# Patient Record
Sex: Male | Born: 1945 | Race: White | Hispanic: No | Smoking: Former smoker
Health system: Southern US, Community
[De-identification: ages and names within clinical notes are randomized; demographics above are authoritative.]

## PROBLEM LIST (undated history)

## (undated) DIAGNOSIS — K219 Gastro-esophageal reflux disease without esophagitis: Secondary | ICD-10-CM

## (undated) DIAGNOSIS — I82409 Acute embolism and thrombosis of unspecified deep veins of unspecified lower extremity: Secondary | ICD-10-CM

## (undated) DIAGNOSIS — K648 Other hemorrhoids: Secondary | ICD-10-CM

## (undated) DIAGNOSIS — S9781XA Crushing injury of right foot, initial encounter: Secondary | ICD-10-CM

## (undated) DIAGNOSIS — I10 Essential (primary) hypertension: Secondary | ICD-10-CM

## (undated) DIAGNOSIS — R42 Dizziness and giddiness: Secondary | ICD-10-CM

## (undated) DIAGNOSIS — Z8616 Personal history of COVID-19: Secondary | ICD-10-CM

## (undated) DIAGNOSIS — G500 Trigeminal neuralgia: Secondary | ICD-10-CM

## (undated) DIAGNOSIS — C4492 Squamous cell carcinoma of skin, unspecified: Secondary | ICD-10-CM

## (undated) DIAGNOSIS — Z973 Presence of spectacles and contact lenses: Secondary | ICD-10-CM

## (undated) DIAGNOSIS — Z9889 Other specified postprocedural states: Secondary | ICD-10-CM

## (undated) DIAGNOSIS — C4491 Basal cell carcinoma of skin, unspecified: Secondary | ICD-10-CM

## (undated) DIAGNOSIS — D649 Anemia, unspecified: Secondary | ICD-10-CM

## (undated) DIAGNOSIS — K5790 Diverticulosis of intestine, part unspecified, without perforation or abscess without bleeding: Secondary | ICD-10-CM

## (undated) DIAGNOSIS — S62101A Fracture of unspecified carpal bone, right wrist, initial encounter for closed fracture: Secondary | ICD-10-CM

## (undated) DIAGNOSIS — R112 Nausea with vomiting, unspecified: Secondary | ICD-10-CM

## (undated) HISTORY — PX: COLONOSCOPY: SHX174

## (undated) HISTORY — DX: Gastro-esophageal reflux disease without esophagitis: K21.9

## (undated) HISTORY — PX: VENOGRAM: SHX6165

## (undated) HISTORY — DX: Squamous cell carcinoma of skin, unspecified: C44.92

## (undated) HISTORY — PX: ESOPHAGOGASTRODUODENOSCOPY: SHX1529

## (undated) HISTORY — DX: Trigeminal neuralgia: G50.0

## (undated) HISTORY — DX: Other hemorrhoids: K64.8

## (undated) HISTORY — DX: Basal cell carcinoma of skin, unspecified: C44.91

## (undated) HISTORY — PX: UPPER GASTROINTESTINAL ENDOSCOPY: SHX188

## (undated) HISTORY — DX: Fracture of unspecified carpal bone, right wrist, initial encounter for closed fracture: S62.101A

## (undated) HISTORY — PX: WRIST SURGERY: SHX841

## (undated) HISTORY — DX: Acute embolism and thrombosis of unspecified deep veins of unspecified lower extremity: I82.409

## (undated) HISTORY — DX: Crushing injury of right foot, initial encounter: S97.81XA

## (undated) HISTORY — DX: Personal history of COVID-19: Z86.16

## (undated) HISTORY — PX: REPAIR ANKLE LIGAMENT: SUR1187

## (undated) HISTORY — DX: Essential (primary) hypertension: I10

## (undated) HISTORY — DX: Diverticulosis of intestine, part unspecified, without perforation or abscess without bleeding: K57.90

## (undated) HISTORY — PX: POLYPECTOMY: SHX149

---
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
## 1992-04-05 DIAGNOSIS — K649 Unspecified hemorrhoids: Secondary | ICD-10-CM | POA: Insufficient documentation

## 1996-04-05 DIAGNOSIS — G43009 Migraine without aura, not intractable, without status migrainosus: Secondary | ICD-10-CM | POA: Insufficient documentation

## 2000-03-25 DIAGNOSIS — S9781XA Crushing injury of right foot, initial encounter: Secondary | ICD-10-CM

## 2000-03-25 HISTORY — DX: Crushing injury of right foot, initial encounter: S97.81XA

## 2000-04-05 DIAGNOSIS — S62101A Fracture of unspecified carpal bone, right wrist, initial encounter for closed fracture: Secondary | ICD-10-CM

## 2000-04-05 HISTORY — DX: Fracture of unspecified carpal bone, right wrist, initial encounter for closed fracture: S62.101A

## 2000-04-21 ENCOUNTER — Encounter: Admission: RE | Admit: 2000-04-21 | Discharge: 2000-04-21 | Payer: Self-pay | Admitting: Occupational Medicine

## 2000-04-21 ENCOUNTER — Encounter: Payer: Self-pay | Admitting: Occupational Medicine

## 2000-09-25 ENCOUNTER — Observation Stay (HOSPITAL_COMMUNITY): Admission: AC | Admit: 2000-09-25 | Discharge: 2000-09-26 | Payer: Self-pay

## 2000-09-25 ENCOUNTER — Encounter: Payer: Self-pay | Admitting: Orthopedic Surgery

## 2000-09-29 ENCOUNTER — Observation Stay (HOSPITAL_COMMUNITY): Admission: RE | Admit: 2000-09-29 | Discharge: 2000-09-30 | Payer: Self-pay | Admitting: Orthopedic Surgery

## 2000-09-29 ENCOUNTER — Encounter: Payer: Self-pay | Admitting: Orthopedic Surgery

## 2002-08-27 LAB — HM COLONOSCOPY

## 2003-12-05 ENCOUNTER — Encounter: Payer: Self-pay | Admitting: Family Medicine

## 2003-12-05 LAB — CONVERTED CEMR LAB: PSA: 1.9 ng/mL

## 2004-02-24 ENCOUNTER — Ambulatory Visit: Payer: Self-pay | Admitting: Family Medicine

## 2004-05-26 ENCOUNTER — Ambulatory Visit: Payer: Self-pay | Admitting: Family Medicine

## 2004-07-23 ENCOUNTER — Ambulatory Visit: Payer: Self-pay | Admitting: Family Medicine

## 2004-07-29 ENCOUNTER — Ambulatory Visit: Payer: Self-pay | Admitting: Family Medicine

## 2004-12-04 ENCOUNTER — Encounter: Payer: Self-pay | Admitting: Family Medicine

## 2004-12-04 LAB — CONVERTED CEMR LAB: PSA: 1.91 ng/mL

## 2004-12-21 ENCOUNTER — Ambulatory Visit: Payer: Self-pay | Admitting: Family Medicine

## 2004-12-21 LAB — CONVERTED CEMR LAB
RBC count: 4.9 10*6/mm3
WBC, blood: 4.2 10*3/mm3

## 2004-12-22 ENCOUNTER — Ambulatory Visit: Payer: Self-pay | Admitting: Family Medicine

## 2005-01-01 ENCOUNTER — Ambulatory Visit: Payer: Self-pay | Admitting: Family Medicine

## 2005-01-21 ENCOUNTER — Inpatient Hospital Stay (HOSPITAL_COMMUNITY): Admission: EM | Admit: 2005-01-21 | Discharge: 2005-01-23 | Payer: Self-pay | Admitting: Emergency Medicine

## 2005-02-04 ENCOUNTER — Inpatient Hospital Stay (HOSPITAL_COMMUNITY): Admission: RE | Admit: 2005-02-04 | Discharge: 2005-02-06 | Payer: Self-pay | Admitting: Orthopedic Surgery

## 2005-06-09 ENCOUNTER — Ambulatory Visit: Payer: Self-pay | Admitting: Family Medicine

## 2005-07-01 ENCOUNTER — Ambulatory Visit: Payer: Self-pay | Admitting: Family Medicine

## 2005-07-03 ENCOUNTER — Emergency Department (HOSPITAL_COMMUNITY): Admission: EM | Admit: 2005-07-03 | Discharge: 2005-07-03 | Payer: Self-pay | Admitting: Emergency Medicine

## 2006-01-17 ENCOUNTER — Ambulatory Visit: Payer: Self-pay | Admitting: Family Medicine

## 2006-01-17 LAB — CONVERTED CEMR LAB
PSA: 2.03 ng/mL
TSH: 1.9 microintl units/mL

## 2006-01-19 ENCOUNTER — Ambulatory Visit: Payer: Self-pay | Admitting: Family Medicine

## 2006-01-24 ENCOUNTER — Ambulatory Visit: Payer: Self-pay | Admitting: Family Medicine

## 2006-01-27 ENCOUNTER — Ambulatory Visit: Payer: Self-pay | Admitting: Family Medicine

## 2006-01-31 ENCOUNTER — Ambulatory Visit: Payer: Self-pay | Admitting: Family Medicine

## 2006-02-03 ENCOUNTER — Ambulatory Visit: Payer: Self-pay | Admitting: Family Medicine

## 2006-02-04 ENCOUNTER — Ambulatory Visit: Payer: Self-pay | Admitting: Family Medicine

## 2006-02-16 ENCOUNTER — Ambulatory Visit: Payer: Self-pay | Admitting: Family Medicine

## 2006-03-02 ENCOUNTER — Ambulatory Visit: Payer: Self-pay | Admitting: Family Medicine

## 2006-03-25 ENCOUNTER — Ambulatory Visit: Payer: Self-pay | Admitting: Family Medicine

## 2006-06-24 ENCOUNTER — Encounter: Payer: Self-pay | Admitting: Family Medicine

## 2006-06-24 DIAGNOSIS — H9190 Unspecified hearing loss, unspecified ear: Secondary | ICD-10-CM | POA: Insufficient documentation

## 2006-06-24 DIAGNOSIS — G250 Essential tremor: Secondary | ICD-10-CM | POA: Insufficient documentation

## 2006-06-24 DIAGNOSIS — B009 Herpesviral infection, unspecified: Secondary | ICD-10-CM | POA: Insufficient documentation

## 2006-06-24 DIAGNOSIS — G252 Other specified forms of tremor: Secondary | ICD-10-CM | POA: Insufficient documentation

## 2006-06-24 DIAGNOSIS — K219 Gastro-esophageal reflux disease without esophagitis: Secondary | ICD-10-CM | POA: Insufficient documentation

## 2006-06-24 DIAGNOSIS — I10 Essential (primary) hypertension: Secondary | ICD-10-CM | POA: Insufficient documentation

## 2006-06-24 DIAGNOSIS — H81399 Other peripheral vertigo, unspecified ear: Secondary | ICD-10-CM | POA: Insufficient documentation

## 2006-12-12 ENCOUNTER — Ambulatory Visit: Payer: Self-pay | Admitting: Gastroenterology

## 2006-12-26 ENCOUNTER — Encounter: Payer: Self-pay | Admitting: Family Medicine

## 2006-12-26 ENCOUNTER — Encounter (INDEPENDENT_AMBULATORY_CARE_PROVIDER_SITE_OTHER): Payer: Self-pay | Admitting: *Deleted

## 2006-12-26 ENCOUNTER — Encounter: Payer: Self-pay | Admitting: Gastroenterology

## 2006-12-26 ENCOUNTER — Ambulatory Visit: Payer: Self-pay | Admitting: Gastroenterology

## 2006-12-26 DIAGNOSIS — K5730 Diverticulosis of large intestine without perforation or abscess without bleeding: Secondary | ICD-10-CM | POA: Insufficient documentation

## 2007-01-10 ENCOUNTER — Encounter: Payer: Self-pay | Admitting: Family Medicine

## 2007-01-10 DIAGNOSIS — K620 Anal polyp: Secondary | ICD-10-CM | POA: Insufficient documentation

## 2007-01-10 DIAGNOSIS — K621 Rectal polyp: Secondary | ICD-10-CM

## 2007-01-27 ENCOUNTER — Ambulatory Visit: Payer: Self-pay | Admitting: Family Medicine

## 2007-01-30 LAB — CONVERTED CEMR LAB
ALT: 25 units/L (ref 0–53)
AST: 20 units/L (ref 0–37)
Albumin: 4.3 g/dL (ref 3.5–5.2)
Alkaline Phosphatase: 60 units/L (ref 39–117)
BUN: 16 mg/dL (ref 6–23)
Bilirubin, Direct: 0.2 mg/dL (ref 0.0–0.3)
CO2: 29 meq/L (ref 19–32)
Calcium: 9.6 mg/dL (ref 8.4–10.5)
Chloride: 107 meq/L (ref 96–112)
Cholesterol: 212 mg/dL (ref 0–200)
Creatinine, Ser: 1 mg/dL (ref 0.4–1.5)
Direct LDL: 140.1 mg/dL
GFR calc Af Amer: 98 mL/min
GFR calc non Af Amer: 81 mL/min
Glucose, Bld: 103 mg/dL — ABNORMAL HIGH (ref 70–99)
HDL: 59.3 mg/dL (ref >39.0)
PSA: 2.45 ng/mL (ref 0.10–4.00)
Potassium: 4.7 meq/L (ref 3.5–5.1)
Sodium: 141 meq/L (ref 135–145)
TSH: 1.62 microintl units/mL (ref 0.35–5.50)
Total Bilirubin: 1.1 mg/dL (ref 0.3–1.2)
Total CHOL/HDL Ratio: 3.6
Total Protein: 6.9 g/dL (ref 6.0–8.3)
Triglycerides: 46 mg/dL (ref 0–149)
VLDL: 9 mg/dL (ref 0–40)

## 2007-01-31 ENCOUNTER — Ambulatory Visit: Payer: Self-pay | Admitting: Family Medicine

## 2007-01-31 DIAGNOSIS — E785 Hyperlipidemia, unspecified: Secondary | ICD-10-CM | POA: Insufficient documentation

## 2007-02-21 ENCOUNTER — Ambulatory Visit: Payer: Self-pay | Admitting: Family Medicine

## 2007-02-22 ENCOUNTER — Encounter (INDEPENDENT_AMBULATORY_CARE_PROVIDER_SITE_OTHER): Payer: Self-pay | Admitting: *Deleted

## 2007-05-02 ENCOUNTER — Ambulatory Visit: Payer: Self-pay | Admitting: Family Medicine

## 2007-05-02 LAB — CONVERTED CEMR LAB
BUN: 14 mg/dL (ref 6–23)
CO2: 32 meq/L (ref 19–32)
Calcium: 9.4 mg/dL (ref 8.4–10.5)
Chloride: 106 meq/L (ref 96–112)
Creatinine, Ser: 1.1 mg/dL (ref 0.4–1.5)
GFR calc Af Amer: 88 mL/min
GFR calc non Af Amer: 73 mL/min
Glucose, Bld: 98 mg/dL (ref 70–99)
Potassium: 4.3 meq/L (ref 3.5–5.1)
Sodium: 141 meq/L (ref 135–145)

## 2007-05-04 ENCOUNTER — Ambulatory Visit: Payer: Self-pay | Admitting: Family Medicine

## 2007-11-15 ENCOUNTER — Ambulatory Visit: Payer: Self-pay | Admitting: Internal Medicine

## 2007-11-23 ENCOUNTER — Ambulatory Visit: Payer: Self-pay | Admitting: Family Medicine

## 2008-02-05 ENCOUNTER — Ambulatory Visit: Payer: Self-pay | Admitting: Family Medicine

## 2008-02-05 LAB — CONVERTED CEMR LAB
ALT: 25 units/L (ref 0–53)
AST: 24 units/L (ref 0–37)
Albumin: 4.1 g/dL (ref 3.5–5.2)
Alkaline Phosphatase: 48 units/L (ref 39–117)
BUN: 18 mg/dL (ref 6–23)
Basophils Absolute: 0 10*3/uL (ref 0.0–0.1)
Basophils Relative: 1.1 % (ref 0.0–3.0)
Bilirubin, Direct: 0.2 mg/dL (ref 0.0–0.3)
CO2: 30 meq/L (ref 19–32)
Calcium: 9.1 mg/dL (ref 8.4–10.5)
Chloride: 108 meq/L (ref 96–112)
Cholesterol: 174 mg/dL (ref 0–200)
Creatinine, Ser: 1.1 mg/dL (ref 0.4–1.5)
Eosinophils Absolute: 0.2 10*3/uL (ref 0.0–0.7)
Eosinophils Relative: 5.9 % — ABNORMAL HIGH (ref 0.0–5.0)
GFR calc Af Amer: 88 mL/min
GFR calc non Af Amer: 73 mL/min
Glucose, Bld: 106 mg/dL — ABNORMAL HIGH (ref 70–99)
HCT: 43.4 % (ref 39.0–52.0)
HDL: 55.1 mg/dL (ref >39.0)
Hemoglobin: 15.1 g/dL (ref 13.0–17.0)
Iron: 85 ug/dL (ref 42–165)
LDL Cholesterol: 111 mg/dL — ABNORMAL HIGH (ref 0–99)
Lymphocytes Relative: 31.9 % (ref 12.0–46.0)
MCHC: 34.9 g/dL (ref 30.0–36.0)
MCV: 85.3 fL (ref 78.0–100.0)
Monocytes Absolute: 0.4 10*3/uL (ref 0.1–1.0)
Monocytes Relative: 10 % (ref 3.0–12.0)
Neutro Abs: 2.3 10*3/uL (ref 1.4–7.7)
Neutrophils Relative %: 51.1 % (ref 43.0–77.0)
PSA: 2.43 ng/mL (ref 0.10–4.00)
Platelets: 164 10*3/uL (ref 150–400)
Potassium: 4.4 meq/L (ref 3.5–5.1)
RBC: 5.09 M/uL (ref 4.22–5.81)
RDW: 12.4 % (ref 11.5–14.6)
Sodium: 144 meq/L (ref 135–145)
TSH: 1.21 microintl units/mL (ref 0.35–5.50)
Total Bilirubin: 1.1 mg/dL (ref 0.3–1.2)
Total CHOL/HDL Ratio: 3.2
Total Protein: 6.3 g/dL (ref 6.0–8.3)
Triglycerides: 41 mg/dL (ref 0–149)
VLDL: 8 mg/dL (ref 0–40)
WBC: 4.2 10*3/uL — ABNORMAL LOW (ref 4.5–10.5)

## 2008-02-07 ENCOUNTER — Ambulatory Visit: Payer: Self-pay | Admitting: Family Medicine

## 2008-02-13 ENCOUNTER — Ambulatory Visit: Payer: Self-pay | Admitting: Family Medicine

## 2008-04-07 ENCOUNTER — Emergency Department (HOSPITAL_COMMUNITY): Admission: EM | Admit: 2008-04-07 | Discharge: 2008-04-07 | Payer: Self-pay | Admitting: Emergency Medicine

## 2008-05-07 ENCOUNTER — Ambulatory Visit: Payer: Self-pay | Admitting: Family Medicine

## 2008-12-10 ENCOUNTER — Ambulatory Visit: Payer: Self-pay | Admitting: Family Medicine

## 2008-12-10 DIAGNOSIS — K921 Melena: Secondary | ICD-10-CM | POA: Insufficient documentation

## 2009-02-07 ENCOUNTER — Ambulatory Visit: Payer: Self-pay | Admitting: Family Medicine

## 2009-02-07 LAB — CONVERTED CEMR LAB
ALT: 26 units/L (ref 0–53)
AST: 23 units/L (ref 0–37)
Albumin: 4.3 g/dL (ref 3.5–5.2)
Alkaline Phosphatase: 54 units/L (ref 39–117)
BUN: 12 mg/dL (ref 6–23)
Basophils Absolute: 0 10*3/uL (ref 0.0–0.1)
Basophils Relative: 0.8 % (ref 0.0–3.0)
Bilirubin, Direct: 0.1 mg/dL (ref 0.0–0.3)
CO2: 27 meq/L (ref 19–32)
Calcium: 9.1 mg/dL (ref 8.4–10.5)
Chloride: 111 meq/L (ref 96–112)
Cholesterol: 191 mg/dL (ref 0–200)
Creatinine, Ser: 1 mg/dL (ref 0.4–1.5)
Eosinophils Absolute: 0.2 10*3/uL (ref 0.0–0.7)
Eosinophils Relative: 6.6 % — ABNORMAL HIGH (ref 0.0–5.0)
GFR calc non Af Amer: 80.54 mL/min (ref >60)
Glucose, Bld: 97 mg/dL (ref 70–99)
HCT: 43.6 % (ref 39.0–52.0)
HDL: 64.6 mg/dL (ref >39.00)
Hemoglobin: 15.1 g/dL (ref 13.0–17.0)
LDL Cholesterol: 117 mg/dL — ABNORMAL HIGH (ref 0–99)
Lymphocytes Relative: 29.7 % (ref 12.0–46.0)
Lymphs Abs: 1.1 10*3/uL (ref 0.7–4.0)
MCHC: 34.6 g/dL (ref 30.0–36.0)
MCV: 90.3 fL (ref 78.0–100.0)
Monocytes Absolute: 0.3 10*3/uL (ref 0.1–1.0)
Monocytes Relative: 9.7 % (ref 3.0–12.0)
Neutro Abs: 2 10*3/uL (ref 1.4–7.7)
Neutrophils Relative %: 53.2 % (ref 43.0–77.0)
PSA: 2.46 ng/mL (ref 0.10–4.00)
Platelets: 159 10*3/uL (ref 150.0–400.0)
Potassium: 4.5 meq/L (ref 3.5–5.1)
RBC: 4.82 M/uL (ref 4.22–5.81)
RDW: 12.2 % (ref 11.5–14.6)
Sodium: 145 meq/L (ref 135–145)
TSH: 1.47 microintl units/mL (ref 0.35–5.50)
Total Bilirubin: 1.2 mg/dL (ref 0.3–1.2)
Total CHOL/HDL Ratio: 3
Total Protein: 6.8 g/dL (ref 6.0–8.3)
Triglycerides: 46 mg/dL (ref 0.0–149.0)
VLDL: 9.2 mg/dL (ref 0.0–40.0)
WBC: 3.6 10*3/uL — ABNORMAL LOW (ref 4.5–10.5)

## 2009-02-09 LAB — CONVERTED CEMR LAB: Vit D, 25-Hydroxy: 39 ng/mL (ref 30–89)

## 2009-02-10 ENCOUNTER — Ambulatory Visit: Payer: Self-pay | Admitting: Family Medicine

## 2009-02-26 ENCOUNTER — Ambulatory Visit: Payer: Self-pay | Admitting: Family Medicine

## 2009-02-26 LAB — CONVERTED CEMR LAB
OCCULT 1: NEGATIVE
OCCULT 2: NEGATIVE
OCCULT 3: NEGATIVE

## 2009-11-10 ENCOUNTER — Encounter (INDEPENDENT_AMBULATORY_CARE_PROVIDER_SITE_OTHER): Payer: Self-pay | Admitting: *Deleted

## 2010-02-13 ENCOUNTER — Telehealth (INDEPENDENT_AMBULATORY_CARE_PROVIDER_SITE_OTHER): Payer: Self-pay | Admitting: *Deleted

## 2010-02-17 ENCOUNTER — Ambulatory Visit: Payer: Self-pay | Admitting: Family Medicine

## 2010-02-18 LAB — CONVERTED CEMR LAB
ALT: 20 units/L (ref 0–53)
AST: 20 units/L (ref 0–37)
Albumin: 4.4 g/dL (ref 3.5–5.2)
Alkaline Phosphatase: 61 units/L (ref 39–117)
BUN: 19 mg/dL (ref 6–23)
Bilirubin, Direct: 0.1 mg/dL (ref 0.0–0.3)
CO2: 30 meq/L (ref 19–32)
Calcium: 9.3 mg/dL (ref 8.4–10.5)
Chloride: 106 meq/L (ref 96–112)
Cholesterol: 200 mg/dL (ref 0–200)
Creatinine, Ser: 1 mg/dL (ref 0.4–1.5)
GFR calc non Af Amer: 78.46 mL/min (ref >60)
Glucose, Bld: 99 mg/dL (ref 70–99)
HDL: 61 mg/dL (ref >39.00)
LDL Cholesterol: 127 mg/dL — ABNORMAL HIGH (ref 0–99)
PSA: 3.19 ng/mL (ref 0.10–4.00)
Potassium: 4.6 meq/L (ref 3.5–5.1)
Sodium: 140 meq/L (ref 135–145)
Total Bilirubin: 1 mg/dL (ref 0.3–1.2)
Total CHOL/HDL Ratio: 3
Total Protein: 6.7 g/dL (ref 6.0–8.3)
Triglycerides: 61 mg/dL (ref 0.0–149.0)
VLDL: 12.2 mg/dL (ref 0.0–40.0)

## 2010-02-24 ENCOUNTER — Ambulatory Visit: Payer: Self-pay | Admitting: Family Medicine

## 2010-02-24 DIAGNOSIS — R6884 Jaw pain: Secondary | ICD-10-CM | POA: Insufficient documentation

## 2010-02-24 LAB — CONVERTED CEMR LAB
Cholesterol, target level: 200 mg/dL
HDL goal, serum: 40 mg/dL
LDL Goal: 160 mg/dL
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
## 2010-05-05 NOTE — Letter (Signed)
Summary: Nadara Eaton letter  [place] at [facility]  [address] [place], [place] [address]   Phone: [phone]  Fax: [phone]       11/10/2009 MRN: [id number]  LOEL BETANCUR [address] [place], [place]  [address]  Dear Mr. Judithann Sheen [facility] [place], and [facility] announce the retirement of Arta Silence, M.D., from full-time practice at the [facility] office effective October 02, 2009 and his plans of returning part-time.  It is important to Dr. Hetty Ely and to our practice that you understand that [facility] has seven physicians in our office for your health care needs.  We will continue to offer the same exceptional care that you have today.    Dr. Hetty Ely has spoken to many of you about his plans for retirement and returning part-time in the fall.   We will continue to work with you through the transition to schedule appointments for you in the office and meet the high standards that [facility] is committed to.   Again, it is with great pleasure that we share the news that Dr. Hetty Ely will return to [facility] at [facility] in October of 2011 with a reduced schedule.    If you have any questions, or would like to request an appointment with one of our physicians, please call us at [phone] and press the option for Scheduling an appointment.  We take pleasure in providing you with excellent patient care and look forward to seeing you at your next office visit.  Our [facility] Physicians are:  Tillman Abide, M.D. Laurita Quint, M.D. Roxy Manns, M.D. Kerby Nora, M.D. Hannah Beat, M.D. Ruthe Mannan, M.D. We proudly welcomed Raechel Ache, M.D. and Eustaquio Boyden, M.D. to the practice in July/August 2011.  Sincerely,   [facility]

## 2010-05-05 NOTE — Progress Notes (Signed)
----   Converted from flag ---- ---- 02/12/2010 1:51 PM, Crawford Givens MD wrote: psa v76.44 cmet/lipid 401.1  ---- 02/12/2010 1:49 PM, Liane Comber CMA (AAMA) wrote: Lab orders please! Good Morning! This pt is scheduled for cpx labs Tuesday, which labs to draw and dx codes to use? Thanks Tasha ------------------------------

## 2010-08-21 NOTE — Consult Note (Signed)
NAMETHANOS, COUSINEAU NO.:  [id number]   MEDICAL RECORD NO.:  [id number]          PATIENT TYPE:  INP   LOCATION:  6707                         FACILITY:  [facility]   PHYSICIAN:  Doralee Albino. Carola Frost, M.D. DATE OF BIRTH:  1945-12-13   DATE OF CONSULTATION:  01/21/2005  DATE OF DISCHARGE:                                   CONSULTATION   REQUESTING PHYSICIAN:  Patsy Lager, M.D.   REASON FOR CONSULTATION:  Right pilon fracture with associated fibular  fracture.   BRIEF HISTORY OF PRESENTATION:  Mr. Basaldua is a 66 year old white male who  fell about 12 feet off a ladder while working on his home which is under  Holiday representative today. He had immediate onset of pain in the right ankle. He  denied any other injuries or any numbness or tingling associated with this.  There was no break in the skin according to the previous workup by Dr.  Ranell Patrick' physician assistant. He specifically denied back and hip pain. His  pain is moderate at this time. He did not have any particular pain with  passive stretch.   PAST MEDICAL HISTORY:  Hypertension.   PAST SURGICAL HISTORY:  Wrist fracture repair by Dr. Amanda Pea.   MEDICATIONS:  Diovan.   ALLERGIES:  CODEINE.   REVIEW OF SYSTEMS:  Complete review of systems was reviewed. That was  included in his chart.   FAMILY MEDICAL HISTORY:  Noncontributory.   SOCIAL HISTORY:  The patient denies smoking. He is currently retired but  actively engaged in many projects. He presents with his wife.   PHYSICAL EXAMINATION:  Mr. Patty appears comfortable. He is not in any  distress. He is conversant, alert and oriented. Vital signs are stable. He  does not have any tenderness to palpation of his pelvic girdle. No  tenderness about the hips or knees and no associated swelling or blocks to  motion of the knees bilaterally. Similarly, his left heel and ankle without  focal swelling, instability or blocked motion. The right ankle has been  immobilized in a bulky Jones dressing and is elevated above his heart. The  patient is able to demonstrate active flexion and extension of the great and  lesser toes. He did not have any significant pain with passive stretch.  Capillary refill is 2 seconds and brisk.   Three views of the left ankle demonstrate impacted pilon fracture and  associated fibular fracture. The fibular fracture is transverse, shortened 3  mm or so and translated about half the shaft posteriorly. The tibial plafond  has been fractured with anterior displacement of the anterolateral fragment,  disruption of the syndesmosis and displacement of medial malleolus. There  are multiple comminuted interarticular fragments, but on the whole, no other  major fracture lines are identified up. These findings were well visualized  on review of multiple CT scan images.   ASSESSMENT:  1.  Right pilon and fibular fracture.  2.  Ruptured syndesmosis.  3.  No evidence of other injury at this time.   PLAN:  I agree wholeheartedly with Dr. Ranell Patrick' plan to admit Mr.  Stucki  overnight for observation and elevation of his extremity, and pain control.  Should it be sufficiently controlled on all narcotics, discharge is  anticipated tomorrow. I also have counseled him regarding the possibility of  thromboembolic disease and complications and have recommended proceeding  with Lovenox if this is okay with Dr. Ranell Patrick. I have gone ahead and written  him a prescription for 20 days that should facilitate both the time period  presurgery as well as postoperative. I will see him back in clinic next  week. We will discuss surgery further at that time if his soft tissues  allow. He will of course maintain nonweightbearing and elevation of the  extremity pending the opportunity for surgical fixation. At this time, I  would anticipate surgery to entail anterolateral plate and probably a  posterior plating of the fibula with separate internal  fixation of the  medial malleolus. I have discussed this as well as the possible  complications with the patient including skin breakdown, infection and  arthritis. We will discuss these in more detail when he returns to clinic.      Doralee Albino. Carola Frost, M.D.  Electronically Signed     MHH/MEDQ  D:  01/21/2005  T:  01/22/2005  Job:  [id number]

## 2010-08-21 NOTE — Discharge Summary (Signed)
NAMEGERRY, [name] NO.:  [id number]   MEDICAL RECORD NO.:  [id number]          PATIENT TYPE:  INP   LOCATION:  6707                         FACILITY:  [facility]   PHYSICIAN:  Almedia Balls. Ranell Patrick, M.D. DATE OF BIRTH:  06-26-1945   DATE OF ADMISSION:  01/21/2005  DATE OF DISCHARGE:  01/23/2005                                 DISCHARGE SUMMARY   ADMISSION DIAGNOSIS:  Right ankle pilon fracture.   DISCHARGE DIAGNOSIS:  Right ankle pilon fracture.   PROCEDURE PERFORMED:  Closed reduction and application of short-leg splint.   CONSULTING SERVICES:  Physical therapy and discharge planning.   HISTORY OF PRESENT ILLNESS:  The patient is a 65 year old male with a  history of a fall, injuring his ankle. The patient had radiographs  demonstrating a displaced pilon fracture. The patient is admitted for  further evaluation and treatment of that injury. For further details of the  patient's past medical history and physical examination please see the  medical record.   HOSPITAL COURSE:  The patient was admitted for pain control to the  orthopedic surgery service. He had a CT scan documenting displacement of a  right pilon fracture. This fracture will require operative stabilization.  The patient was informed. I discussed this case with Dr. Myrene Galas,  trauma specialist, who had agreed to take the patient on. Mr. Decoste had  an uncomplicated hospital course and he was given crutches and was  instructed to be nonweight bearing, to elevate his foot. He was given pain  medication. He will be discharged to home, to follow up with Dr. Carola Frost on  this coming Wednesday. The patient will begin to keep his ankle elevated and  will remain nonweight bearing.           ______________________________  Almedia Balls. Ranell Patrick, M.D.     SRN/MEDQ  D:  03/15/2005  T:  03/15/2005  Job:  [id number]

## 2010-08-21 NOTE — Discharge Summary (Signed)
NAMEDERRAL, COLUCCI NO.:  [id number]   MEDICAL RECORD NO.:  [id number]          PATIENT TYPE:  INP   LOCATION:  5006                         FACILITY:  [facility]   PHYSICIAN:  Doralee Albino. Carola Frost, M.D. DATE OF BIRTH:  19-Feb-1946   DATE OF ADMISSION:  02/04/2005  DATE OF DISCHARGE:  02/06/2005                                 DISCHARGE SUMMARY   ADMISSION DIAGNOSIS:  Right ankle pilon, fibula, and syndesmotic fracture.   DISCHARGE DIAGNOSIS:  Status post open reduction and internal fixation of  right ankle pilon, fibula, syndesmosis and talus osteochondral fracture.   PROCEDURE:  On February 04, 2005, ORIF of right ankle pilon, fibula, and  syndesmosis fracture and talus osteochondral fracture.   HISTORY OF PRESENT ILLNESS:  The patient was on a ladder and fell  approximately 12 feet, landing on his right lower extremity, sustaining a  severe injury to that extremity.  He underwent initial closed reduction and  splinting, with subsequent need for time for swelling to decrease to do  definitive open reduction and internal fixation on this patient.   PHYSICAL EXAMINATION ON ADMISSION:  GENERAL:  This is a well-appearing, well-  developed 65 year old white male in no acute distress.  HEENT:  Pupils equal, round, reactive to light and accommodation.  Extraocular movements intact.  Atraumatic, normocephalic.  NECK:  Supple.  CHEST:  Lung clear to auscultation bilaterally.  CARDIAC:  Regular rate and rhythm.  Normal S1 and S2.  ABDOMEN:  Positive bowel sounds.  Nontender and soft.  EXTREMITIES:  Distal neurovascular exam is intact.  Splint is in place in  the right lower extremity.  He does have 1+ edema.  SKIN:  Warm and dry.   HOSPITAL COURSE:  The patient underwent open reduction and internal fixation  on February 04, 2005 without complications.  On February 05, 2005, the patient  is seen and evaluated.  The patient did receive a block for his surgery.  This appears to  still be intact.  The patient has motor in his right lower  extremity; however, he has decreased sensory in the right lower extremity.  He is afebrile and vital signs are stable.  Splint is in place on  postoperative day #1.  Ace wrap in place.   Discharge planning is initiated today.  Anticipate discharge in the a.m. of  February 06, 2005.   DISPOSITION:  Discharged to home.   CONDITION ON DISCHARGE:  Stable.   DISCHARGE MEDICATIONS:  1.  Percocet.  2.  Oxycodone.  3.  Phenergan.  4.  Robaxin.  5.  Lovenox.   FOLLOW UP:  The patient is to follow up with Dr. Myrene Galas in  approximately 7-10 days.  Please call [phone] for appointment.  Please call  in the interim for any questions regarding this injury or concerns regarding  his right lower extremity.      Cecil Cranker, PA      Doralee Albino. Carola Frost, M.D.  Electronically Signed    RWC/MEDQ  D:  02/05/2005  T:  02/05/2005  Job:  [id number]

## 2010-08-21 NOTE — Op Note (Signed)
[name], [name]   PHYSICIAN:  Doralee Albino. Carola Frost, M.D. DATE OF BIRTH:  April 09, 1945   DATE OF PROCEDURE:  02/04/2005  DATE OF DISCHARGE:                                 OPERATIVE REPORT   PREOPERATIVE DIAGNOSES:  1.  Right tibial Pilon fracture.  2.  Right fibular fracture.  3.  Right ankle syndesmosis disruption.   POSTOPERATIVE DIAGNOSES:  1.  Right tibial Pilon fracture.  2.  Right fibular fracture.  3.  Right ankle syndesmosis disruption.   PROCEDURES:  1.  Open reduction and internal fixation, tibial plafond and fibula.  2.  Open reduction and internal fixation, syndesmosis.  3.  Open treatment of talus osteochondral fracture using drilling, partial      excision and microfracture.   SURGEON:  Myrene Galas, MD   ASSISTANT:  Peter Garter, PA   ANESTHESIA:  General.   COMPLICATIONS:  None.   TOURNIQUET:  None.   ESTIMATED BLOOD LOSS:  120 mL.   DRAINS:  One medium Hemovac.   DISPOSITION:  To PACU.   CONDITION:  Stable.   BRIEF SUMMARY OF INDICATION FOR PROCEDURE:  [name] is a very pleasant  a 65 year old male who sustained a high-energy right tibial Pilon fracture  and fibular fracture with a disruption of his syndesmosis on October19,  2006.  Since then, he has been temporized in a splint, allowing his soft  tissue swelling to resolve.  We recommended operative treatment of this  injury, given the disruption of the articular surface.  After a full  discussion of the risks and benefits of surgery, the patient wished to  proceed.  He understood those risks to include arthritis, nerve injury,  vessel injury, malunion, nonunion, infection, thromboembolic complications,  as well as the possible need for further surgery in the event of one of  these complications.   BRIEF DESCRIPTION OF  PROCEDURE:  [name] was administered preoperative  antibiotics and taken to the operating room, where general anesthesia was  induced and his right lower extremity prepped and draped in the usual  sterile fashion.  A tourniquet was placed about the thigh, but never  inflated during the case.  A 7-cm anterolateral incision was then made and  dissection carried carefully through the soft tissue superficially, where  the superficial peroneal nerve was identified and retracted medially.  Dissection continued deep, where the anterolateral fragment was palpable.  The periosteum was incised directly over the fracture and the fracture  fragments mobilized.  We could see immediately that we would have difficulty  bringing these fracture fragments down in spite of the partially articular  nature of the fracture.  A 0.62 K-wire was then driven through the calcaneus  from medial to lateral and a tension bow used to apply distraction.  The  fracture site was then opened with a lamina spreader and adherent hematoma  and clot removed.  There was a large osteochondral fracture then visible on  the talus involving the anterolateral dome.  A portion of the cartilage was  debrided sharply with a knife and then the exposed bone drilled several  times with the K-wire using the microfracture-type technique.  After  treatment of this osteochondral fracture, further debridement of the joint  was performed using bulb syringe and a tonsil to remove other fibrous  debris.   We were careful to debride the posterior aspect of the joint, given the CT  scan; this involved plantarflexion and removal with the Kocher knife.  After  removing these fragments, no other loose chondral flaps were visible on the  talus and the lesion appeared well-prepared for future healing.  The  periosteum was left attached distally to the anterolateral fragment.  We  then attempted to reduce this segment, but were unable to do so with  a  persistent distal lateral gap; consequently, the elevator had to be split  between the fibula and this segment, as it had healed in this distracted  position partially to the anterior aspect of the fibula.  Once this was  done, we were able to close this gap down fairly well to the articular  surface, there remained some minimal bone loss there which measured a couple  of millimeters and matched some of the completely unstable and devitalized  chondral segments that fell out at the arthrotomy and irrigation.  This was  keyed in and an attempted to be held with K-wires; we could not achieve  significant compression nor maintain it with the sharp tenaculum, given the  soft condition of the bone at this time.  Consequently, the near cortex was  overdrilled with a 0.35 drill and then a cancellus screw and washer used to  lag it into place; this resulted in a rather secure repair at that place.  The metaphyseal segment was keyed in anatomically using a dental pick and  then the anterolateral Synthes plate lagged down in buttress fashion  directly over this fragment with a standard 3.5 cortical screw through the  oblique long hole.  We confirmed appropriate placement on AP and lateral  images and then placed another series of lag screws through this  anterolateral fragment into the intact posterior cortex of the tibia using  cancellus screws.  These were sequentially tightened to achieve maximal  compression and buttress effect.  As one of these ended up being too long  after full compression, it was exchanged for a locked screw; 1 other locked  screw was placed.  Additional screws were then placed into the shaft segment  of the tibia.   The fibula was also plated through this wound, given that there was some  translation of the distal fragment by nearly 100%.  We were able to key in the reduction, which held rather nicely while we placed a 6-hole plate.  We  were very careful to watch for  the peroneal nerve during placement of this  including the deep branch.  A separate anteromedial incision was then made  using the hockey-stick shape to visualize the anteromedial corner, which was  reduced anatomically under direct visualization and held compressed with a  pointed tenaculum through a drill hole placed into the distal metaphysis.  Two partially threaded 35-mm screws were then inserted, resulting in solid  compression of these fragments.   Similarly from the CT scan, we knew that the syndesmosis was disrupted and a  sharp/sharp tenaculum was placed from the fibula to the tibia to reduce and  provisionally  hold the syndesmosis while 2 tricortical screws were placed  from lateral to medial through percutaneous stab holes.  Final AP and  lateral images were obtained showing appropriate reduction and hardware  placement, as well as mortise view.  The patient's wounds were then  irrigated and closed in standard layered fashion with Vicryl and nylon, as  well as staples for the main portion of the wound.  He was then awakened  from anesthesia and transported to the PACU in stable condition.   PROGNOSIS:  [name] has sustained a very severe injury to the articular  surface of his tibial plafond and also has a matching chondral lesion on his  talus.  We were able to restore appropriate articular width and stability.  Should this fracture go on to heal in the current position, he remains at  very high risk of degenerative arthritis and reduced range of motion.  He  will be on thrombo-prophylaxis with Lovenox and then transitioned to baby  aspirin 10 days after discharge.      Doralee Albino. Carola Frost, M.D.  Electronically Signed     MHH/MEDQ  D:  02/05/2005  T:  02/06/2005  Job:  [id number]

## 2010-08-21 NOTE — Op Note (Signed)
[facility]  Patient:    [name], [name]                       MRN: [id number] Proc. Date: 09/29/00 Adm. Date:  [id number] Attending:  Dominica Severin                           Operative Report  DATE OF BIRTH:  [birth year]  PREOPERATIVE DIAGNOSIS:  Comminuted complex intra-articular distal radial fracture, right upper extremity, with gross displacement.  POSTOPERATIVE DIAGNOSIS:  Comminuted complex intra-articular distal radial fracture, right upper extremity, with gross displacement.  PROCEDURE: 1. Open reduction and internal fixation with calcium phosphate bone grafting    from DePuy and K-wire fixation, right comminuted complex intra-articular    distal radius fracture. 2. Posterior interosseous nerve neurectomy. 3. External fixation application to the right upper extremity secondary to    comminuted complex intra-articular distal radius fracture, right upper    extremity.  SURGEON:  Elisha Ponder, M.D.  ASSISTANT:  Irena Cords, P.A.-C  ANESTHESIA:  General.  ESTIMATED BLOOD LOSS:  Minimal.  COMPLICATIONS:  None.  DRAINS:  One.  INDICATIONS FOR THE PROCEDURE:  Patient is a 65 year old white male previously seen approximately four days ago by myself in the emergency room.  He underwent provisional reduction under hematoma block.  CT scan and plain film were assessed and the above-mentioned findings were noted.  I counseled him in regards to risks and benefits of surgery including risks of infection, bleeding, anesthesia, damage to normal structures and failure of surgery to accomplish its intended goals of relieving symptoms and restoring function. This patient has a very comminuted complex intra-articular right distal radius fracture.  He has high propensity towards malunion if left alone and has a high propensity towards early traumatic arthritis, given the comminuted complex nature.  I have discussed these issues with him  at length.  At the present time, he is neurovascularly intact, understands the risks and benefits and desires to proceed.  I discussed with him the risks of severe stiffness, pain and need for further surgery including total wrist fusion, etc.  OPERATIVE FINDINGS:  This patient had a comminuted complex intra-articular distal radius fracture.  I was pleased with radiographic parameters obtained and stabilization obtained at the time of the surgery.  This was documented under fluoroscopy and permanent pictures were taken for documentation.  OPERATION IN DETAIL:  Patient was seen by myself and anesthesia.  He was taken to the operative suite, underwent a smooth induction of anesthesia and given prophylactic antibiotics.  Following this, the right upper extremity was prepped and draped in the usual sterile fashion under my direction; tourniquet was also applied.  Once this was performed, the patient had the arm isolated and placed in fingertrap traction.  Following this, the patient had placement of provisional K-wire fixation under manipulative reduction.  This was done under fluoroscopy, entering at the radial styloid and engaging the proximal ulnar cortex of the radius; this achieved good purchase.  Following this, patient had application of an external fixator; this was done by making an incision at the distal middle third.  Dissection was carried down between the ECRB and ECRL.  Two half-pins were inserted according to standard AO technique.  Following this, the wound was copiously irrigated and closed.  Two distal half-pins were placed about the second metacarpal utilizing stab incisions and avoiding the extensor tendon apparatus.  Sensory  nerves were avoided at all times during these pin insertions.  The patient had the pins inserted without difficulty; good bicortical purchase was achieved.  Following this, the patient had the wounds irrigated and closed distally.  The  external fixator was then assembled.  Reduction was accomplished with manipulation.  I took care to place the wrist in extension to maximize the pull of the flexor tendons in postop recovery, etc.  Care was taken to not over-distract the radiocarpal joint.  Once this was done, the patient underwent a dorsal approach to the wrist. Dissection was carried down through skin with knife blade.  Hemostasis was gained with bipolar electrocautery.  Once this was done, the interval between the third and fourth dorsal compartments was created.  The EPL tendon was transposed from Listers tubercle.  A large amount of hematoma and blood were in this area; however, the tendon was intact and transposed through the subcutaneous tissue without difficulty.  Once this was done, the patient underwent posterior interosseous nerve neurectomy and this was identified, isolated and a 3-cm segment removed.  Crushing-and-cauterization technique was used.  Once this was done, the comminuted fracture was reassembled.  Following reassembly, the patient had placement of a DePuy calcium phosphate bone graft; this was mixed by myself.  The placement of the bone graft went without difficulty and was checked under fluoroscopy.  I was able to achieve improved radiographic correction.  I also used Vicryl suture for the very tiny comminuted fragments above, creating a capsular closure with the Vicryl.  The Vicryl suture was based distally and volarly to engage bone.  This allowed for secure fixation of the dorsal rim.  I was very happy with the radial inclination, volar tilt and radial height obtained at the time of surgery. X-rays were taken for permanent documentation.  Once this was done, the patient underwent placement of additional bone grafting of the DePuy calcium phosphate variety.  This was inset without difficulty.  I was able to fill the bone voids nicely, as noted under x-ray.  Following this, the  extensor retinaculum, which had been previously Z-lengthened, was placed over top of the bone graft and fracture fragments dorsally.  Following this, the patient had the tourniquet deflated and hemostasis obtained with bipolar  electrocautery.  A #7 TLS drain was placed.  Following this, the wound was closed in layers of 3-0 Vicryl followed by Prolene at the skin edge.  Drain was hooked up to suction.  The EPL was left transposed.  The fourth dorsal compartment was free of any impinging structures, as were the ECRB and ECRL. Patient tolerated this well without difficulty.  He had excellent capillary refill at the conclusion of the case.  Once this was done, Xeroform was placed over all wounds.  I took care to make sure that the external fixator did not impinge against the skin edge significantly.  The Kirschner wires, which were adjusted appropriately during the open procedure, were bent and cut below the skin for later removal.  Following this, patient underwent application of a sterile compressive dressing, lightweight in nature, and a posterior plaster splint without difficulty.  He tolerated this well without difficulty.  All sponge, needle and instrument counts were reported as correct.  Patient will be monitored overnight with IV antibiotics, IV fluid hydration and pain management according to his needs.  I have discussed all findings with the family.  This patient will be monitored very close in the postop period and will need to undergo physical  therapy/occupational therapy with a hand therapist and progressed accordingly.  It was a pleasure to participate in his care and I look forward to participating in his postoperative care. DD:  09/29/00 TD:  09/29/00 Job: [id number]

## 2010-09-15 ENCOUNTER — Encounter: Payer: Self-pay | Admitting: Family Medicine

## 2010-09-16 ENCOUNTER — Ambulatory Visit (INDEPENDENT_AMBULATORY_CARE_PROVIDER_SITE_OTHER): Payer: Medicare Other | Admitting: Family Medicine

## 2010-09-16 ENCOUNTER — Encounter: Payer: Self-pay | Admitting: Family Medicine

## 2010-09-16 VITALS — BP 108/70 | HR 60 | Temp 98.7°F | Wt 178.0 lb

## 2010-09-16 DIAGNOSIS — R399 Unspecified symptoms and signs involving the genitourinary system: Secondary | ICD-10-CM | POA: Insufficient documentation

## 2010-09-16 DIAGNOSIS — R300 Dysuria: Secondary | ICD-10-CM

## 2010-09-16 DIAGNOSIS — R3989 Other symptoms and signs involving the genitourinary system: Secondary | ICD-10-CM

## 2010-09-16 LAB — POCT URINALYSIS DIPSTICK
Blood, UA: NEGATIVE
Glucose, UA: NEGATIVE
Ketones, UA: NEGATIVE
Nitrite, UA: NEGATIVE
Protein, UA: NEGATIVE
Spec Grav, UA: 1.02
Urobilinogen, UA: 0.2
pH, UA: 6

## 2010-09-16 LAB — PSA, MEDICARE: PSA: 3.03 ng/mL (ref <=4.00)

## 2010-09-16 MED ORDER — TAMSULOSIN HCL 0.4 MG PO CAPS: 0.4000 mg | ORAL_CAPSULE | Freq: Every day | ORAL | Status: DC

## 2010-09-16 NOTE — Progress Notes (Signed)
Occ mild burning with urination.  Occ having to strain.  Some variation in stream. Delay in starting, incomplete voiding.  Some days are better than others.  U/a today is unremarkable.  He cut back to 1 cup of coffee a day, w/o changes.  Usually gets up once to urinate at night.    Meds, vitals, and allergies reviewed.   ROS: See HPI.  Otherwise, noncontributory.  nad ncat Mmm rrr ctab abd benign. Prostate gland firm and smooth, no enlargement, nodularity, tenderness, mass, asymmetry or induration.

## 2010-09-16 NOTE — Patient Instructions (Signed)
Start the flomax and we'll contact you with your lab report.  Let us know if you aren't improved.  Take care.

## 2010-09-16 NOTE — Assessment & Plan Note (Addendum)
Likely BPH with a possible component of bladder spasm.  Check ucx , start flomax and check PSA.  See notes on labs and fu prn.  No sign of prostatitis on exam.

## 2010-09-18 LAB — URINE CULTURE: Organism ID, Bacteria: NO GROWTH

## 2011-01-06 ENCOUNTER — Other Ambulatory Visit: Payer: Self-pay | Admitting: Dermatology

## 2011-01-10 ENCOUNTER — Other Ambulatory Visit: Payer: Self-pay | Admitting: Family Medicine

## 2011-02-23 ENCOUNTER — Other Ambulatory Visit: Payer: Self-pay | Admitting: Family Medicine

## 2011-04-02 ENCOUNTER — Encounter: Payer: Self-pay | Admitting: Family Medicine

## 2011-04-02 ENCOUNTER — Ambulatory Visit (INDEPENDENT_AMBULATORY_CARE_PROVIDER_SITE_OTHER): Payer: Medicare Other | Admitting: Family Medicine

## 2011-04-02 DIAGNOSIS — K621 Rectal polyp: Secondary | ICD-10-CM

## 2011-04-02 DIAGNOSIS — R3989 Other symptoms and signs involving the genitourinary system: Secondary | ICD-10-CM

## 2011-04-02 DIAGNOSIS — R399 Unspecified symptoms and signs involving the genitourinary system: Secondary | ICD-10-CM

## 2011-04-02 DIAGNOSIS — K4090 Unilateral inguinal hernia, without obstruction or gangrene, not specified as recurrent: Secondary | ICD-10-CM

## 2011-04-02 DIAGNOSIS — Z8042 Family history of malignant neoplasm of prostate: Secondary | ICD-10-CM

## 2011-04-02 DIAGNOSIS — I10 Essential (primary) hypertension: Secondary | ICD-10-CM

## 2011-04-02 DIAGNOSIS — K219 Gastro-esophageal reflux disease without esophagitis: Secondary | ICD-10-CM

## 2011-04-02 DIAGNOSIS — K620 Anal polyp: Secondary | ICD-10-CM

## 2011-04-02 LAB — COMPREHENSIVE METABOLIC PANEL
AST: 20 U/L (ref 0–37)
Albumin: 4.5 g/dL (ref 3.5–5.2)
BUN: 17 mg/dL (ref 6–23)
CO2: 29 mEq/L (ref 19–32)
Calcium: 9.6 mg/dL (ref 8.4–10.5)
Chloride: 109 mEq/L (ref 96–112)
Creatinine, Ser: 1 mg/dL (ref 0.4–1.5)
GFR: 81.87 mL/min (ref >60.00)
Potassium: 5.2 mEq/L — ABNORMAL HIGH (ref 3.5–5.1)

## 2011-04-02 LAB — LIPID PANEL
Cholesterol: 178 mg/dL (ref 0–200)
Triglycerides: 45 mg/dL (ref 0.0–149.0)

## 2011-04-02 MED ORDER — LISINOPRIL 40 MG PO TABS: 40.0000 mg | ORAL_TABLET | Freq: Every day | ORAL | Status: DC

## 2011-04-02 MED ORDER — TAMSULOSIN HCL 0.4 MG PO CAPS: 0.4000 mg | ORAL_CAPSULE | Freq: Every day | ORAL | Status: DC

## 2011-04-02 NOTE — Patient Instructions (Signed)
Take care.  You can get your results through our phone system.  Follow the instructions on the blue card. Call with concerns.  Recheck labs in 1 year at a physical.  Glad to see you.   Check with your insurance to see if they will cover the shingles shot.

## 2011-04-02 NOTE — Progress Notes (Signed)
BPH sx.  Slow stream of urine, but improved emptying overall on flomax.  Nocturia is occ, depends on amount of intake and timing.  No burning with urination.    Hypertension:    Using medication without problems or lightheadedness: yes Chest pain with exertion:no Edema:no Short of breath:no  Heartburn, occ PPI use, 1-2 doses in the last month.  Doing well.    L inguinal hernia, not enlarging, minimal sx per patient.    zostavax rx given to pt.  Handwritten.    Meds, vitals, and allergies reviewed.   PMH and SH reviewed  ROS: See HPI.  Otherwise negative.    GEN: nad, alert and oriented HEENT: mucous membranes moist NECK: supple w/o LA CV: rrr. PULM: ctab, no inc wob ABD: soft, +bs EXT: no edema SKIN: no acute rash LIH noted, easily reduced and soft.  Prostate gland firm and smooth, mild enlargement, but no nodularity, tenderness, mass, asymmetry or induration.

## 2011-04-07 DIAGNOSIS — K4090 Unilateral inguinal hernia, without obstruction or gangrene, not specified as recurrent: Secondary | ICD-10-CM | POA: Insufficient documentation

## 2011-04-07 NOTE — Assessment & Plan Note (Signed)
Check IFOB.  D/w pt and he elects for IFOB.

## 2011-04-07 NOTE — Assessment & Plan Note (Signed)
No sig change, minimal sx, incarceration caution given, he elects not to have eval/repair now.  F/u prn, esp if sx or inc in size.

## 2011-04-07 NOTE — Assessment & Plan Note (Signed)
PSA stable and no sig change on exam.

## 2011-04-07 NOTE — Assessment & Plan Note (Signed)
Cont prn PPI, f/u prn.

## 2011-04-07 NOTE — Assessment & Plan Note (Signed)
Controlled, no change in meds. Labs d/w pt.  

## 2011-04-08 ENCOUNTER — Other Ambulatory Visit: Payer: Self-pay | Admitting: Family Medicine

## 2011-04-08 ENCOUNTER — Other Ambulatory Visit: Payer: Medicare Other

## 2011-04-08 DIAGNOSIS — Z1289 Encounter for screening for malignant neoplasm of other sites: Secondary | ICD-10-CM

## 2011-04-08 LAB — FECAL OCCULT BLOOD, IMMUNOCHEMICAL: Fecal Occult Bld: NEGATIVE

## 2011-04-09 ENCOUNTER — Encounter: Payer: Self-pay | Admitting: *Deleted

## 2011-07-14 ENCOUNTER — Other Ambulatory Visit: Payer: Self-pay | Admitting: Dermatology

## 2011-11-03 ENCOUNTER — Encounter: Payer: Self-pay | Admitting: Gastroenterology

## 2011-12-13 ENCOUNTER — Encounter: Payer: Self-pay | Admitting: Family Medicine

## 2011-12-13 DIAGNOSIS — G500 Trigeminal neuralgia: Secondary | ICD-10-CM | POA: Insufficient documentation

## 2012-01-31 ENCOUNTER — Other Ambulatory Visit: Payer: Self-pay | Admitting: Family Medicine

## 2012-02-15 ENCOUNTER — Other Ambulatory Visit: Payer: Self-pay | Admitting: Family Medicine

## 2012-03-30 ENCOUNTER — Other Ambulatory Visit: Payer: Self-pay | Admitting: Family Medicine

## 2012-03-30 NOTE — Telephone Encounter (Signed)
Patient was advised through pharmacy to schedule OV prior to further refills back in November.  Please advise.

## 2012-04-02 NOTE — Telephone Encounter (Signed)
Call pt. Schedule the CPE.  Can fill the rx until that point.  If not scheduled for CPE, then deny the rx.  Thanks.

## 2012-04-03 ENCOUNTER — Encounter: Payer: Self-pay | Admitting: Gastroenterology

## 2012-04-05 ENCOUNTER — Other Ambulatory Visit: Payer: Self-pay | Admitting: Family Medicine

## 2012-04-05 DIAGNOSIS — Z125 Encounter for screening for malignant neoplasm of prostate: Secondary | ICD-10-CM

## 2012-04-05 DIAGNOSIS — C4491 Basal cell carcinoma of skin, unspecified: Secondary | ICD-10-CM

## 2012-04-05 DIAGNOSIS — I10 Essential (primary) hypertension: Secondary | ICD-10-CM

## 2012-04-05 DIAGNOSIS — C4492 Squamous cell carcinoma of skin, unspecified: Secondary | ICD-10-CM

## 2012-04-05 HISTORY — DX: Basal cell carcinoma of skin, unspecified: C44.91

## 2012-04-05 HISTORY — DX: Squamous cell carcinoma of skin, unspecified: C44.92

## 2012-04-07 ENCOUNTER — Other Ambulatory Visit (INDEPENDENT_AMBULATORY_CARE_PROVIDER_SITE_OTHER): Payer: Medicare Other

## 2012-04-07 DIAGNOSIS — I10 Essential (primary) hypertension: Secondary | ICD-10-CM

## 2012-04-07 DIAGNOSIS — Z125 Encounter for screening for malignant neoplasm of prostate: Secondary | ICD-10-CM

## 2012-04-07 LAB — LIPID PANEL
Cholesterol: 164 mg/dL (ref 0–200)
LDL Cholesterol: 102 mg/dL — ABNORMAL HIGH (ref 0–99)
VLDL: 7.6 mg/dL (ref 0.0–40.0)

## 2012-04-07 LAB — COMPREHENSIVE METABOLIC PANEL
AST: 19 U/L (ref 0–37)
Albumin: 3.7 g/dL (ref 3.5–5.2)
Alkaline Phosphatase: 54 U/L (ref 39–117)
BUN: 17 mg/dL (ref 6–23)
Potassium: 4.4 mEq/L (ref 3.5–5.1)
Total Bilirubin: 0.8 mg/dL (ref 0.3–1.2)

## 2012-04-14 ENCOUNTER — Encounter: Payer: Self-pay | Admitting: Family Medicine

## 2012-04-14 ENCOUNTER — Ambulatory Visit (INDEPENDENT_AMBULATORY_CARE_PROVIDER_SITE_OTHER): Payer: Medicare Other | Admitting: Family Medicine

## 2012-04-14 VITALS — BP 130/74 | HR 74 | Temp 98.3°F | Ht 71.0 in | Wt 197.0 lb

## 2012-04-14 DIAGNOSIS — I10 Essential (primary) hypertension: Secondary | ICD-10-CM

## 2012-04-14 DIAGNOSIS — R399 Unspecified symptoms and signs involving the genitourinary system: Secondary | ICD-10-CM

## 2012-04-14 DIAGNOSIS — R3989 Other symptoms and signs involving the genitourinary system: Secondary | ICD-10-CM

## 2012-04-14 DIAGNOSIS — Z0000 Encounter for general adult medical examination without abnormal findings: Secondary | ICD-10-CM

## 2012-04-14 DIAGNOSIS — Z23 Encounter for immunization: Secondary | ICD-10-CM

## 2012-04-14 DIAGNOSIS — G500 Trigeminal neuralgia: Secondary | ICD-10-CM

## 2012-04-14 MED ORDER — TAMSULOSIN HCL 0.4 MG PO CAPS: 0.4000 mg | ORAL_CAPSULE | Freq: Every day | ORAL | Status: DC

## 2012-04-14 MED ORDER — FINASTERIDE 5 MG PO TABS: 5.0000 mg | ORAL_TABLET | Freq: Every day | ORAL | Status: DC

## 2012-04-14 MED ORDER — LISINOPRIL 40 MG PO TABS: 40.0000 mg | ORAL_TABLET | Freq: Every day | ORAL | Status: DC

## 2012-04-14 NOTE — Patient Instructions (Addendum)
Check with your insurance to see if they will cover the shingles shot. Add on the finasteride and see if that helps with urination.  Take care.

## 2012-04-14 NOTE — Progress Notes (Signed)
I have personally reviewed the Medicare Annual Wellness questionnaire and have noted 1. The patient's medical and social history 2. Their use of alcohol, tobacco or illicit drugs 3. Their current medications and supplements 4. The patient's functional ability including ADL's, fall risks, home safety risks and hearing or visual             impairment. 5. Diet and physical activities 6. Evidence for depression or mood disorders  The patients weight, height, BMI have been recorded in the chart and visual acuity is per eye clinic.  I have made referrals, counseling and provided education to the patient based review of the above and I have provided the pt with a written personalized care plan for preventive services.  See scanned forms.  Routine anticipatory guidance given to patient.  See health maintenance. Flu done 11/13 Shingles d/w pt.  PNA due at age 67 Tetanus 2014 Colonoscopy f/u pending Prostate cancer screening d/w pt.  PSA wnl.  Advance directive d/w pt.  Would want his daughter designated if incapacitated.   Cognitive function addressed- see scanned forms- and if abnormal then additional documentation follows.   Hypertension:    Using medication without problems or lightheadedness: yes Chest pain with exertion:no Edema:no Short of breath:no  BPH sx. Slow stream of urine prev, but improved emptying overall on flomax. Nocturia is resolved. No burning with urination. He has some overflow sx if prolonged period between urination.   PMH and SH reviewed  Meds, vitals, and allergies reviewed.   ROS: See HPI.  Otherwise negative.    GEN: nad, alert and oriented HEENT: mucous membranes moist NECK: supple w/o LA CV: rrr. PULM: ctab, no inc wob ABD: soft, +bs EXT: no edema SKIN: no acute rash Prostate gland firm and smooth, symmetric enlargement but no nodularity, tenderness, mass, asymmetry or induration

## 2012-04-16 DIAGNOSIS — Z0000 Encounter for general adult medical examination without abnormal findings: Secondary | ICD-10-CM | POA: Insufficient documentation

## 2012-04-16 NOTE — Assessment & Plan Note (Signed)
With BPH and PSA wnl.  Add on finasteride and he'll report back as needed.  He agrees. Continue flomax.

## 2012-04-16 NOTE — Assessment & Plan Note (Signed)
See scanned forms.  Routine anticipatory guidance given to patient.  See health maintenance. Flu done 11/13 Shingles d/w pt.  PNA due at age 67 Tetanus 2014 Colonoscopy f/u pending Prostate cancer screening d/w pt.  PSA wnl.  Advance directive d/w pt.  Would want his daughter designated if incapacitated.   Cognitive function addressed- see scanned forms- and if abnormal then additional documentation follows.

## 2012-04-16 NOTE — Assessment & Plan Note (Signed)
Improved on lyrica.  Continue per neuro.

## 2012-04-16 NOTE — Assessment & Plan Note (Signed)
Controlled, continue current meds and healthy diet.

## 2012-05-02 ENCOUNTER — Ambulatory Visit (AMBULATORY_SURGERY_CENTER): Payer: Medicare Other

## 2012-05-02 VITALS — Ht 72.0 in | Wt 194.8 lb

## 2012-05-02 DIAGNOSIS — Z800 Family history of malignant neoplasm of digestive organs: Secondary | ICD-10-CM

## 2012-05-02 DIAGNOSIS — Z86010 Personal history of colonic polyps: Secondary | ICD-10-CM

## 2012-05-02 DIAGNOSIS — Z1211 Encounter for screening for malignant neoplasm of colon: Secondary | ICD-10-CM

## 2012-05-02 MED ORDER — MOVIPREP 100 G PO SOLR: ORAL | Status: DC

## 2012-05-16 ENCOUNTER — Ambulatory Visit (AMBULATORY_SURGERY_CENTER): Payer: Medicare Other | Admitting: Gastroenterology

## 2012-05-16 ENCOUNTER — Encounter: Payer: Self-pay | Admitting: Gastroenterology

## 2012-05-16 VITALS — BP 123/86 | HR 56 | Temp 96.7°F | Resp 16 | Ht 72.0 in | Wt 194.0 lb

## 2012-05-16 DIAGNOSIS — Z1211 Encounter for screening for malignant neoplasm of colon: Secondary | ICD-10-CM

## 2012-05-16 DIAGNOSIS — Z86010 Personal history of colonic polyps: Secondary | ICD-10-CM

## 2012-05-16 DIAGNOSIS — Z800 Family history of malignant neoplasm of digestive organs: Secondary | ICD-10-CM

## 2012-05-16 MED ORDER — SODIUM CHLORIDE 0.9 % IV SOLN: 500.0000 mL | INTRAVENOUS | Status: DC

## 2012-05-16 NOTE — Op Note (Signed)
[facility] [address] [place], [address]   COLONOSCOPY PROCEDURE REPORT  PATIENT: [name], [name]  MR#: [id number] BIRTHDATE: 02/02/1946 , 67  yrs. old GENDER: Male ENDOSCOPIST: Meryl Dare, MD, [facility] PROCEDURE DATE:  05/16/2012 PROCEDURE:   Colonoscopy, screening ASA CLASS:   Class II INDICATIONS:Patient's personal history of adenomatous colon polyps and patient's immediate family history of colon cancer. MEDICATIONS: MAC sedation, administered by CRNA and propofol (Diprivan) 200mg  IV DESCRIPTION OF PROCEDURE:   After the risks benefits and alternatives of the procedure were thoroughly explained, informed consent was obtained.  A digital rectal exam revealed no abnormalities of the rectum.   The LB [id number]  endoscope was introduced through the anus and advanced to the cecum, which was identified by both the appendix and ileocecal valve. No adverse events experienced.   The quality of the prep was excellent, using MoviPrep  The instrument was then slowly withdrawn as the colon was fully examined.  COLON FINDINGS: Mild diverticulosis was noted in the sigmoid colon. The colon was otherwise normal.  There was no diverticulosis, inflammation, polyps or cancers unless previously stated. Retroflexed views revealed small internal hemorrhoids. The time to cecum=2 minutes 54 seconds.  Withdrawal time=11 minutes 07 seconds. The scope was withdrawn and the procedure completed. COMPLICATIONS: There were no complications.  ENDOSCOPIC IMPRESSION: 1.   Mild diverticulosis was noted in the sigmoid colon 2.   Internal hemorrhoids  RECOMMENDATIONS: 1.  High fiber diet with liberal fluid intake. 2.  Repeat Colonoscopy in 5 years.   eSigned:  Meryl Dare, MD, [facility] 05/16/2012 9:20 AM

## 2012-05-16 NOTE — Progress Notes (Signed)
Pt advised to pass gas when he arrived in recovery, pt did pass a little bit of gas and when prompted to pass more pt states he has passed all that is in there, pt is laying in supine position and when asked to roll to left side refuses to stay there, pt is denying cramps or any pain    Patient did not experience any of the following events: a burn prior to discharge; a fall within the facility; wrong site/side/patient/procedure/implant event; or a hospital transfer or hospital admission upon discharge from the facility. ([id number])Patient did not have preoperative order for IV antibiotic SSI prophylaxis. [phone])

## 2012-05-16 NOTE — Progress Notes (Signed)
No egg or soy allergy. ewm 

## 2012-05-16 NOTE — Patient Instructions (Addendum)

## 2012-05-17 ENCOUNTER — Telehealth: Payer: Self-pay | Admitting: *Deleted

## 2012-05-17 NOTE — Telephone Encounter (Signed)
Number identifier, left message, follow-up  

## 2012-05-29 ENCOUNTER — Other Ambulatory Visit: Payer: Self-pay | Admitting: Family Medicine

## 2012-06-29 ENCOUNTER — Other Ambulatory Visit: Payer: Self-pay | Admitting: Family Medicine

## 2012-07-12 ENCOUNTER — Other Ambulatory Visit: Payer: Self-pay | Admitting: Dermatology

## 2012-07-14 ENCOUNTER — Encounter: Payer: Self-pay | Admitting: Family Medicine

## 2012-07-31 ENCOUNTER — Other Ambulatory Visit: Payer: Self-pay | Admitting: Dermatology

## 2012-12-05 ENCOUNTER — Encounter: Payer: Self-pay | Admitting: Family Medicine

## 2012-12-05 ENCOUNTER — Ambulatory Visit (INDEPENDENT_AMBULATORY_CARE_PROVIDER_SITE_OTHER): Payer: Medicare Other | Admitting: Family Medicine

## 2012-12-05 VITALS — BP 100/72 | HR 52 | Temp 97.6°F | Ht 72.0 in | Wt 177.5 lb

## 2012-12-05 DIAGNOSIS — Z23 Encounter for immunization: Secondary | ICD-10-CM

## 2012-12-05 DIAGNOSIS — G500 Trigeminal neuralgia: Secondary | ICD-10-CM

## 2012-12-05 MED ORDER — PREGABALIN 150 MG PO CAPS: 150.0000 mg | ORAL_CAPSULE | Freq: Three times a day (TID) | ORAL | Status: DC

## 2012-12-05 MED ORDER — PREGABALIN 50 MG PO CAPS: ORAL_CAPSULE | ORAL | Status: DC

## 2012-12-05 NOTE — Progress Notes (Signed)
L V3 distribution pain. Similar to prev trigeminal neuralgia. He improved on lyrica, now off med and the sx returned. Shoot pain pain. Pain with chewing, talking.  No L arm pain, no CP, SOB. No R sided sx. No rash.  H/o L sided pain starting 15 years ago.  Prev neuro notes reviewed.  No help with carbamazepine and gabapentin prev.    Meds, vitals, and allergies reviewed.   ROS: See HPI.  Otherwise, noncontributory.  GEN: nad, alert and oriented HEENT: mucous membranes moist NECK: supple w/o LA CV: rrr SKIN: no acute rash CN 2-12 wnl B, S/S wnl x4

## 2012-12-05 NOTE — Assessment & Plan Note (Signed)
Didn't respond to carbamazepine or gabapentin Did well with lyrica prev. Will titrate back up to 150mg  tid.  Start with 50mg  a day.  He agrees.  Offered [facility] referral.  He'll consider.

## 2012-12-05 NOTE — Addendum Note (Signed)
Addended by: Patience Musca on: 12/05/2012 10:16 AM   Modules accepted: Orders

## 2012-12-05 NOTE — Patient Instructions (Addendum)
Start back on 50mg  a day. Gradually work up to 150mg  3 times a day.  Then change to the 150mg  tabs.   Take care. Glad to see you.

## 2013-04-10 ENCOUNTER — Other Ambulatory Visit: Payer: Self-pay | Admitting: Family Medicine

## 2013-04-10 DIAGNOSIS — Z125 Encounter for screening for malignant neoplasm of prostate: Secondary | ICD-10-CM

## 2013-04-10 DIAGNOSIS — I10 Essential (primary) hypertension: Secondary | ICD-10-CM

## 2013-04-16 ENCOUNTER — Other Ambulatory Visit (INDEPENDENT_AMBULATORY_CARE_PROVIDER_SITE_OTHER): Payer: Medicare Other

## 2013-04-16 DIAGNOSIS — Z125 Encounter for screening for malignant neoplasm of prostate: Secondary | ICD-10-CM

## 2013-04-16 DIAGNOSIS — I10 Essential (primary) hypertension: Secondary | ICD-10-CM

## 2013-04-16 LAB — LIPID PANEL
CHOL/HDL RATIO: 4
CHOLESTEROL: 194 mg/dL (ref 0–200)
HDL: 52.6 mg/dL (ref >39.00)
LDL CALC: 128 mg/dL — AB (ref 0–99)
TRIGLYCERIDES: 69 mg/dL (ref 0.0–149.0)
VLDL: 13.8 mg/dL (ref 0.0–40.0)

## 2013-04-16 LAB — COMPREHENSIVE METABOLIC PANEL
ALBUMIN: 4 g/dL (ref 3.5–5.2)
ALK PHOS: 67 U/L (ref 39–117)
ALT: 18 U/L (ref 0–53)
AST: 15 U/L (ref 0–37)
BILIRUBIN TOTAL: 0.6 mg/dL (ref 0.3–1.2)
BUN: 14 mg/dL (ref 6–23)
CO2: 30 mEq/L (ref 19–32)
CREATININE: 1.2 mg/dL (ref 0.4–1.5)
Calcium: 9.5 mg/dL (ref 8.4–10.5)
Chloride: 107 mEq/L (ref 96–112)
GFR: 66.3 mL/min (ref >60.00)
GLUCOSE: 106 mg/dL — AB (ref 70–99)
POTASSIUM: 4.5 meq/L (ref 3.5–5.1)
Sodium: 142 mEq/L (ref 135–145)
Total Protein: 6.7 g/dL (ref 6.0–8.3)

## 2013-04-16 LAB — PSA, MEDICARE: PSA: 1.78 ng/ml (ref 0.10–4.00)

## 2013-04-23 ENCOUNTER — Encounter: Payer: Self-pay | Admitting: Family Medicine

## 2013-04-23 ENCOUNTER — Ambulatory Visit (INDEPENDENT_AMBULATORY_CARE_PROVIDER_SITE_OTHER): Payer: Medicare Other | Admitting: Family Medicine

## 2013-04-23 VITALS — BP 102/80 | HR 80 | Temp 97.6°F | Ht 72.0 in | Wt 197.5 lb

## 2013-04-23 DIAGNOSIS — G500 Trigeminal neuralgia: Secondary | ICD-10-CM

## 2013-04-23 DIAGNOSIS — Z23 Encounter for immunization: Secondary | ICD-10-CM

## 2013-04-23 DIAGNOSIS — Z0000 Encounter for general adult medical examination without abnormal findings: Secondary | ICD-10-CM

## 2013-04-23 DIAGNOSIS — K1379 Other lesions of oral mucosa: Secondary | ICD-10-CM

## 2013-04-23 DIAGNOSIS — R3989 Other symptoms and signs involving the genitourinary system: Secondary | ICD-10-CM

## 2013-04-23 DIAGNOSIS — K1370 Unspecified lesions of oral mucosa: Secondary | ICD-10-CM

## 2013-04-23 DIAGNOSIS — R399 Unspecified symptoms and signs involving the genitourinary system: Secondary | ICD-10-CM

## 2013-04-23 DIAGNOSIS — N529 Male erectile dysfunction, unspecified: Secondary | ICD-10-CM

## 2013-04-23 DIAGNOSIS — I10 Essential (primary) hypertension: Secondary | ICD-10-CM

## 2013-04-23 MED ORDER — CLOBETASOL PROPIONATE 0.05 % EX OINT: 1.0000 "application " | TOPICAL_OINTMENT | Freq: Two times a day (BID) | CUTANEOUS | Status: DC | PRN

## 2013-04-23 MED ORDER — LISINOPRIL 40 MG PO TABS: 40.0000 mg | ORAL_TABLET | Freq: Every day | ORAL | Status: DC

## 2013-04-23 MED ORDER — TADALAFIL 20 MG PO TABS: 10.0000 mg | ORAL_TABLET | ORAL | Status: DC | PRN

## 2013-04-23 MED ORDER — FINASTERIDE 5 MG PO TABS
5.0000 mg | ORAL_TABLET | Freq: Every day | ORAL | Status: DC
Start: 2013-04-23 — End: 2014-03-26

## 2013-04-23 MED ORDER — TAMSULOSIN HCL 0.4 MG PO CAPS: ORAL_CAPSULE | ORAL | Status: DC

## 2013-04-23 MED ORDER — PREGABALIN 150 MG PO CAPS: 150.0000 mg | ORAL_CAPSULE | Freq: Three times a day (TID) | ORAL | Status: DC

## 2013-04-23 NOTE — Progress Notes (Signed)
Pre-visit discussion using our clinic review tool. No additional management support is needed unless otherwise documented below in the visit note.  CPE- See plan.  Routine anticipatory guidance given to patient.  See health maintenance. Tetanus 2014 PNA shot 2014 Shingles shot d/w pt.  Flu shot prev done.   Colonoscopy 2014 PSA wnl. Discussed.  Living will- would have daughter designated if incapacitated.    Trigeminal neuralgia.  Better with current meds.  No ADE.  Much less pain now.  No other neuro sx.   Hypertension:    Using medication without problems or lightheadedness: yes Chest pain with exertion:no Edema:no Short of breath:no  BPH- still with some LUTS.  Worse with caffeine.  Discussed caffeine taper.  Compliant with meds.    ED- worse recently.  Interested in [place]. No contraindication.  Libido wnl.   occ oral ulcers, responds to brief use of clobetasol. Needs refill.   PMH and SH reviewed  Meds, vitals, and allergies reviewed.   ROS: See HPI.  Otherwise negative.    GEN: nad, alert and oriented HEENT: mucous membranes moist NECK: supple w/o LA CV: rrr. PULM: ctab, no inc wob ABD: soft, +bs EXT: no edema SKIN: no acute rash

## 2013-04-23 NOTE — Patient Instructions (Addendum)
Check with your insurance to see if they will cover the shingles shot. Try the cialis and let me know if that doesn't help.   Take care.  Glad to see you.

## 2013-04-23 NOTE — Assessment & Plan Note (Signed)
Continue as is.  Doing well.

## 2013-04-23 NOTE — Assessment & Plan Note (Signed)
Routine anticipatory guidance given to patient.  See health maintenance. Tetanus 2014 PNA shot 2014 Shingles shot d/w pt.  Flu shot prev done.   Colonoscopy 2014 PSA wnl. Discussed.  Living will- would have daughter designated if incapacitated.

## 2013-04-23 NOTE — Assessment & Plan Note (Signed)
Can use prn clobetasol, f/u if not improved.  No lesions currently.

## 2013-04-23 NOTE — Assessment & Plan Note (Signed)
Limit caffeine, continue as is.  PSA lower as expected on finasteride.  D/w pt.

## 2013-04-23 NOTE — Assessment & Plan Note (Signed)
Controlled, continue current meds. Labs d/w pt.  

## 2013-04-23 NOTE — Assessment & Plan Note (Signed)
cialis trial, routine cautions.  F/u prn.

## 2013-05-09 ENCOUNTER — Telehealth: Payer: Self-pay | Admitting: Family Medicine

## 2013-05-09 NOTE — Telephone Encounter (Signed)
Relevant patient education mailed to patient.  

## 2013-05-10 ENCOUNTER — Ambulatory Visit: Payer: Self-pay | Admitting: Neurology

## 2013-08-01 ENCOUNTER — Other Ambulatory Visit: Payer: Self-pay | Admitting: Dermatology

## 2013-10-12 ENCOUNTER — Emergency Department (HOSPITAL_COMMUNITY): Payer: Medicare Other

## 2013-10-12 ENCOUNTER — Encounter (HOSPITAL_COMMUNITY): Payer: Self-pay | Admitting: Emergency Medicine

## 2013-10-12 ENCOUNTER — Observation Stay (HOSPITAL_COMMUNITY)
Admission: EM | Admit: 2013-10-12 | Discharge: 2013-10-13 | Disposition: A | Discharge status: Home / Self Care | Payer: Medicare Other | Attending: Family Medicine | Admitting: Family Medicine

## 2013-10-12 DIAGNOSIS — N529 Male erectile dysfunction, unspecified: Secondary | ICD-10-CM | POA: Diagnosis not present

## 2013-10-12 DIAGNOSIS — G250 Essential tremor: Secondary | ICD-10-CM | POA: Insufficient documentation

## 2013-10-12 DIAGNOSIS — M79609 Pain in unspecified limb: Secondary | ICD-10-CM

## 2013-10-12 DIAGNOSIS — K5730 Diverticulosis of large intestine without perforation or abscess without bleeding: Secondary | ICD-10-CM | POA: Insufficient documentation

## 2013-10-12 DIAGNOSIS — G252 Other specified forms of tremor: Secondary | ICD-10-CM

## 2013-10-12 DIAGNOSIS — M545 Low back pain, unspecified: Secondary | ICD-10-CM | POA: Insufficient documentation

## 2013-10-12 DIAGNOSIS — Z86718 Personal history of other venous thrombosis and embolism: Secondary | ICD-10-CM | POA: Diagnosis present

## 2013-10-12 DIAGNOSIS — M7989 Other specified soft tissue disorders: Secondary | ICD-10-CM

## 2013-10-12 DIAGNOSIS — I82409 Acute embolism and thrombosis of unspecified deep veins of unspecified lower extremity: Secondary | ICD-10-CM | POA: Diagnosis not present

## 2013-10-12 DIAGNOSIS — E785 Hyperlipidemia, unspecified: Secondary | ICD-10-CM | POA: Insufficient documentation

## 2013-10-12 DIAGNOSIS — Z9181 History of falling: Secondary | ICD-10-CM | POA: Insufficient documentation

## 2013-10-12 DIAGNOSIS — I10 Essential (primary) hypertension: Secondary | ICD-10-CM | POA: Diagnosis not present

## 2013-10-12 DIAGNOSIS — Z860100 Personal history of colon polyps, unspecified: Secondary | ICD-10-CM | POA: Insufficient documentation

## 2013-10-12 DIAGNOSIS — C4491 Basal cell carcinoma of skin, unspecified: Secondary | ICD-10-CM | POA: Insufficient documentation

## 2013-10-12 DIAGNOSIS — G500 Trigeminal neuralgia: Secondary | ICD-10-CM | POA: Insufficient documentation

## 2013-10-12 DIAGNOSIS — Z23 Encounter for immunization: Secondary | ICD-10-CM | POA: Insufficient documentation

## 2013-10-12 DIAGNOSIS — G43909 Migraine, unspecified, not intractable, without status migrainosus: Secondary | ICD-10-CM | POA: Insufficient documentation

## 2013-10-12 DIAGNOSIS — K4090 Unilateral inguinal hernia, without obstruction or gangrene, not specified as recurrent: Secondary | ICD-10-CM | POA: Insufficient documentation

## 2013-10-12 DIAGNOSIS — I82402 Acute embolism and thrombosis of unspecified deep veins of left lower extremity: Secondary | ICD-10-CM

## 2013-10-12 DIAGNOSIS — K219 Gastro-esophageal reflux disease without esophagitis: Secondary | ICD-10-CM | POA: Insufficient documentation

## 2013-10-12 LAB — BASIC METABOLIC PANEL
Anion gap: 12 (ref 5–15)
BUN: 25 mg/dL — AB (ref 6–23)
CALCIUM: 9 mg/dL (ref 8.4–10.5)
CO2: 26 meq/L (ref 19–32)
CREATININE: 1.39 mg/dL — AB (ref 0.50–1.35)
Chloride: 103 mEq/L (ref 96–112)
GFR calc Af Amer: 59 mL/min — ABNORMAL LOW (ref >90)
GFR, EST NON AFRICAN AMERICAN: 51 mL/min — AB (ref >90)
Glucose, Bld: 97 mg/dL (ref 70–99)
Potassium: 4.9 mEq/L (ref 3.7–5.3)
Sodium: 141 mEq/L (ref 137–147)

## 2013-10-12 LAB — CBC
HCT: 38.9 % — ABNORMAL LOW (ref 39.0–52.0)
HEMOGLOBIN: 12.5 g/dL — AB (ref 13.0–17.0)
MCH: 28.1 pg (ref 26.0–34.0)
MCHC: 32.1 g/dL (ref 30.0–36.0)
MCV: 87.4 fL (ref 78.0–100.0)
Platelets: 153 10*3/uL (ref 150–400)
RBC: 4.45 MIL/uL (ref 4.22–5.81)
RDW: 12.8 % (ref 11.5–15.5)
WBC: 8 10*3/uL (ref 4.0–10.5)

## 2013-10-12 LAB — PROTIME-INR
INR: 1.06 (ref 0.00–1.49)
Prothrombin Time: 13.8 seconds (ref 11.6–15.2)

## 2013-10-12 MED ORDER — ACETAMINOPHEN 650 MG RE SUPP: 650.0000 mg | Freq: Four times a day (QID) | RECTAL | Status: DC | PRN

## 2013-10-12 MED ORDER — SODIUM CHLORIDE 0.9 % IV SOLN: 250.0000 mL | INTRAVENOUS | Status: DC | PRN

## 2013-10-12 MED ORDER — ONDANSETRON HCL 4 MG PO TABS: 4.0000 mg | ORAL_TABLET | Freq: Four times a day (QID) | ORAL | Status: DC | PRN

## 2013-10-12 MED ORDER — PREGABALIN 75 MG PO CAPS
150.0000 mg | ORAL_CAPSULE | Freq: Three times a day (TID) | ORAL | Status: DC
  Administered 2013-10-12 – 2013-10-13 (×2): 150 mg via ORAL
  Filled 2013-10-12 (×2): qty 2

## 2013-10-12 MED ORDER — IBUPROFEN 800 MG PO TABS
800.0000 mg | ORAL_TABLET | Freq: Once | ORAL | Status: AC
  Administered 2013-10-12: 800 mg via ORAL
  Filled 2013-10-12: qty 2

## 2013-10-12 MED ORDER — DOXYLAMINE SUCCINATE (SLEEP) 25 MG PO TABS: 25.0000 mg | ORAL_TABLET | Freq: Every evening | ORAL | Status: DC | PRN

## 2013-10-12 MED ORDER — TAMSULOSIN HCL 0.4 MG PO CAPS
0.4000 mg | ORAL_CAPSULE | Freq: Every day | ORAL | Status: DC
  Filled 2013-10-12: qty 1

## 2013-10-12 MED ORDER — ONDANSETRON HCL 4 MG/2ML IJ SOLN: 4.0000 mg | Freq: Four times a day (QID) | INTRAMUSCULAR | Status: DC | PRN

## 2013-10-12 MED ORDER — PNEUMOCOCCAL VAC POLYVALENT 25 MCG/0.5ML IJ INJ
0.5000 mL | INJECTION | INTRAMUSCULAR | Status: AC
  Administered 2013-10-13: 0.5 mL via INTRAMUSCULAR
  Filled 2013-10-12: qty 0.5

## 2013-10-12 MED ORDER — VITAMIN E 180 MG (400 UNIT) PO CAPS
400.0000 [IU] | ORAL_CAPSULE | Freq: Every day | ORAL | Status: DC
  Administered 2013-10-13: 400 [IU] via ORAL
  Filled 2013-10-12: qty 1

## 2013-10-12 MED ORDER — ACETAMINOPHEN 325 MG PO TABS
650.0000 mg | ORAL_TABLET | Freq: Four times a day (QID) | ORAL | Status: DC | PRN
  Administered 2013-10-13: 650 mg via ORAL
  Filled 2013-10-12: qty 2

## 2013-10-12 MED ORDER — SODIUM CHLORIDE 0.9 % IJ SOLN: 3.0000 mL | INTRAMUSCULAR | Status: DC | PRN

## 2013-10-12 MED ORDER — SODIUM CHLORIDE 0.9 % IJ SOLN: 3.0000 mL | Freq: Two times a day (BID) | INTRAMUSCULAR | Status: DC

## 2013-10-12 MED ORDER — HEPARIN (PORCINE) IN NACL 100-0.45 UNIT/ML-% IJ SOLN
1350.0000 [IU]/h | INTRAMUSCULAR | Status: DC
Start: 2013-10-12 — End: 2013-10-13
  Administered 2013-10-12: 1200 [IU]/h via INTRAVENOUS
  Filled 2013-10-12 (×2): qty 250

## 2013-10-12 MED ORDER — TERBINAFINE HCL 250 MG PO TABS
250.0000 mg | ORAL_TABLET | Freq: Every day | ORAL | Status: DC
  Administered 2013-10-13: 250 mg via ORAL
  Filled 2013-10-12: qty 1

## 2013-10-12 MED ORDER — LISINOPRIL 40 MG PO TABS
40.0000 mg | ORAL_TABLET | Freq: Every day | ORAL | Status: DC
  Administered 2013-10-13: 40 mg via ORAL
  Filled 2013-10-12: qty 1

## 2013-10-12 MED ORDER — HEPARIN BOLUS VIA INFUSION
4000.0000 [IU] | Freq: Once | INTRAVENOUS | Status: AC
  Administered 2013-10-12: 4000 [IU] via INTRAVENOUS
  Filled 2013-10-12: qty 4000

## 2013-10-12 MED ORDER — FINASTERIDE 5 MG PO TABS
5.0000 mg | ORAL_TABLET | Freq: Every day | ORAL | Status: DC
  Administered 2013-10-13: 5 mg via ORAL
  Filled 2013-10-12: qty 1

## 2013-10-12 NOTE — ED Notes (Signed)
Diet tray ordered for pt 
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
## 2013-10-12 NOTE — Progress Notes (Signed)
Bilateral lower extremity venous duplex completed.  Right:  No evidence of DVT, superficial thrombosis, or Baker's cyst.  Left: DVT noted in the common femoral, profunda, femoral, popliteal, gastrocnemius, posterior tibial, and peroneal veins.  There appears to be superficial thrombosis in the proximal lesser saphenous vein.  No Baker's cyst.

## 2013-10-12 NOTE — ED Notes (Signed)
Ambulated patient around the nurses station.   Patient tolerated well.

## 2013-10-12 NOTE — H&P (Signed)
FMTS Attending Note  I personally saw and evaluated the patient. The plan of care was discussed with the resident team. I agree with the assessment and plan as documented by the resident.   68 year old male with past medical history of hypertension, trigeminal neuralgia, and BPH presents with one week history of back pain, and left lower leg pain/swelling after a fall while mowing his lawn week ago, patient was subsequently found to have left lower extremity DVT on venous Doppler, patient was noted to family medicine teaching service for observation, patient currently reports no shortness of breath, no chest pain, back pain is currently controlled after a dose of ibuprofen, please refer to resident note for additional history of present illness.  Vitals: Reviewed General: Pleasant Caucasian male, no acute distress HEENT: Normocephalic, pupils equal in size bilaterally, no scleral icterus, extraocular movements are intact, moist mucous membranes Cardiac: Regular rate and rhythm, S1 and S2 present, no murmurs, no heaves or thrills Respiratory: Clear to auscultation bilaterally, normal effort Abdomen: Soft, nontender, bowel sounds present Extremities: 2+ radial pulses bilaterally, 1+ edema of the left lower extremity extending up to the left thigh, 2 posterior cells pedis pulses bilaterally MSK: No lumbar spinal tenderness, left lower extremity log roll negative, no pain elicited with left FABER/FADIR Skin: No rash  Reviewed lab work and imaging  Assessment and plan: 68 male admitted with left lower extremity DVT 1. DVT-admit for observation overnight, patient started on heparin drip, will consider transition to by mouth Xarelto tomorrow, patient currently shows no clinical signs for pulmonary embolism 2. BPH-agree with continuation of home medications 3. Hypertension-stable at home lisinopril  Dossie Arbour M.D.
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
## 2013-10-12 NOTE — Progress Notes (Signed)
Pt arrived to 5w27 from ED, a/ox4, denies nausea, c/o LL pain 4/10, instructed on usage of call light and room surroundings.

## 2013-10-12 NOTE — Progress Notes (Signed)
Pt states he self caths himself each night per his urologist. Bladder scan showed 860. On call physician called - order for I&O cath placed. Pt voided 425. I&O cath provided 650. Pt in no s/s of distress.

## 2013-10-12 NOTE — ED Provider Notes (Addendum)
Patient presented to the ER with back and leg pain after a fall. The fall occurred one week ago. Patient is complaining of pain in the lower back, more on the left side that goes down the leg. He has noticed increased swelling of the leg and diffuse tenderness.  Face to face Exam: HEENT - PERRLA Lungs - CTAB Heart - RRR, no M/R/G Abd - S/NT/ND Neuro - alert, oriented x3 Musculoskeletal - mild, soft tissue tenderness in lumbar region. Diffuse soft tissue tenderness left leg with diffuse edema  Plan: X-ray of hip and lumbar spine negative. Venous duplex performed to evaluate for posttraumatic DVT. Study shows extensive clot. Patient will require admission for further management.  Orpah Greek, MD 10/12/13 [place], MD 10/12/13 [phone]

## 2013-10-12 NOTE — ED Notes (Signed)
Per Patient: Reports fall approx.1 one week ago while mowing grass. States he has been having severe back pain ever since. States he has a hard time ambulating due to pain. Ax4, NAD.
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
## 2013-10-13 DIAGNOSIS — I82409 Acute embolism and thrombosis of unspecified deep veins of unspecified lower extremity: Secondary | ICD-10-CM | POA: Diagnosis not present

## 2013-10-13 DIAGNOSIS — N529 Male erectile dysfunction, unspecified: Secondary | ICD-10-CM | POA: Diagnosis not present

## 2013-10-13 DIAGNOSIS — I10 Essential (primary) hypertension: Secondary | ICD-10-CM | POA: Diagnosis not present

## 2013-10-13 LAB — CBC WITH DIFFERENTIAL/PLATELET
Basophils Absolute: 0 10*3/uL (ref 0.0–0.1)
Basophils Relative: 0 % (ref 0–1)
Eosinophils Absolute: 0.5 10*3/uL (ref 0.0–0.7)
Eosinophils Relative: 6 % — ABNORMAL HIGH (ref 0–5)
HEMATOCRIT: 38.3 % — AB (ref 39.0–52.0)
Hemoglobin: 12.2 g/dL — ABNORMAL LOW (ref 13.0–17.0)
LYMPHS PCT: 18 % (ref 12–46)
Lymphs Abs: 1.4 10*3/uL (ref 0.7–4.0)
MCH: 28 pg (ref 26.0–34.0)
MCHC: 31.9 g/dL (ref 30.0–36.0)
MCV: 87.8 fL (ref 78.0–100.0)
MONO ABS: 0.7 10*3/uL (ref 0.1–1.0)
Monocytes Relative: 9 % (ref 3–12)
NEUTROS ABS: 5.1 10*3/uL (ref 1.7–7.7)
Neutrophils Relative %: 67 % (ref 43–77)
Platelets: 151 10*3/uL (ref 150–400)
RBC: 4.36 MIL/uL (ref 4.22–5.81)
RDW: 12.9 % (ref 11.5–15.5)
WBC: 7.7 10*3/uL (ref 4.0–10.5)

## 2013-10-13 LAB — HEPARIN LEVEL (UNFRACTIONATED)
Heparin Unfractionated: 0.28 IU/mL — ABNORMAL LOW (ref 0.30–0.70)
Heparin Unfractionated: 0.41 IU/mL (ref 0.30–0.70)

## 2013-10-13 LAB — BASIC METABOLIC PANEL
ANION GAP: 13 (ref 5–15)
BUN: 26 mg/dL — AB (ref 6–23)
CO2: 25 meq/L (ref 19–32)
CREATININE: 1.24 mg/dL (ref 0.50–1.35)
Calcium: 9.1 mg/dL (ref 8.4–10.5)
Chloride: 104 mEq/L (ref 96–112)
GFR calc Af Amer: 68 mL/min — ABNORMAL LOW (ref >90)
GFR calc non Af Amer: 59 mL/min — ABNORMAL LOW (ref >90)
Glucose, Bld: 106 mg/dL — ABNORMAL HIGH (ref 70–99)
POTASSIUM: 4.9 meq/L (ref 3.7–5.3)
Sodium: 142 mEq/L (ref 137–147)

## 2013-10-13 MED ORDER — RIVAROXABAN 15 MG PO TABS
15.0000 mg | ORAL_TABLET | Freq: Two times a day (BID) | ORAL | Status: DC
  Administered 2013-10-13: 15 mg via ORAL
  Filled 2013-10-13 (×2): qty 1

## 2013-10-13 MED ORDER — HYDROCODONE-ACETAMINOPHEN 5-325 MG PO TABS: 1.0000 | ORAL_TABLET | Freq: Four times a day (QID) | ORAL | Status: DC | PRN

## 2013-10-13 MED ORDER — RIVAROXABAN 15 MG PO TABS: 15.0000 mg | ORAL_TABLET | Freq: Two times a day (BID) | ORAL | Status: DC

## 2013-10-13 NOTE — Progress Notes (Signed)
Patient discharge teaching given, including activity, diet, follow-up appoints, and medications. Patient verbalized understanding of all discharge instructions. IV access was d/c'd. Vitals are stable. Skin is intact except as charted in most recent assessments. Home medications returned from pharmacy. Pt to be escorted out by NT, to be driven home by family.

## 2013-10-13 NOTE — Discharge Instructions (Signed)
Information on my medicine - XARELTO (rivaroxaban)  This medication education was reviewed with me or my healthcare representative as part of my discharge preparation.  The pharmacist that spoke with me during my hospital stay was:  Cornelio Parkerson M, RPH  WHY WAS [place]? Xarelto was prescribed to treat blood clots that may have been found in the veins of your legs (deep vein thrombosis) or in your lungs (pulmonary embolism) and to reduce the risk of them occurring again.  What do you need to know about Xarelto? The starting dose is one 15 mg tablet taken TWICE daily with food for the FIRST 21 DAYS then on (enter date)  11/03/13  the dose is changed to one 20 mg tablet taken ONCE A DAY with your evening meal.  DO NOT stop taking Xarelto without talking to the health care provider who prescribed the medication.  Refill your prescription for 20 mg tablets before you run out.  After discharge, you should have regular check-up appointments with your healthcare provider that is prescribing your Xarelto.  In the future your dose may need to be changed if your kidney function changes by a significant amount.  What do you do if you miss a dose? If you are taking Xarelto TWICE DAILY and you miss a dose, take it as soon as you remember. You may take two 15 mg tablets (total 30 mg) at the same time then resume your regularly scheduled 15 mg twice daily the next day.  If you are taking Xarelto ONCE DAILY and you miss a dose, take it as soon as you remember on the same day then continue your regularly scheduled once daily regimen the next day. Do not take two doses of Xarelto at the same time.   Important Safety Information Xarelto is a blood thinner medicine that can cause bleeding. You should call your healthcare provider right away if you experience any of the following:   Bleeding from an injury or your nose that does not stop.   Unusual colored urine (red or dark brown) or  unusual colored stools (red or black).   Unusual bruising for unknown reasons.   A serious fall or if you hit your head (even if there is no bleeding).  Some medicines may interact with Xarelto and might increase your risk of bleeding while on Xarelto. To help avoid this, consult your healthcare provider or pharmacist prior to using any new prescription or non-prescription medications, including herbals, vitamins, non-steroidal anti-inflammatory drugs (NSAIDs) and supplements.  This website has more information on Xarelto: [email or web address]

## 2013-10-13 NOTE — Progress Notes (Signed)
ANTICOAGULATION CONSULT NOTE - Follow Up Consult  Pharmacy Consult for Heparin  Indication: DVT  Allergies  Allergen Reactions  . Codeine Sulfate Nausea Only    REACTION: nausea    Patient Measurements: Height: 6' (182.9 cm) Weight: 190 lb (86.183 kg) IBW/kg (Calculated) : 77.6  Vital Signs: Temp: 98.8 F (37.1 C) (07/10 2200) Temp src: Oral (07/10 2200) BP: 113/71 mmHg (07/10 2200) Pulse Rate: 72 (07/10 2200)  Labs:  Recent Labs  10/12/13 1207 10/12/13 2302  HGB 12.5*  --   HCT 38.9*  --   PLT 153  --   LABPROT 13.8  --   INR 1.06  --   HEPARINUNFRC  --  0.28*  CREATININE 1.39*  --     Estimated Creatinine Clearance: 57.4 ml/min (by C-G formula based on Cr of 1.39).   Medications:  Heparin 1200 units/hr  Assessment: 68 y/o M on heparin for acute DVT if left extremity. First HL is 0.28, other labs as above.   Goal of Therapy:  Heparin level 0.3-0.7 units/ml Monitor platelets by anticoagulation protocol: Yes   Plan:  -Increase heparin drip to 1350 units/hr -0700 HL -Daily CBC/HL -Monitor for bleeding  Narda Bonds 10/13/2013,12:11 AM

## 2013-10-13 NOTE — Discharge Summary (Signed)
[facility] Discharge Summary  Patient name: [name] Medical record number: [id number] Date of birth: 03-05-1946 Age: 68 y.o. Gender: male Date of Admission: 10/12/2013  Date of Discharge: 10/13/2013 Admitting Physician: Lupita Dawn, MD  Primary Care Provider: Elsie Stain, MD Consultants: Vascular surgery  Indication for Hospitalization:  Left lower extremity DVT  Discharge Diagnoses/Problem List:  Patient Active Problem List   Diagnosis Date Noted  . Acute deep vein thrombosis (DVT) of proximal vein of left lower extremity 10/12/2013  . DVT (deep venous thrombosis) 10/12/2013  . Recurrent oral ulcers 04/23/2013  . Erectile dysfunction 04/23/2013  . Routine general medical examination at a health care facility 04/16/2012  . Trigeminal neuralgia 12/13/2011  . Inguinal hernia 04/07/2011  . Lower urinary tract symptoms (LUTS) 09/16/2010  . HYPERLIPIDEMIA 01/31/2007  . COLONIC POLYPS, ADENOMATOUS, BENIGN 01/10/2007  . DIVERTICULOSIS, COLON 12/26/2006  . HERPES SIMPLEX, UNCOMPLICATED 26/37/8588  . TREMOR, ESSENTIAL 06/24/2006  . HEARING DEFICIT 06/24/2006  . HYPERTENSION 06/24/2006  . GERD 06/24/2006  . MIGRAINE, COMMON W/O INTRACTABLE MIGRAINE 04/05/1996   Disposition: Discharged home  Discharge Condition: Stable  Discharge Exam:  General: Lying in bed in NAD.  HEENT: Normocephalic. Atraumatic. PERRL. EOMI. Oropharynx clear. MMM.  Cardiovascular: RRR, no murmurs, rubs, or gallops noted.  Respiratory: CTAB. NO wheezing, crackles, or rhonchi noted. No increased WOB. Good air movement.  Abdomen: +BS. Soft, non-distended. Non-tender. No rebound or guarding.  Ext: 1+ pitting edema LLE with mild pain on calf palpation Skin: Abrasion of the left forearm without signs of infection. No erythema or drainage noted.  Neuro: A&O. Normal speech. Able to walk unassisted  Brief Hospital Course:  [name] is a 68 y.o. male presenting with acute  DVT of the left leg. PMH is significant for HTN, trigeminal neuralgia, and BPH.   Extensive clot burden was noted on lower extremity doppler on presentation. Given that the DVT is proximal and quite large, there was the concern for possible emobolization. Vascular surgery was contacted and suggested anticoagulation and observation. So he was admitted and a heparin infusion was started. He remained hemodynamically stable overnight without signs or symptoms of pulmonary embolism, so heparin was transitioned to xarelto and he was discharged 10/13/13.  Finasteride and flomax were continued for BPH, lisinopril for HTN, and lyrica for stable trigeminal neuralgia.   Issues for Follow Up:  - Monitor renal function and adjust dose of xarelto accordingly. Will need prescription for 20mg  dose 3 weeks from today (Nov 03, 2013). Will need 3-6 months of therapy for this unprovoked DVT.   Significant Procedures: None  Significant Labs and Imaging:   Recent Labs Lab 10/12/13 1207 10/13/13 0629  WBC 8.0 7.7  HGB 12.5* 12.2*  HCT 38.9* 38.3*  PLT 153 151    Recent Labs Lab 10/12/13 1207 10/13/13 0629  NA 141 142  K 4.9 4.9  CL 103 104  CO2 26 25  GLUCOSE 97 106*  BUN 25* 26*  CREATININE 1.39* 1.24  CALCIUM 9.0 9.1   X-ray of the lumbar spine: Mild osteophytic changes are noted at L3-4. Disc space narrowing is noted at L5-S1. No acute fracture or spondylolisthesis is noted  X-ray of the hips: Minimal osteoarthritic changes. No acute osseous abnormalities.  Bilateral lower extremity venous duplex: Right: No evidence of DVT, superficial thrombosis, or Baker's cyst. Left: DVT noted in the common femoral, profunda, femoral, popliteal, gastrocnemius, posterior tibial, and peroneal veins. There appears to be superficial thrombosis in the proximal  lesser saphenous vein. No Baker's cyst.    Results/Tests Pending at Time of Discharge: None  Discharge Medications:    Medication List          acetaminophen 500 MG tablet  Commonly known as:  TYLENOL  Take 1,000 mg by mouth 2 (two) times daily as needed.     AZO CRANBERRY URINARY TRACT PO  Take 2 tablets by mouth daily.     finasteride 5 MG tablet  Commonly known as:  PROSCAR  Take 1 tablet (5 mg total) by mouth daily.     Fish Oil 1000 MG Caps  Take by mouth daily.     HYDROcodone-acetaminophen 5-325 MG per tablet  Commonly known as:  NORCO  Take 1 tablet by mouth every 6 (six) hours as needed for moderate pain.     lisinopril 40 MG tablet  Commonly known as:  PRINIVIL,ZESTRIL  Take 1 tablet (40 mg total) by mouth daily.     pregabalin 150 MG capsule  Commonly known as:  LYRICA  Take 1 capsule (150 mg total) by mouth 3 (three) times daily.     Rivaroxaban 15 MG Tabs tablet  Commonly known as:  XARELTO  Take 1 tablet (15 mg total) by mouth 2 (two) times daily.     SLEEP AID 25 MG tablet  Generic drug:  doxylamine (Sleep)  Take 25 mg by mouth at bedtime as needed.     tadalafil 20 MG tablet  Commonly known as:  CIALIS  Take 0.5-1 tablets (10-20 mg total) by mouth every other day as needed for erectile dysfunction.     tamsulosin 0.4 MG Caps capsule  Commonly known as:  FLOMAX  TAKE 1 CAPSULE BY MOUTH DAILY     terbinafine 250 MG tablet  Commonly known as:  LAMISIL  Take 250 mg by mouth daily.     vitamin E 400 UNIT capsule  Take 400 Units by mouth daily.       Discharge Instructions: Please refer to Patient Instructions section of EMR for full details.  Patient was counseled important signs and symptoms that should prompt return to medical care, changes in medications, dietary instructions, activity restrictions, and follow up appointments.   Follow-Up Appointments:     Follow-up Information   Schedule an appointment as soon as possible for a visit with Elsie Stain, MD.   Specialty:  [facility] Medicine   Contact information:   [place] [address] [phone]        Vance Gather, MD 10/13/2013, 3:39 PM PGY-2 [place]

## 2013-10-13 NOTE — Discharge Summary (Signed)
[facility]MTS Attending Note  I personally saw and evaluated the patient. The plan of care was discussed with the resident team. I agree with the assessment and plan as documented by the resident.  Transition patient to Xarelto. Stable for discharge.    Dossie Arbour MD

## 2013-10-13 NOTE — Progress Notes (Signed)
ANTICOAGULATION CONSULT NOTE - Follow Up Consult  Pharmacy Consult for heparin dosing Indication: DVT  Allergies  Allergen Reactions  . Codeine Sulfate Nausea Only    REACTION: nausea    Patient Measurements: Height: 6' (182.9 cm) Weight: 190 lb (86.183 kg) IBW/kg (Calculated) : 77.6 Heparin Dosing Weight: 86kg  Vital Signs: Temp: 98.1 F (36.7 C) (07/11 0426) Temp src: Oral (07/11 0426) BP: 107/68 mmHg (07/11 0426) Pulse Rate: 65 (07/11 0426)  Labs:  Recent Labs  10/12/13 1207 10/12/13 2302 10/13/13 0629 10/13/13 0700  HGB 12.5*  --  12.2*  --   HCT 38.9*  --  38.3*  --   PLT 153  --  151  --   LABPROT 13.8  --   --   --   INR 1.06  --   --   --   HEPARINUNFRC  --  0.28*  --  0.41  CREATININE 1.39*  --  1.24  --     Estimated Creatinine Clearance: 64.3 ml/min (by C-G formula based on Cr of 1.24).   Medications:  Prescriptions prior to admission  Medication Sig Dispense Refill  . acetaminophen (TYLENOL) 500 MG tablet Take 1,000 mg by mouth 2 (two) times daily as needed.        . Cranberry-Vitamin C (AZO CRANBERRY URINARY TRACT PO) Take 2 tablets by mouth daily.      . Doxylamine Succinate, Sleep, (SLEEP AID) 25 MG tablet Take 25 mg by mouth at bedtime as needed.        . finasteride (PROSCAR) 5 MG tablet Take 1 tablet (5 mg total) by mouth daily.  90 tablet  3  . lisinopril (PRINIVIL,ZESTRIL) 40 MG tablet Take 1 tablet (40 mg total) by mouth daily.  90 tablet  3  . Omega-3 Fatty Acids (FISH OIL) 1000 MG CAPS Take by mouth daily.        . pregabalin (LYRICA) 150 MG capsule Take 1 capsule (150 mg total) by mouth 3 (three) times daily.  90 capsule  5  . tamsulosin (FLOMAX) 0.4 MG CAPS capsule TAKE 1 CAPSULE BY MOUTH DAILY  90 capsule  3  . terbinafine (LAMISIL) 250 MG tablet Take 250 mg by mouth daily.      . vitamin E 400 UNIT capsule Take 400 Units by mouth daily.        . tadalafil (CIALIS) 20 MG tablet Take 0.5-1 tablets (10-20 mg total) by mouth every other  day as needed for erectile dysfunction.  5 tablet  11   Assessment: 69yo M admitted 10/12/13 for left extremity DVT. Pharmacy consulted to dose heparin. Patient was given a 4000 unit bolus x 1 and started infusion at 1200 units/hr. First HL was sub-therapeutic at 0.28 and the infusion was increased to 1350 units/hr. Second HL is therapeutic at 0.41.   Goal of Therapy:  Heparin level 0.3-0.7 units/ml Monitor platelets by anticoagulation protocol: Yes   Plan:  Continue 1350 units/hr infusion F/u HL @ 1330 Check anti-Xa level daily while on heparin Continue to monitor H&H and platelets Monitor for s/sx of bleeding  Kysha Muralles M. Tachina Spoonemore, Pharm.D Clinical Pharmacy Resident Pager: [phone] 10/13/2013 .7:28 AM

## 2013-10-13 NOTE — Care Management Utilization Note (Signed)
UR completed.    Esmirna Ravan Wise Tenesha Garza, RN, BSN Phone #[phone]  

## 2013-10-15 NOTE — ED Provider Notes (Signed)
Medical screening examination/treatment/procedure(s) were performed by non-physician practitioner and as supervising physician I was immediately available for consultation/collaboration.   EKG Interpretation None        Orpah Greek, MD 10/15/13 [phone]

## 2013-10-16 ENCOUNTER — Ambulatory Visit (INDEPENDENT_AMBULATORY_CARE_PROVIDER_SITE_OTHER): Payer: Medicare Other | Admitting: Family Medicine

## 2013-10-16 ENCOUNTER — Encounter: Payer: Self-pay | Admitting: Family Medicine

## 2013-10-16 VITALS — BP 102/60 | HR 72 | Temp 98.0°F | Wt 197.2 lb

## 2013-10-16 DIAGNOSIS — M5489 Other dorsalgia: Secondary | ICD-10-CM

## 2013-10-16 DIAGNOSIS — I82402 Acute embolism and thrombosis of unspecified deep veins of left lower extremity: Secondary | ICD-10-CM

## 2013-10-16 DIAGNOSIS — M549 Dorsalgia, unspecified: Secondary | ICD-10-CM

## 2013-10-16 DIAGNOSIS — I82409 Acute embolism and thrombosis of unspecified deep veins of unspecified lower extremity: Secondary | ICD-10-CM

## 2013-10-16 MED ORDER — RIVAROXABAN 20 MG PO TABS: 20.0000 mg | ORAL_TABLET | Freq: Every day | ORAL | Status: DC

## 2013-10-16 MED ORDER — HYDROCODONE-ACETAMINOPHEN 5-325 MG PO TABS: 1.0000 | ORAL_TABLET | Freq: Four times a day (QID) | ORAL | Status: DC | PRN

## 2013-10-16 NOTE — Patient Instructions (Signed)
Keep taking 15mg  of xarelto twice a day.  When done with that, change to 20mg  once a day.  You'll likely be on the medicine for about 6 months total.   Take the hydrocodone only if needed in the meantime.  Rosaria Ferries will call about your referral. Recheck here with me in about 1 month at a visit.  If any bleeding or severe bruising, then notify me.  We'll make plans in about 4-5 months about coming off the medicine.  Take care.

## 2013-10-16 NOTE — Progress Notes (Signed)
Pre visit review using our clinic review tool, if applicable. No additional management support is needed unless otherwise documented below in the visit note.  [place] about 1 week before ER eval.  Night before ER visit, L calf pain started.  Went to ER with L leg>back pain.  DVT noted, [facility] imaging neg o/w, reviewed.  No cause known for DVT other than the fall.  No FH, no personal hx DVT.  Started on heparin, transitioned to xarelto.  Complaint. No bruising, no bleeding.  Routine cautions d/w pt.  No CP, not SOB. Swelling and leg pain some better in the meantime.    PMH and SH reviewed  ROS: See HPI, otherwise noncontributory.  Meds, vitals, and allergies reviewed.   nad ncat Mmm rrr ctab abd soft, not ttp Ext with no edema on R leg, L leg with 1+ pitting edema up to the mid shin.  Back w/o midline pain.  Grossly NV intact in the BLE.

## 2013-10-17 ENCOUNTER — Encounter: Payer: Self-pay | Admitting: Family Medicine

## 2013-10-17 NOTE — Assessment & Plan Note (Signed)
D/w pt.  He'll likely have ~6 months of tx.  3 weeks of BID xarelto, then 5 months of qd dosing.  Routine cautions given to patient. Okay for outpatient f/u.  I want to see him back in about 1 month. Along the way, we'll consider hypercoag panel and possible f/u u/s.  He agrees.  All questions answered.  His back pain is likely not related and is from the fall.  Likely benign msk strain, deep contusion, with dec in pain as he is active during the day and worse at night when the muscles tighten up.  D/w pt about stretching.  Should improve gradually.  He agrees.

## 2013-10-18 ENCOUNTER — Telehealth: Payer: Self-pay | Admitting: Family Medicine

## 2013-10-18 NOTE — Telephone Encounter (Signed)
I put in the referral prev.  I think the referrals are backed up and that caused the delay.  I would keep the OV as scheduled.  I'll ask for Rosaria Ferries to try to expedite his PT referral.  Thanks.

## 2013-10-18 NOTE — Telephone Encounter (Signed)
Patient says at the last OV, you mentioned PT.  He hasn't heard anything about getting set up with that and his back is still bothering him a lot at night.  He's having to get up around 3 a.m. to take a pain pill in order to get some sleep.  He says his leg is still swollen too and he neglected to tell you about a problem he is having with his other foot.  I suggested that he make an appt to come in to discuss his leg and foot problem.  Appt scheduled for Friday.  Didn't know if you need to follow up on the PT referral?

## 2013-10-18 NOTE — Telephone Encounter (Signed)
Pt would like to talk to you w/questions in reference to physical therapy. Could you please call him back? Thank you.

## 2013-10-19 ENCOUNTER — Ambulatory Visit (INDEPENDENT_AMBULATORY_CARE_PROVIDER_SITE_OTHER)
Admission: RE | Admit: 2013-10-19 | Discharge: 2013-10-19 | Disposition: A | Discharge status: Home / Self Care | Payer: Medicare Other | Source: Ambulatory Visit | Attending: Family Medicine | Admitting: Family Medicine

## 2013-10-19 ENCOUNTER — Ambulatory Visit (INDEPENDENT_AMBULATORY_CARE_PROVIDER_SITE_OTHER): Payer: Medicare Other | Admitting: Family Medicine

## 2013-10-19 ENCOUNTER — Encounter: Payer: Self-pay | Admitting: Family Medicine

## 2013-10-19 VITALS — BP 102/62 | HR 61 | Temp 97.7°F | Wt 197.2 lb

## 2013-10-19 DIAGNOSIS — M25579 Pain in unspecified ankle and joints of unspecified foot: Secondary | ICD-10-CM

## 2013-10-19 DIAGNOSIS — M25571 Pain in right ankle and joints of right foot: Secondary | ICD-10-CM

## 2013-10-19 NOTE — Patient Instructions (Signed)
Go to the lab on the way out.  We'll contact you with your xray report.  We may have to get you a pad for your shoes.  Let me see the xray first.

## 2013-10-19 NOTE — Progress Notes (Signed)
Pre visit review using our clinic review tool, if applicable. No additional management support is needed unless otherwise documented below in the visit note.  R foot pain, at/near the 4th MTP.  No trauma.  Going on for over 1 week.  No pain if not weight bearing.  Pain if weight bearing.  Didn't hurt when the sheet was laying on his foot.  It felt like he was stepping on a rock.  Pain is worse barefooted than in shoes.     Meds, vitals, and allergies reviewed.   ROS: See HPI.  Otherwise, noncontributory.  nad ncat L leg with edema as expected.   R foot with 4th MTP puffy and sore, bruised.  Normal inspection o/w.  Normal DP pulse, not ttp at the ankle.  Some dec in pain with placing a pad under the foot, proximal to the distal end of the 4th MT

## 2013-10-21 DIAGNOSIS — M25579 Pain in unspecified ankle and joints of unspecified foot: Secondary | ICD-10-CM | POA: Insufficient documentation

## 2013-10-21 NOTE — Assessment & Plan Note (Signed)
See notes on imaging. He'll likely benefit from a metatarsal pad for a dropped MT head.  D/w pt.

## 2013-10-24 ENCOUNTER — Other Ambulatory Visit: Payer: Self-pay | Admitting: Family Medicine

## 2013-10-24 ENCOUNTER — Telehealth: Payer: Self-pay | Admitting: Family Medicine

## 2013-10-24 DIAGNOSIS — M545 Low back pain: Secondary | ICD-10-CM

## 2013-10-24 MED ORDER — HYDROCODONE-ACETAMINOPHEN 5-325 MG PO TABS: 1.0000 | ORAL_TABLET | Freq: Four times a day (QID) | ORAL | Status: DC | PRN

## 2013-10-24 NOTE — Telephone Encounter (Signed)
Patient notified as instructed by telephone. Patient stated that he is set up for PT, but would also like a referral to ortho because the pain keeps him from sleeping at night and hard to get up in the morning. Patient stated that he is almost out of his pain medication and needs more to help with the pain. Please advise.

## 2013-10-24 NOTE — Telephone Encounter (Signed)
Please call in.  Thanks.   

## 2013-10-24 NOTE — Telephone Encounter (Signed)
Pt called stating his back is still hurting  He is having problems sleeping.  He wants to see if he could get a referral for an MRI

## 2013-10-24 NOTE — Telephone Encounter (Signed)
Patient notified that the referral coordinator will be in touch regarding his referral. Advised patient that a written script is up front for him to pick up.

## 2013-10-24 NOTE — Telephone Encounter (Signed)
MRI isn't indicated at this point.  If still in pain, then PT is indicated.  I'll refer to PT or we can have him see ortho in the meantime.  I'll put in those referral(s) if needed.

## 2013-10-24 NOTE — Telephone Encounter (Signed)
Rx called to pharmacy as instructed. 

## 2013-10-24 NOTE — Telephone Encounter (Signed)
Ordered. I printed an rx for hydrocodone for the short term. Thanks.

## 2013-10-24 NOTE — Telephone Encounter (Signed)
Received refill request electronically from pharmacy. Last refill 04/23/13 #91/5 refills, last office visit 10/19/13. Is it okay to refill?

## 2013-11-12 ENCOUNTER — Emergency Department (HOSPITAL_COMMUNITY)
Admission: EM | Admit: 2013-11-12 | Discharge: 2013-11-12 | Disposition: A | Discharge status: Home / Self Care | Payer: Medicare Other | Attending: Emergency Medicine | Admitting: Emergency Medicine

## 2013-11-12 ENCOUNTER — Encounter (HOSPITAL_COMMUNITY): Payer: Self-pay | Admitting: Emergency Medicine

## 2013-11-12 DIAGNOSIS — Z79899 Other long term (current) drug therapy: Secondary | ICD-10-CM | POA: Diagnosis not present

## 2013-11-12 DIAGNOSIS — Z85828 Personal history of other malignant neoplasm of skin: Secondary | ICD-10-CM | POA: Diagnosis not present

## 2013-11-12 DIAGNOSIS — Z8719 Personal history of other diseases of the digestive system: Secondary | ICD-10-CM | POA: Diagnosis not present

## 2013-11-12 DIAGNOSIS — Z86718 Personal history of other venous thrombosis and embolism: Secondary | ICD-10-CM | POA: Diagnosis not present

## 2013-11-12 DIAGNOSIS — Z87828 Personal history of other (healed) physical injury and trauma: Secondary | ICD-10-CM | POA: Insufficient documentation

## 2013-11-12 DIAGNOSIS — N390 Urinary tract infection, site not specified: Secondary | ICD-10-CM | POA: Diagnosis not present

## 2013-11-12 DIAGNOSIS — Z8781 Personal history of (healed) traumatic fracture: Secondary | ICD-10-CM | POA: Insufficient documentation

## 2013-11-12 DIAGNOSIS — R0602 Shortness of breath: Secondary | ICD-10-CM | POA: Diagnosis not present

## 2013-11-12 DIAGNOSIS — R300 Dysuria: Secondary | ICD-10-CM | POA: Insufficient documentation

## 2013-11-12 DIAGNOSIS — R509 Fever, unspecified: Secondary | ICD-10-CM | POA: Diagnosis not present

## 2013-11-12 DIAGNOSIS — I10 Essential (primary) hypertension: Secondary | ICD-10-CM | POA: Insufficient documentation

## 2013-11-12 LAB — URINALYSIS, ROUTINE W REFLEX MICROSCOPIC
Bilirubin Urine: NEGATIVE
Glucose, UA: NEGATIVE mg/dL
Ketones, ur: 15 mg/dL — AB
Nitrite: POSITIVE — AB
Protein, ur: 100 mg/dL — AB
Specific Gravity, Urine: 1.018 (ref 1.005–1.030)
Urobilinogen, UA: 1 mg/dL (ref 0.0–1.0)
pH: 5 (ref 5.0–8.0)

## 2013-11-12 LAB — COMPREHENSIVE METABOLIC PANEL
ALT: 20 U/L (ref 0–53)
ANION GAP: 15 (ref 5–15)
AST: 17 U/L (ref 0–37)
Albumin: 3.6 g/dL (ref 3.5–5.2)
Alkaline Phosphatase: 63 U/L (ref 39–117)
BILIRUBIN TOTAL: 0.5 mg/dL (ref 0.3–1.2)
BUN: 20 mg/dL (ref 6–23)
CO2: 23 meq/L (ref 19–32)
Calcium: 8.6 mg/dL (ref 8.4–10.5)
Chloride: 99 mEq/L (ref 96–112)
Creatinine, Ser: 1.26 mg/dL (ref 0.50–1.35)
GFR, EST AFRICAN AMERICAN: 67 mL/min — AB (ref >90)
GFR, EST NON AFRICAN AMERICAN: 58 mL/min — AB (ref >90)
GLUCOSE: 143 mg/dL — AB (ref 70–99)
Potassium: 3.7 mEq/L (ref 3.7–5.3)
Sodium: 137 mEq/L (ref 137–147)
TOTAL PROTEIN: 6.5 g/dL (ref 6.0–8.3)

## 2013-11-12 LAB — CBC WITH DIFFERENTIAL/PLATELET
Basophils Absolute: 0 10*3/uL (ref 0.0–0.1)
Basophils Relative: 0 % (ref 0–1)
Eosinophils Absolute: 0 10*3/uL (ref 0.0–0.7)
Eosinophils Relative: 0 % (ref 0–5)
HCT: 36.6 % — ABNORMAL LOW (ref 39.0–52.0)
Hemoglobin: 12.3 g/dL — ABNORMAL LOW (ref 13.0–17.0)
Lymphocytes Relative: 7 % — ABNORMAL LOW (ref 12–46)
Lymphs Abs: 0.5 10*3/uL — ABNORMAL LOW (ref 0.7–4.0)
MCH: 28.7 pg (ref 26.0–34.0)
MCHC: 33.6 g/dL (ref 30.0–36.0)
MCV: 85.5 fL (ref 78.0–100.0)
Monocytes Absolute: 0.4 10*3/uL (ref 0.1–1.0)
Monocytes Relative: 6 % (ref 3–12)
Neutro Abs: 6.5 10*3/uL (ref 1.7–7.7)
Neutrophils Relative %: 87 % — ABNORMAL HIGH (ref 43–77)
Platelets: 167 10*3/uL (ref 150–400)
RBC: 4.28 MIL/uL (ref 4.22–5.81)
RDW: 13.3 % (ref 11.5–15.5)
WBC: 7.5 10*3/uL (ref 4.0–10.5)

## 2013-11-12 LAB — URINE MICROSCOPIC-ADD ON

## 2013-11-12 MED ORDER — ONDANSETRON HCL 4 MG/2ML IJ SOLN
4.0000 mg | Freq: Once | INTRAMUSCULAR | Status: AC
  Administered 2013-11-12: 4 mg via INTRAVENOUS
  Filled 2013-11-12: qty 2

## 2013-11-12 MED ORDER — CEFTRIAXONE SODIUM 1 G IJ SOLR
1.0000 g | Freq: Once | INTRAMUSCULAR | Status: AC
  Administered 2013-11-12: 1 g via INTRAVENOUS
  Filled 2013-11-12: qty 10

## 2013-11-12 MED ORDER — ACETAMINOPHEN 325 MG PO TABS
650.0000 mg | ORAL_TABLET | Freq: Once | ORAL | Status: AC
  Administered 2013-11-12: 650 mg via ORAL
  Filled 2013-11-12: qty 2

## 2013-11-12 MED ORDER — CEFUROXIME AXETIL 250 MG PO TABS: 250.0000 mg | ORAL_TABLET | Freq: Two times a day (BID) | ORAL | Status: DC

## 2013-11-12 MED ORDER — SODIUM CHLORIDE 0.9 % IV BOLUS (SEPSIS)
500.0000 mL | Freq: Once | INTRAVENOUS | Status: AC
  Administered 2013-11-12: 500 mL via INTRAVENOUS

## 2013-11-12 NOTE — ED Notes (Addendum)
Fever sob vomiting chills staretd last night has to cath himself and feels that he may have uti also having back problems states has been having dysuria and has been having to cath himself at night
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
## 2013-11-12 NOTE — Discharge Instructions (Signed)

## 2013-11-14 LAB — URINE CULTURE

## 2013-11-15 ENCOUNTER — Telehealth (HOSPITAL_BASED_OUTPATIENT_CLINIC_OR_DEPARTMENT_OTHER): Payer: Self-pay | Admitting: Emergency Medicine

## 2013-11-15 NOTE — Telephone Encounter (Signed)
Post ED Visit - Positive Culture Follow-up  Culture report reviewed by antimicrobial stewardship pharmacist: []  Wes Dulaney, Pharm.D., BCPS []  Heide Guile, Pharm.D., BCPS []  Alycia Rossetti, Pharm.D., BCPS []  [place], Pharm.D., BCPS, AAHIVP []  Legrand Como, Pharm.D., BCPS, AAHIVP [x]  Hassie Bruce, Pharm.D. []  Cassie Nicole Kindred, Pharm.D.  Positive Urine culture Treated with Cefuroxime, organism sensitive to the same and no further patient follow-up is required at this time.  Ernesta Amble 11/15/2013, 6:59 PM

## 2013-11-16 ENCOUNTER — Encounter: Payer: Self-pay | Admitting: Family Medicine

## 2013-11-16 ENCOUNTER — Ambulatory Visit (INDEPENDENT_AMBULATORY_CARE_PROVIDER_SITE_OTHER): Payer: Medicare Other | Admitting: Family Medicine

## 2013-11-16 VITALS — BP 114/70 | HR 64 | Temp 97.8°F | Wt 197.8 lb

## 2013-11-16 DIAGNOSIS — I824Y9 Acute embolism and thrombosis of unspecified deep veins of unspecified proximal lower extremity: Secondary | ICD-10-CM

## 2013-11-16 DIAGNOSIS — R3989 Other symptoms and signs involving the genitourinary system: Secondary | ICD-10-CM

## 2013-11-16 DIAGNOSIS — R399 Unspecified symptoms and signs involving the genitourinary system: Secondary | ICD-10-CM

## 2013-11-16 DIAGNOSIS — I824Y2 Acute embolism and thrombosis of unspecified deep veins of left proximal lower extremity: Secondary | ICD-10-CM

## 2013-11-16 DIAGNOSIS — M545 Low back pain, unspecified: Secondary | ICD-10-CM

## 2013-11-16 NOTE — Progress Notes (Signed)
Pre visit review using our clinic review tool, if applicable. No additional management support is needed unless otherwise documented below in the visit note.  Seen at ER with UTI.  STAPHYLOCOCCUS SPECIES on ucx.  Started on ceftin in meantime.  Still having to do self cath.  Urinary sx are resolved, no burning, no fevers.  He feels better.  He still has ceftin to finish the rx.    Back pain.  Seen by ortho prev/recently.  Started on prednisone and flexeril in the meantime. He went to PT but it didn't help much.  Massages haven't helped.  Back pain is mainly at night.  Lower back. More pain when getting out of the bed.  As the day goes on, he does better.  "The more I do, the better I feel."  Meds, vitals, and allergies reviewed.   ROS: See HPI.  Otherwise, noncontributory.  nad ncat Mmm rrr ctab abd soft, Murphy neg, no rebound No L leg edema, legs are symmetric now.

## 2013-11-16 NOTE — Patient Instructions (Signed)
Recheck in about 3 months.  We'll talk about the follow up leg ultrasound and extra blood tests at that point.   Take care.

## 2013-11-18 DIAGNOSIS — M549 Dorsalgia, unspecified: Secondary | ICD-10-CM | POA: Insufficient documentation

## 2013-11-18 NOTE — Assessment & Plan Note (Signed)
Per ortho.  

## 2013-11-18 NOTE — Assessment & Plan Note (Signed)
Now resolved.  Finish abx.

## 2013-11-18 NOTE — Assessment & Plan Note (Signed)
We can consider u/s and hypercoag w/u in about 3 months.  He'll still be on anticoagulation at that point.  D/w pt.

## 2013-12-17 ENCOUNTER — Other Ambulatory Visit: Payer: Self-pay | Admitting: Urology

## 2013-12-17 DIAGNOSIS — N138 Other obstructive and reflux uropathy: Secondary | ICD-10-CM

## 2013-12-17 DIAGNOSIS — N403 Nodular prostate with lower urinary tract symptoms: Secondary | ICD-10-CM

## 2013-12-17 DIAGNOSIS — R339 Retention of urine, unspecified: Secondary | ICD-10-CM

## 2013-12-21 ENCOUNTER — Ambulatory Visit
Admission: RE | Admit: 2013-12-21 | Discharge: 2013-12-21 | Disposition: A | Discharge status: Home / Self Care | Payer: Medicare Other | Source: Ambulatory Visit | Attending: Urology | Admitting: Urology

## 2013-12-21 DIAGNOSIS — N403 Nodular prostate with lower urinary tract symptoms: Secondary | ICD-10-CM

## 2013-12-21 DIAGNOSIS — N138 Other obstructive and reflux uropathy: Secondary | ICD-10-CM

## 2013-12-21 DIAGNOSIS — R339 Retention of urine, unspecified: Secondary | ICD-10-CM

## 2014-01-01 ENCOUNTER — Encounter (HOSPITAL_COMMUNITY): Payer: Self-pay | Admitting: Emergency Medicine

## 2014-01-01 ENCOUNTER — Emergency Department (HOSPITAL_COMMUNITY)
Admission: EM | Admit: 2014-01-01 | Discharge: 2014-01-03 | Disposition: A | Discharge status: Home / Self Care | Payer: Medicare Other | Attending: Emergency Medicine | Admitting: Emergency Medicine

## 2014-01-01 DIAGNOSIS — K5289 Other specified noninfective gastroenteritis and colitis: Secondary | ICD-10-CM | POA: Insufficient documentation

## 2014-01-01 DIAGNOSIS — Z86718 Personal history of other venous thrombosis and embolism: Secondary | ICD-10-CM | POA: Diagnosis not present

## 2014-01-01 DIAGNOSIS — Z7901 Long term (current) use of anticoagulants: Secondary | ICD-10-CM | POA: Diagnosis not present

## 2014-01-01 DIAGNOSIS — K529 Noninfective gastroenteritis and colitis, unspecified: Secondary | ICD-10-CM

## 2014-01-01 DIAGNOSIS — G500 Trigeminal neuralgia: Secondary | ICD-10-CM | POA: Diagnosis not present

## 2014-01-01 DIAGNOSIS — G8929 Other chronic pain: Secondary | ICD-10-CM | POA: Insufficient documentation

## 2014-01-01 DIAGNOSIS — K219 Gastro-esophageal reflux disease without esophagitis: Secondary | ICD-10-CM | POA: Insufficient documentation

## 2014-01-01 DIAGNOSIS — R112 Nausea with vomiting, unspecified: Secondary | ICD-10-CM | POA: Diagnosis present

## 2014-01-01 DIAGNOSIS — I10 Essential (primary) hypertension: Secondary | ICD-10-CM | POA: Insufficient documentation

## 2014-01-01 DIAGNOSIS — Z7952 Long term (current) use of systemic steroids: Secondary | ICD-10-CM | POA: Insufficient documentation

## 2014-01-01 DIAGNOSIS — Z9889 Other specified postprocedural states: Secondary | ICD-10-CM | POA: Diagnosis not present

## 2014-01-01 DIAGNOSIS — Z85828 Personal history of other malignant neoplasm of skin: Secondary | ICD-10-CM | POA: Diagnosis not present

## 2014-01-01 DIAGNOSIS — G43909 Migraine, unspecified, not intractable, without status migrainosus: Secondary | ICD-10-CM | POA: Diagnosis not present

## 2014-01-01 DIAGNOSIS — Z79899 Other long term (current) drug therapy: Secondary | ICD-10-CM | POA: Insufficient documentation

## 2014-01-01 DIAGNOSIS — IMO0002 Reserved for concepts with insufficient information to code with codable children: Secondary | ICD-10-CM | POA: Diagnosis not present

## 2014-01-01 DIAGNOSIS — Z8781 Personal history of (healed) traumatic fracture: Secondary | ICD-10-CM | POA: Diagnosis not present

## 2014-01-01 LAB — I-STAT TROPONIN, ED: Troponin i, poc: 0 ng/mL (ref 0.00–0.08)

## 2014-01-01 LAB — LIPASE, BLOOD: Lipase: 36 U/L (ref 11–59)

## 2014-01-01 LAB — URINALYSIS, ROUTINE W REFLEX MICROSCOPIC
Bilirubin Urine: NEGATIVE
Glucose, UA: NEGATIVE mg/dL
HGB URINE DIPSTICK: NEGATIVE
KETONES UR: NEGATIVE mg/dL
Leukocytes, UA: NEGATIVE
Nitrite: NEGATIVE
PH: 7.5 (ref 5.0–8.0)
Protein, ur: NEGATIVE mg/dL
Specific Gravity, Urine: 1.009 (ref 1.005–1.030)
Urobilinogen, UA: 0.2 mg/dL (ref 0.0–1.0)

## 2014-01-01 LAB — COMPREHENSIVE METABOLIC PANEL
ALK PHOS: 62 U/L (ref 39–117)
ALT: 17 U/L (ref 0–53)
AST: 19 U/L (ref 0–37)
Albumin: 4.1 g/dL (ref 3.5–5.2)
Anion gap: 12 (ref 5–15)
BUN: 12 mg/dL (ref 6–23)
CO2: 22 mEq/L (ref 19–32)
Calcium: 9.6 mg/dL (ref 8.4–10.5)
Chloride: 106 mEq/L (ref 96–112)
Creatinine, Ser: 1.24 mg/dL (ref 0.50–1.35)
GFR calc non Af Amer: 59 mL/min — ABNORMAL LOW (ref >90)
GFR, EST AFRICAN AMERICAN: 68 mL/min — AB (ref >90)
GLUCOSE: 109 mg/dL — AB (ref 70–99)
POTASSIUM: 4.2 meq/L (ref 3.7–5.3)
Sodium: 140 mEq/L (ref 137–147)
Total Bilirubin: 0.4 mg/dL (ref 0.3–1.2)
Total Protein: 6.8 g/dL (ref 6.0–8.3)

## 2014-01-01 LAB — CBC WITH DIFFERENTIAL/PLATELET
Basophils Absolute: 0 10*3/uL (ref 0.0–0.1)
Basophils Relative: 1 % (ref 0–1)
EOS ABS: 0.1 10*3/uL (ref 0.0–0.7)
Eosinophils Relative: 2 % (ref 0–5)
HEMATOCRIT: 38.6 % — AB (ref 39.0–52.0)
HEMOGLOBIN: 13.3 g/dL (ref 13.0–17.0)
LYMPHS ABS: 1.1 10*3/uL (ref 0.7–4.0)
Lymphocytes Relative: 19 % (ref 12–46)
MCH: 28.4 pg (ref 26.0–34.0)
MCHC: 34.5 g/dL (ref 30.0–36.0)
MCV: 82.3 fL (ref 78.0–100.0)
MONOS PCT: 6 % (ref 3–12)
Monocytes Absolute: 0.3 10*3/uL (ref 0.1–1.0)
NEUTROS PCT: 74 % (ref 43–77)
Neutro Abs: 4.2 10*3/uL (ref 1.7–7.7)
Platelets: 161 10*3/uL (ref 150–400)
RBC: 4.69 MIL/uL (ref 4.22–5.81)
RDW: 13.5 % (ref 11.5–15.5)
WBC: 5.7 10*3/uL (ref 4.0–10.5)

## 2014-01-01 MED ORDER — ONDANSETRON HCL 4 MG/2ML IJ SOLN
4.0000 mg | Freq: Once | INTRAMUSCULAR | Status: AC
  Administered 2014-01-01: 4 mg via INTRAVENOUS
  Filled 2014-01-01: qty 2

## 2014-01-01 MED ORDER — ONDANSETRON 4 MG PO TBDP: 4.0000 mg | ORAL_TABLET | Freq: Three times a day (TID) | ORAL | Status: DC | PRN

## 2014-01-01 MED ORDER — SODIUM CHLORIDE 0.9 % IV BOLUS (SEPSIS)
1000.0000 mL | Freq: Once | INTRAVENOUS | Status: AC
  Administered 2014-01-01: 1000 mL via INTRAVENOUS

## 2014-01-01 MED ORDER — PROMETHAZINE HCL 25 MG/ML IJ SOLN
12.5000 mg | Freq: Once | INTRAMUSCULAR | Status: AC
  Administered 2014-01-01: 12.5 mg via INTRAVENOUS
  Filled 2014-01-01: qty 1

## 2014-01-01 MED ORDER — TRAMADOL HCL 50 MG PO TABS: 50.0000 mg | ORAL_TABLET | Freq: Four times a day (QID) | ORAL | Status: DC | PRN

## 2014-01-01 NOTE — ED Provider Notes (Signed)
11:51 AM  Date: 01/01/2014  Rate: 56  Rhythm: sinus bradycardia  QRS Axis: normal  Intervals: normal  ST/T Wave abnormalities: nonspecific ST/T changes  Conduction Disutrbances:none  Narrative Interpretation:   Old EKG Reviewed: unchanged from prior   Threasa Beards, MD 01/01/14 1151
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
## 2014-01-01 NOTE — ED Notes (Signed)
Pt arrrives via POV from home with 2 day hx of nausea, vomiting and diarrhea. Pt denies pain. States nausea is his primary complaint. Denies fever. Lives alone. Pt awake, alert, oriented x4, VSS.

## 2014-01-01 NOTE — Discharge Instructions (Signed)
Take zofran as needed for nausea. Take tramadol as needed for pain. Return to the ED in 2 days if symptoms persist or worsen. Follow up with your doctor. Refer to attached documents for more information.

## 2014-01-02 NOTE — ED Provider Notes (Signed)
Medical screening examination/treatment/procedure(s) were conducted as a shared visit with non-physician practitioner(s) and myself.  I personally evaluated the patient during the encounter.   EKG Interpretation None     Pt seen and evaluated.  Pt awake and alert, nad.  Abdomen nontender on exam.  He is feeling improved after IV fluids and antiemetics, appears well hydrated, he is tolerating po fluids in the ED prior to discharge.  Suspect viral illness.  EKG reassuring and doubt ACS or acute intraabdominal pathology.  Discharged with strict return precautions.  Pt agreeable with plan.  Threasa Beards, MD 01/02/14 [phone]

## 2014-01-04 ENCOUNTER — Ambulatory Visit (INDEPENDENT_AMBULATORY_CARE_PROVIDER_SITE_OTHER): Payer: Medicare Other | Admitting: Family Medicine

## 2014-01-04 ENCOUNTER — Encounter: Payer: Self-pay | Admitting: Family Medicine

## 2014-01-04 VITALS — BP 110/62 | HR 70 | Temp 98.1°F | Ht 72.0 in | Wt 198.4 lb

## 2014-01-04 DIAGNOSIS — Z23 Encounter for immunization: Secondary | ICD-10-CM

## 2014-01-04 DIAGNOSIS — K529 Noninfective gastroenteritis and colitis, unspecified: Secondary | ICD-10-CM

## 2014-01-04 DIAGNOSIS — R300 Dysuria: Secondary | ICD-10-CM

## 2014-01-04 DIAGNOSIS — R399 Unspecified symptoms and signs involving the genitourinary system: Secondary | ICD-10-CM

## 2014-01-04 LAB — POCT URINALYSIS DIPSTICK
BILIRUBIN UA: NEGATIVE
Glucose, UA: NEGATIVE
Ketones, UA: NEGATIVE
NITRITE UA: NEGATIVE
PH UA: 6
Spec Grav, UA: >=1.03
Urobilinogen, UA: NEGATIVE

## 2014-01-04 MED ORDER — CIPROFLOXACIN HCL 500 MG PO TABS: 500.0000 mg | ORAL_TABLET | Freq: Two times a day (BID) | ORAL | Status: DC

## 2014-01-04 NOTE — Patient Instructions (Signed)
We'll contact you with your lab report. Clear liquid diet for now.  Drink plenty of fluids.   Take care.  Start cipro twice a day.

## 2014-01-04 NOTE — Progress Notes (Signed)
Pre visit review using our clinic review tool, if applicable. No additional management support is needed unless otherwise documented below in the visit note.  Seen at ER.  Sx started 12/30/13.  Vomiting, diarrhea, abd pain. Seen at ER.  Likely AGE.  No fevers.  Didn't tolerate zofran.  Still with nausea.  No diarrhea, no vomiting.     Since then with dysuria. Didn't tolerate zofran.  Now with burning with self cath, having to do it twice a day.  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  Prev uro note reviewed.   Meds, vitals, and allergies reviewed.   ROS: See HPI.  Otherwise, noncontributory.  nad ncat Mmm Neck supple, no LA rrr ctab Abd soft but suprapubic area ttp w/o rebound Skin well perfused.  No cva pain

## 2014-01-06 DIAGNOSIS — K529 Noninfective gastroenteritis and colitis, unspecified: Secondary | ICD-10-CM | POA: Insufficient documentation

## 2014-01-06 NOTE — Assessment & Plan Note (Signed)
Resolving, would avoid zofran in the future.

## 2014-01-06 NOTE — Assessment & Plan Note (Signed)
Nontoxic, start cipro at this point, see notes on labs.  >25 minutes spent in face to face time with patient, >50% spent in counselling or coordination of care.

## 2014-01-07 LAB — URINE CULTURE: Colony Count: >=100000

## 2014-01-08 ENCOUNTER — Other Ambulatory Visit: Payer: Self-pay | Admitting: Family Medicine

## 2014-01-08 MED ORDER — CEPHALEXIN 250 MG PO CAPS: 250.0000 mg | ORAL_CAPSULE | Freq: Three times a day (TID) | ORAL | Status: DC

## 2014-01-30 ENCOUNTER — Telehealth: Payer: Self-pay

## 2014-01-30 NOTE — Telephone Encounter (Signed)
Needs eval either here or UC, likely needs urine recultured before starting abx.

## 2014-01-30 NOTE — Telephone Encounter (Signed)
Pt left v/m pt was seen 01/04/14 with UTI; now burns upon urination. Pt said when finished antibiotics symptoms were cleared. Burning upon urination and hurts in lower abd, also having frequency of urine which started this morning. No fever and no back pain. Pt wants to know if has to schedule appt or can med be sent to [facility]. Pt request cb.

## 2014-01-30 NOTE — Telephone Encounter (Signed)
Thanks

## 2014-01-30 NOTE — Telephone Encounter (Signed)
Patient notified as instructed by telephone. No appointments available today. Appointment scheduled with Webb Silversmith NP 01/31/14. Advised patient if symptoms get worse prior to his appointment to go to an urgent care for treatment. Patient agreed.

## 2014-01-31 ENCOUNTER — Encounter: Payer: Self-pay | Admitting: Internal Medicine

## 2014-01-31 ENCOUNTER — Ambulatory Visit (INDEPENDENT_AMBULATORY_CARE_PROVIDER_SITE_OTHER): Payer: Medicare Other | Admitting: Internal Medicine

## 2014-01-31 VITALS — BP 108/80 | HR 68 | Temp 97.8°F | Wt 195.0 lb

## 2014-01-31 DIAGNOSIS — R300 Dysuria: Secondary | ICD-10-CM

## 2014-01-31 DIAGNOSIS — N390 Urinary tract infection, site not specified: Secondary | ICD-10-CM

## 2014-01-31 LAB — POCT URINALYSIS DIPSTICK
Bilirubin, UA: NEGATIVE
Glucose, UA: NEGATIVE
Ketones, UA: NEGATIVE
NITRITE UA: POSITIVE
Spec Grav, UA: 1.025
UROBILINOGEN UA: NEGATIVE
pH, UA: 6

## 2014-01-31 MED ORDER — CIPROFLOXACIN HCL 500 MG PO TABS: 500.0000 mg | ORAL_TABLET | Freq: Two times a day (BID) | ORAL | Status: DC
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
## 2014-01-31 NOTE — Patient Instructions (Addendum)

## 2014-01-31 NOTE — Progress Notes (Signed)
Pre visit review using our clinic review tool, if applicable. No additional management support is needed unless otherwise documented below in the visit note. 
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
## 2014-02-02 LAB — URINE CULTURE
Colony Count: NO GROWTH
Organism ID, Bacteria: NO GROWTH

## 2014-02-04 ENCOUNTER — Ambulatory Visit (INDEPENDENT_AMBULATORY_CARE_PROVIDER_SITE_OTHER): Payer: Medicare Other | Admitting: Family Medicine

## 2014-02-04 ENCOUNTER — Encounter: Payer: Self-pay | Admitting: Family Medicine

## 2014-02-04 VITALS — BP 134/82 | HR 60 | Temp 98.5°F | Wt 196.8 lb

## 2014-02-04 DIAGNOSIS — R399 Unspecified symptoms and signs involving the genitourinary system: Secondary | ICD-10-CM

## 2014-02-04 NOTE — Patient Instructions (Signed)
Keep taking the antibiotics and I'll check with [facility] Endo' clinic in the meantime.  Take care.  Glad to see you.

## 2014-02-04 NOTE — Assessment & Plan Note (Signed)
With likely UTI recently improved, but with blood in urine.  I want opinion from [facility] about what can be done at this point.  Would continue with cath BID and abx as is.  D/w pt.  He is some better.  Wouldn't stop anticoagulation, since the blood in urine is better recently per patient report. DVT hx noted.  D/w pt.  He'll call about f/u with uro.  App help of all involved.

## 2014-02-04 NOTE — Progress Notes (Signed)
Pre visit review using our clinic review tool, if applicable. No additional management support is needed unless otherwise documented below in the visit note.  Prev with dysuria, white discharge from urethra.  Frequency.  Dip was pos, ucx neg.  Started on cipro. Sx improved, no more discharge.   No with less pain but with blood in urine for last 48 hours, better today.  Having to cath BID at baseline.  Still on cipro.  Still on anticoagulation,needs to remain so.   Some discomfort with cath just before the tip of cath enters the bladder.    No fevers. Seeing Tresa Endo with uro.    Meds, vitals, and allergies reviewed.   ROS: See HPI.  Otherwise, noncontributory.  GEN: nad, alert and oriented HEENT: mucous membranes moist NECK: supple w/o LA CV: rrr. PULM: ctab, no inc wob ABD: soft, +bs, not ttp EXT: no edema

## 2014-02-13 ENCOUNTER — Ambulatory Visit (INDEPENDENT_AMBULATORY_CARE_PROVIDER_SITE_OTHER): Payer: Medicare Other | Admitting: Family Medicine

## 2014-02-13 ENCOUNTER — Encounter: Payer: Self-pay | Admitting: Family Medicine

## 2014-02-13 VITALS — BP 126/78 | HR 72 | Temp 99.1°F | Wt 194.2 lb

## 2014-02-13 DIAGNOSIS — R300 Dysuria: Secondary | ICD-10-CM

## 2014-02-13 DIAGNOSIS — I82402 Acute embolism and thrombosis of unspecified deep veins of left lower extremity: Secondary | ICD-10-CM

## 2014-02-13 DIAGNOSIS — R399 Unspecified symptoms and signs involving the genitourinary system: Secondary | ICD-10-CM

## 2014-02-13 DIAGNOSIS — N390 Urinary tract infection, site not specified: Secondary | ICD-10-CM

## 2014-02-13 LAB — POCT URINALYSIS DIPSTICK
Glucose, UA: NEGATIVE
Ketones, UA: NEGATIVE
NITRITE UA: NEGATIVE
SPEC GRAV UA: 1.02
Urobilinogen, UA: 0.2
pH, UA: 6

## 2014-02-13 MED ORDER — CIPROFLOXACIN HCL 500 MG PO TABS: 500.0000 mg | ORAL_TABLET | Freq: Two times a day (BID) | ORAL | Status: DC

## 2014-02-13 NOTE — Progress Notes (Signed)
Pre visit review using our clinic review tool, if applicable. No additional management support is needed unless otherwise documented below in the visit note.  abd pain/sx started last night.  Still having to do BID urinary cath.  Seeing Dr. Tresa Endo with uro.   He improved with cipro but then as soon as he stopped he got more burning and pain.  This is same as it was last time.   No fevers.  Milky urinary discharge.   Suprapubic abd pain, no upper abd pain.  No vomiting.  Last took cipro dose was a few days ago.   L leg DVT with edema resolved. Approaching 3 months of anticoagulation.   FH DVT noted, brother was on coumadin prev.  D/w pt.    Meds, vitals, and allergies reviewed.   ROS: See HPI.  Otherwise, noncontributory.  nad ncat Mmm rrr ctab abd soft, suprapubic area ttp Testes bilaterally descended without nodularity, tenderness or masses. No scrotal masses or lesions. No penis lesions or urethral discharge. Legs w/o calf tenderness or BLE edema

## 2014-02-13 NOTE — Assessment & Plan Note (Signed)
Check u/s.  If pos, continue anticoagulation.  If neg, then get hypercoag w/u. If neg, then consider stopping anticoagulation.  If pos, likely extended anticoagulation.  D/w pt.  He agrees.  >25 minutes spent in face to face time with patient, >50% spent in counselling or coordination of care.

## 2014-02-13 NOTE — Patient Instructions (Signed)
Drink plenty of water and start the antibiotics today.  We'll contact you with your lab report.   Rosaria Ferries or Vaughan Basta will call about your referral for the doppler. If that is okay and no clot is seen then we'll set up the follow up blood test.  Don't stop the xarelto until we talk about it. I'll check with Rosana Hoes in the meantime.   Take care.

## 2014-02-13 NOTE — Assessment & Plan Note (Signed)
Check ucx, restart cipro, will ask for uro input.

## 2014-02-14 LAB — URINE CULTURE
Colony Count: NO GROWTH
Organism ID, Bacteria: NO GROWTH

## 2014-02-19 ENCOUNTER — Other Ambulatory Visit (HOSPITAL_COMMUNITY): Payer: Self-pay | Admitting: Cardiology

## 2014-02-19 ENCOUNTER — Ambulatory Visit (INDEPENDENT_AMBULATORY_CARE_PROVIDER_SITE_OTHER): Payer: Medicare Other | Admitting: Family Medicine

## 2014-02-19 DIAGNOSIS — I82409 Acute embolism and thrombosis of unspecified deep veins of unspecified lower extremity: Secondary | ICD-10-CM

## 2014-02-20 ENCOUNTER — Encounter: Payer: Self-pay | Admitting: Family Medicine

## 2014-02-20 ENCOUNTER — Ambulatory Visit (HOSPITAL_COMMUNITY): Payer: Medicare Other | Attending: Family Medicine | Admitting: Cardiology

## 2014-02-20 ENCOUNTER — Ambulatory Visit (INDEPENDENT_AMBULATORY_CARE_PROVIDER_SITE_OTHER): Payer: Medicare Other | Admitting: Family Medicine

## 2014-02-20 VITALS — BP 108/68 | HR 62 | Temp 98.4°F | Wt 191.2 lb

## 2014-02-20 DIAGNOSIS — I82409 Acute embolism and thrombosis of unspecified deep veins of unspecified lower extremity: Secondary | ICD-10-CM | POA: Diagnosis present

## 2014-02-20 DIAGNOSIS — I82402 Acute embolism and thrombosis of unspecified deep veins of left lower extremity: Secondary | ICD-10-CM

## 2014-02-20 DIAGNOSIS — R399 Unspecified symptoms and signs involving the genitourinary system: Secondary | ICD-10-CM

## 2014-02-20 DIAGNOSIS — N390 Urinary tract infection, site not specified: Secondary | ICD-10-CM

## 2014-02-20 DIAGNOSIS — I80202 Phlebitis and thrombophlebitis of unspecified deep vessels of left lower extremity: Secondary | ICD-10-CM

## 2014-02-20 DIAGNOSIS — I10 Essential (primary) hypertension: Secondary | ICD-10-CM | POA: Diagnosis not present

## 2014-02-20 DIAGNOSIS — E785 Hyperlipidemia, unspecified: Secondary | ICD-10-CM | POA: Insufficient documentation

## 2014-02-20 MED ORDER — NITROFURANTOIN MACROCRYSTAL 50 MG PO CAPS: 50.0000 mg | ORAL_CAPSULE | Freq: Every day | ORAL | Status: DC

## 2014-02-20 MED ORDER — CIPROFLOXACIN HCL 500 MG PO TABS: 500.0000 mg | ORAL_TABLET | Freq: Two times a day (BID) | ORAL | Status: DC

## 2014-02-20 NOTE — Progress Notes (Signed)
Left lower extremity venous duplex performed  

## 2014-02-20 NOTE — Progress Notes (Signed)
Pre visit review using our clinic review tool, if applicable. No additional management support is needed unless otherwise documented below in the visit note.  His white penile discharge is better, while on cipro, but he had blood in his urine for 3 days recently, better now.  He has a few doses cipro left.   His urinary pain is resolved now.   We talked about macrobid suppression.  I had talked with uro prev about options.   DVT f/u.  He had his u/s today, I'm awaiting the final report.  D/w pt re: plan.   Meds, vitals, and allergies reviewed.   ROS: See HPI.  Otherwise, noncontributory.  nad ncat Mmm rrr ctab abd soft, not ttp

## 2014-02-20 NOTE — Patient Instructions (Signed)
Finish your current cipro pills then change to macrodantin/notrifurantoin 50mg  a day.  If you have more symptoms, especially burning with urination, then restart the cipro and notify me.  I'll await the final report on the ultrasound before we make plans about the follow up lab test.  Take care.

## 2014-02-21 NOTE — Assessment & Plan Note (Signed)
Await final report on u/s.  Will see if he is at the point of ordering a hypercoag panel.  We'll need to make plans at that point.  D/w pt.  He agrees.  Leg edema resolved.

## 2014-02-21 NOTE — Assessment & Plan Note (Signed)
We talked about macrobid suppression.  I had talked with uro prev about options.  We'll have to address the DVT/anticoagulation issue before he has/considers definitive treatment.   He'll finish cipro, change to macobid, and then restart cipro if sx return.  He'll hold cipro rx in meantime.  Will notify me if the sx return.  He agrees.

## 2014-02-26 ENCOUNTER — Other Ambulatory Visit: Payer: Self-pay | Admitting: Family Medicine

## 2014-02-26 MED ORDER — RIVAROXABAN 20 MG PO TABS: 20.0000 mg | ORAL_TABLET | Freq: Every day | ORAL | Status: DC

## 2014-03-26 ENCOUNTER — Other Ambulatory Visit: Payer: Self-pay | Admitting: Family Medicine

## 2014-04-05 ENCOUNTER — Other Ambulatory Visit: Payer: Self-pay | Admitting: Family Medicine

## 2014-04-08 NOTE — Telephone Encounter (Signed)
Sent, schedule CPE for summer 2016.  Thanks.  

## 2014-04-08 NOTE — Telephone Encounter (Signed)
Electronic refill request. Last Filled:    90 tablet 3 RF on 04/23/2013  ? If patient needs OV.  Please advise.

## 2014-04-09 NOTE — Telephone Encounter (Signed)
Patient advised and transferred for CPE scheduling.

## 2014-04-30 ENCOUNTER — Encounter: Payer: Self-pay | Admitting: Family Medicine

## 2014-04-30 ENCOUNTER — Ambulatory Visit (INDEPENDENT_AMBULATORY_CARE_PROVIDER_SITE_OTHER): Payer: Medicare Other | Admitting: Family Medicine

## 2014-04-30 VITALS — BP 102/70 | HR 66 | Temp 97.6°F | Ht 72.0 in | Wt 195.0 lb

## 2014-04-30 DIAGNOSIS — G500 Trigeminal neuralgia: Secondary | ICD-10-CM

## 2014-04-30 DIAGNOSIS — R399 Unspecified symptoms and signs involving the genitourinary system: Secondary | ICD-10-CM

## 2014-04-30 DIAGNOSIS — I10 Essential (primary) hypertension: Secondary | ICD-10-CM

## 2014-04-30 DIAGNOSIS — I82402 Acute embolism and thrombosis of unspecified deep veins of left lower extremity: Secondary | ICD-10-CM

## 2014-04-30 DIAGNOSIS — Z125 Encounter for screening for malignant neoplasm of prostate: Secondary | ICD-10-CM

## 2014-04-30 DIAGNOSIS — Z0000 Encounter for general adult medical examination without abnormal findings: Secondary | ICD-10-CM

## 2014-04-30 LAB — COMPREHENSIVE METABOLIC PANEL
ALBUMIN: 4 g/dL (ref 3.5–5.2)
ALK PHOS: 47 U/L (ref 39–117)
ALT: 13 U/L (ref 0–53)
AST: 16 U/L (ref 0–37)
BUN: 20 mg/dL (ref 6–23)
CO2: 28 meq/L (ref 19–32)
Calcium: 9.2 mg/dL (ref 8.4–10.5)
Chloride: 108 mEq/L (ref 96–112)
Creatinine, Ser: 0.99 mg/dL (ref 0.40–1.50)
GFR: 80.15 mL/min (ref >60.00)
Glucose, Bld: 100 mg/dL — ABNORMAL HIGH (ref 70–99)
Potassium: 4.5 mEq/L (ref 3.5–5.1)
Sodium: 140 mEq/L (ref 135–145)
Total Bilirubin: 0.6 mg/dL (ref 0.2–1.2)
Total Protein: 6.1 g/dL (ref 6.0–8.3)

## 2014-04-30 LAB — PSA: PSA: 1.32 ng/mL (ref 0.10–4.00)

## 2014-04-30 LAB — LIPID PANEL
CHOL/HDL RATIO: 3
CHOLESTEROL: 183 mg/dL (ref 0–200)
HDL: 57.2 mg/dL (ref >39.00)
LDL CALC: 112 mg/dL — AB (ref 0–99)
NONHDL: 125.8
TRIGLYCERIDES: 69 mg/dL (ref 0.0–149.0)
VLDL: 13.8 mg/dL (ref 0.0–40.0)

## 2014-04-30 MED ORDER — NITROFURANTOIN MACROCRYSTAL 50 MG PO CAPS: 50.0000 mg | ORAL_CAPSULE | Freq: Every day | ORAL | Status: DC

## 2014-04-30 MED ORDER — FINASTERIDE 5 MG PO TABS: ORAL_TABLET | ORAL | Status: DC

## 2014-04-30 MED ORDER — TAMSULOSIN HCL 0.4 MG PO CAPS: 0.4000 mg | ORAL_CAPSULE | Freq: Every day | ORAL | Status: DC

## 2014-04-30 NOTE — Assessment & Plan Note (Signed)
When anticoagulation issues resolved, will also need referal for trig neuralgia at that point, since his pain continues.   Some better with lyrica, no ADE, but pain not resolved. L sided facial pain.

## 2014-04-30 NOTE — Patient Instructions (Addendum)
Check with your insurance to see if they will cover the shingles shot. Go to the lab on the way out.  We'll contact you with your lab report. Rosaria Ferries will call about your referral- ultrasound due mid February.  You'll likely need another lab test after the ultrasound is done.   We'll set up the referral for the trigeminal neuralgia and we'll make plans about urology after the ultrasound and follow up lab tests are done.  Take care.  Glad to see you.  Don't change your meds for now. I sent your flomax, finasteride, and macrobid to the pharmacy.  Check to see if you need a refill on lyrica.

## 2014-04-30 NOTE — Progress Notes (Signed)
Pre visit review using our clinic review tool, if applicable. No additional management support is needed unless otherwise documented below in the visit note.  I have personally reviewed the Medicare Annual Wellness questionnaire and have noted 1. The patient's medical and social history 2. Their use of alcohol, tobacco or illicit drugs 3. Their current medications and supplements 4. The patient's functional ability including ADL's, fall risks, home safety risks and hearing or visual             impairment. 5. Diet and physical activities 6. Evidence for depression or mood disorders  The patients weight, height, BMI have been recorded in the chart and visual acuity is per eye clinic.  I have made referrals, counseling and provided education to the patient based review of the above and I have provided the pt with a written personalized care plan for preventive services.  Provider list updated- see scanned forms.  Routine anticipatory guidance given to patient.  See health maintenance.  Flu 2015 Shingles d/w pt.   PNA prev done Tetanus 2014 Colonoscopy 2014 Prostate cancer screening- d/w pt.  PSA pending.  Advance directive- daughter designated if patient were incapacitated.  Cognitive function addressed- see scanned forms- and if abnormal then additional documentation follows.   Hypertension:    Using medication without problems or lightheadedness: yes Chest pain with exertion:no Edema:no Short of breath:no F/u labs pending.  L leg DVT.  Still on xarelto, some bruising but no alarming bleeding.  D/w pt.  Will be due for f/u u/s in mid 05/2014.  Will likely need hypercoag panel after that.    When anticoagulation issues resolved, will then likely have f/u with urology at [facility].  Still with some dysuria, but much improved and no more sx of active infection- prev with pus noted per cath but not since starting macrobid suppression.  Has known urinary retention and still needs BID self cath.   All d/w pt.   Will also need referal for trig neuralgia at that point, since his pain continues.  Some better with lyrica, no ADE, but pain not resolved. L sided facial pain.   PMH and SH reviewed  Meds, vitals, and allergies reviewed.   ROS: See HPI.  Otherwise negative.    GEN: nad, alert and oriented HEENT: mucous membranes moist, tm wnl, OP wnl NECK: supple w/o LA CV: rrr. PULM: ctab, no inc wob ABD: soft, +bs EXT: no edema, L leg not puffy SKIN: no acute rash

## 2014-04-30 NOTE — Addendum Note (Signed)
Addended by: Marchia Bond on: 04/30/2014 02:45 PM   Modules accepted: Miquel Dunn

## 2014-04-30 NOTE — Assessment & Plan Note (Signed)
When anticoagulation issues resolved, will then likely have f/u with urology at [facility].  Still with some dysuria, but much improved and no more sx of active infection- prev with pus noted per cath but not since starting macrobid suppression.  Has known urinary retention and still needs BID self cath.  All d/w pt.

## 2014-04-30 NOTE — Assessment & Plan Note (Signed)
L leg DVT.  Still on xarelto, some bruising but no alarming bleeding.  D/w pt.  Will be due for f/u u/s in mid 05/2014.  Will likely need hypercoag panel after that.  Continue xarelto for now.  He agrees.   Other issues, to be done after anticoagulation issues resolved-  will then likely have f/u with urology at [facility].  Still with some dysuria, but much improved and no more sx of active infection- prev with pus noted per cath but not since starting macrobid suppression.  Has known urinary retention and still needs BID self cath.  All d/w pt.   Will also need referal for trig neuralgia at that point, since his pain continues.  Some better with lyrica, no ADE, but pain not resolved. L sided facial pain.

## 2014-04-30 NOTE — Assessment & Plan Note (Signed)
Flu 2015  Shingles d/w pt.  PNA prev done  Tetanus 2014  Colonoscopy 2014  Prostate cancer screening- d/w pt. PSA pending.  Advance directive- daughter designated if patient were incapacitated.  Cognitive function addressed- see scanned forms- and if abnormal then additional documentation follows.  Diet and exercise d/w pt.

## 2014-05-09 ENCOUNTER — Other Ambulatory Visit: Payer: Self-pay | Admitting: Family Medicine

## 2014-05-09 NOTE — Telephone Encounter (Signed)
Please call in.  Thanks.   

## 2014-05-09 NOTE — Telephone Encounter (Signed)
Electronic refill request.   Historical med ? Marland Kitchen  Please advise.

## 2014-05-10 NOTE — Telephone Encounter (Signed)
Pt left v/m requesting status of lyrica refill; pt request cb.

## 2014-05-10 NOTE — Telephone Encounter (Signed)
Medication phoned to pharmacy.  

## 2014-05-22 ENCOUNTER — Ambulatory Visit (HOSPITAL_COMMUNITY): Payer: Medicare Other | Attending: Cardiology | Admitting: *Deleted

## 2014-05-22 ENCOUNTER — Other Ambulatory Visit (HOSPITAL_COMMUNITY): Payer: Self-pay | Admitting: *Deleted

## 2014-05-22 DIAGNOSIS — I82402 Acute embolism and thrombosis of unspecified deep veins of left lower extremity: Secondary | ICD-10-CM | POA: Diagnosis present

## 2014-05-22 DIAGNOSIS — I82409 Acute embolism and thrombosis of unspecified deep veins of unspecified lower extremity: Secondary | ICD-10-CM

## 2014-05-22 NOTE — Progress Notes (Signed)
Unilateral Lower Extremity Venous Duplex Exam - Left - Negative for acute or chronic thrombus.

## 2014-05-26 ENCOUNTER — Other Ambulatory Visit: Payer: Self-pay | Admitting: Family Medicine

## 2014-05-26 DIAGNOSIS — I82402 Acute embolism and thrombosis of unspecified deep veins of left lower extremity: Secondary | ICD-10-CM

## 2014-05-27 ENCOUNTER — Other Ambulatory Visit (INDEPENDENT_AMBULATORY_CARE_PROVIDER_SITE_OTHER): Payer: Medicare Other

## 2014-05-27 DIAGNOSIS — I82402 Acute embolism and thrombosis of unspecified deep veins of left lower extremity: Secondary | ICD-10-CM

## 2014-05-30 LAB — HYPERCOAGULABLE PANEL, COMPREHENSIVE
AntiThromb III Func: 86 % (ref 76–126)
Anticardiolipin IgA: 0 APL U/mL (ref <22)
Anticardiolipin IgG: 2 GPL U/mL (ref <23)
Anticardiolipin IgM: 0 MPL U/mL (ref <11)
BETA 2 GLYCO I IGG: 11 G Units (ref <20)
Beta-2-Glycoprotein I IgA: 5 A Units (ref <20)
Beta-2-Glycoprotein I IgM: 4 M Units (ref <20)
DRVVT 1 1 MIX: 44.7 s — AB (ref <42.9)
DRVVT CONFIRMATION: 1.12 ratio (ref <1.15)
DRVVT: 58.9 s — AB (ref <42.9)
Lupus Anticoagulant: NOT DETECTED
PROTEIN C ACTIVITY: 150 % — AB (ref 75–133)
PTT Lupus Anticoagulant: 49.6 secs — ABNORMAL HIGH (ref 28.0–43.0)
PTTLA 4:1 Mix: 48.1 secs — ABNORMAL HIGH (ref 28.0–43.0)
PTTLA Confirmation: 0 secs (ref <8.0)
Protein C, Total: 83 % (ref 72–160)
Protein S Activity: 112 % (ref 69–129)
Protein S Total: 65 % (ref 60–150)

## 2014-06-02 ENCOUNTER — Other Ambulatory Visit: Payer: Self-pay | Admitting: Family Medicine

## 2014-06-02 ENCOUNTER — Telehealth: Payer: Self-pay | Admitting: Family Medicine

## 2014-06-02 DIAGNOSIS — D6851 Activated protein C resistance: Secondary | ICD-10-CM | POA: Insufficient documentation

## 2014-06-02 NOTE — Telephone Encounter (Signed)
Please send this letter to Dr. Tresa Endo at [facility].  Thanks.

## 2014-06-03 ENCOUNTER — Encounter: Payer: Self-pay | Admitting: Family Medicine

## 2014-06-03 ENCOUNTER — Ambulatory Visit (INDEPENDENT_AMBULATORY_CARE_PROVIDER_SITE_OTHER): Payer: Medicare Other | Admitting: Family Medicine

## 2014-06-03 VITALS — BP 104/58 | HR 68 | Temp 98.4°F | Wt 195.0 lb

## 2014-06-03 DIAGNOSIS — D6851 Activated protein C resistance: Secondary | ICD-10-CM

## 2014-06-03 DIAGNOSIS — N390 Urinary tract infection, site not specified: Secondary | ICD-10-CM

## 2014-06-03 DIAGNOSIS — R319 Hematuria, unspecified: Secondary | ICD-10-CM

## 2014-06-03 DIAGNOSIS — R399 Unspecified symptoms and signs involving the genitourinary system: Secondary | ICD-10-CM

## 2014-06-03 DIAGNOSIS — D688 Other specified coagulation defects: Secondary | ICD-10-CM

## 2014-06-03 LAB — POCT URINALYSIS DIPSTICK
Bilirubin, UA: NEGATIVE
GLUCOSE UA: NEGATIVE
KETONES UA: NEGATIVE
Nitrite, UA: NEGATIVE
PH UA: 6
Spec Grav, UA: >=1.03
Urobilinogen, UA: 4

## 2014-06-03 MED ORDER — CIPROFLOXACIN HCL 500 MG PO TABS: 500.0000 mg | ORAL_TABLET | Freq: Two times a day (BID) | ORAL | Status: DC

## 2014-06-03 NOTE — Assessment & Plan Note (Addendum)
Improved now, no sign of pyelo, will finish current course of cipro, then change back to macrobid.  Hold extra cipro rx for now.  No need to culture today, given abx use.  Will get patient to uro after hematology appointment.

## 2014-06-03 NOTE — Progress Notes (Signed)
Pre visit review using our clinic review tool, if applicable. No additional management support is needed unless otherwise documented below in the visit note.  More L trigeminal neuralgia pain recently.    Recently with fever, dysuria.  Was on suppressive tx for UTI, then changed to cipro in the meantime.  He had been passing some clots and blood.  No fevers now.   He has been passing blood at baseline on xarelto with urination, no blood in stool.  He is improved from dysuria standpoint, since starting cipro.  I printed another rx today for him to hold.    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  See plan.   Meds, vitals, and allergies reviewed.   ROS: See HPI.  Otherwise, noncontributory.  nad ncat Mmm rrr ctab abd soft, not ttp Ext w/o edema

## 2014-06-03 NOTE — Telephone Encounter (Signed)
Faxed

## 2014-06-03 NOTE — Assessment & Plan Note (Signed)
>  25 minutes spent in face to face time with patient, >50% spent in counselling or coordination of care.  D/w pt in detail, will ask for hematology input on duration of anticoagulation.  Will hold xarelto for 5 days given the hematuria, restart if no more bleeding.   After hematology appointment, we can make plans with uro and neuro for his other conditions.  He agrees.

## 2014-06-03 NOTE — Telephone Encounter (Signed)
Received refill request electronically from pharmacy. Last refill 02/26/14 #30/2 refills, last office visit 06/03/14. Is it okay to refill medication?

## 2014-06-03 NOTE — Patient Instructions (Signed)
Go to the lab on the way out.  We'll contact you with your lab report. Rosaria Ferries will call about your referral- see her on the way out about going to the hematology clinic.  Stop the xarelto for the next 5 days then restart it if you are not passing blood.   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  Take care.  Glad to see you.

## 2014-06-03 NOTE — Telephone Encounter (Signed)
Sent, notified patient at [place].  Thanks.

## 2014-06-04 LAB — CBC WITH DIFFERENTIAL/PLATELET
BASOS PCT: 0.6 % (ref 0.0–3.0)
Basophils Absolute: 0 10*3/uL (ref 0.0–0.1)
Eosinophils Absolute: 0.3 10*3/uL (ref 0.0–0.7)
Eosinophils Relative: 4.2 % (ref 0.0–5.0)
HCT: 35.3 % — ABNORMAL LOW (ref 39.0–52.0)
Hemoglobin: 11.9 g/dL — ABNORMAL LOW (ref 13.0–17.0)
LYMPHS ABS: 1.1 10*3/uL (ref 0.7–4.0)
Lymphocytes Relative: 16.8 % (ref 12.0–46.0)
MCHC: 33.7 g/dL (ref 30.0–36.0)
MCV: 83.7 fl (ref 78.0–100.0)
MONO ABS: 0.5 10*3/uL (ref 0.1–1.0)
Monocytes Relative: 8 % (ref 3.0–12.0)
NEUTROS PCT: 70.4 % (ref 43.0–77.0)
Neutro Abs: 4.4 10*3/uL (ref 1.4–7.7)
PLATELETS: 220 10*3/uL (ref 150.0–400.0)
RBC: 4.23 Mil/uL (ref 4.22–5.81)
RDW: 12.8 % (ref 11.5–15.5)
WBC: 6.2 10*3/uL (ref 4.0–10.5)

## 2014-06-10 ENCOUNTER — Ambulatory Visit
Admit: 2014-06-10 | Disposition: A | Discharge status: Home / Self Care | Payer: Self-pay | Attending: Hematology and Oncology | Admitting: Hematology and Oncology

## 2014-06-24 ENCOUNTER — Encounter: Payer: Self-pay | Admitting: Family Medicine

## 2014-06-24 ENCOUNTER — Ambulatory Visit (INDEPENDENT_AMBULATORY_CARE_PROVIDER_SITE_OTHER): Payer: Medicare Other | Admitting: Family Medicine

## 2014-06-24 VITALS — BP 92/58 | HR 58 | Temp 98.5°F | Wt 192.0 lb

## 2014-06-24 DIAGNOSIS — I82402 Acute embolism and thrombosis of unspecified deep veins of left lower extremity: Secondary | ICD-10-CM

## 2014-06-24 DIAGNOSIS — K602 Anal fissure, unspecified: Secondary | ICD-10-CM

## 2014-06-24 DIAGNOSIS — K629 Disease of anus and rectum, unspecified: Secondary | ICD-10-CM

## 2014-06-24 NOTE — Patient Instructions (Addendum)
Nitroglycerin 0.4% cream.  Apply to affected area up to 3 times a day.  30g, 1 rf.  It may give you a headache but it should help.  Pharmacy: [facility], [place] [phone] Pick it up at 5:30.  They close at 6 PM.  Take care.  Glad to see you.

## 2014-06-24 NOTE — Progress Notes (Signed)
Pre visit review using our clinic review tool, if applicable. No additional management support is needed unless otherwise documented below in the visit note.  Had seen hematology, d/w pt.  He has some f/u labs pending.  Assuming no other new information to change the plan, he'll likely be able to stop xarelto soon.  We talked about proceeding with uro eval and he'll be in contact with [facility] about that.    Pain with BM.  Hard stools, having to strain, was constipated.  Now on frequent stool softeners and metamucil.  Still with hard stools than normal, but softer than previous.  Blood with wiping.  Pain is sharp, at the rectum, with BM.    Meds, vitals, and allergies reviewed.   ROS: See HPI.  Otherwise, noncontributory.  nad Ext rectal exam w/o gross blood or hemorrhoid, ttp at the sphincter.

## 2014-06-25 DIAGNOSIS — K602 Anal fissure, unspecified: Secondary | ICD-10-CM | POA: Insufficient documentation

## 2014-06-25 NOTE — Assessment & Plan Note (Signed)
Likely.  Continue stool softeners. Called in Nitroglycerin 0.4% cream. Apply to affected area up to 3 times a day. 30g, 1 rf. See AVS.  Pharmacy: [facility] [place], [place] [phone].

## 2014-06-25 NOTE — Assessment & Plan Note (Signed)
He has some f/u labs pending. Assuming no other new information to change the plan, he'll likely be able to stop xarelto soon. We talked about proceeding with uro eval and he'll be in contact with [facility] about that.

## 2014-06-27 ENCOUNTER — Telehealth: Payer: Self-pay

## 2014-06-27 DIAGNOSIS — D6851 Activated protein C resistance: Secondary | ICD-10-CM

## 2014-06-27 DIAGNOSIS — I82402 Acute embolism and thrombosis of unspecified deep veins of left lower extremity: Secondary | ICD-10-CM

## 2014-06-27 NOTE — Telephone Encounter (Signed)
I am awaiting the last lab results from hematology.  I'll know more when I get that.  I haven't forgotten this- I'm waiting to hear.  Would continue for now.  Thanks.

## 2014-06-27 NOTE — Telephone Encounter (Signed)
Pt was seen 06/24/14 and pt wants to know if he will be able to stop the Xarelto and if so when can pt stop med. Pt request cb.

## 2014-06-27 NOTE — Telephone Encounter (Signed)
Spoke to patient, he did go to [facility] to the cancer center and saw a male provider. He had blood work done.

## 2014-06-30 ENCOUNTER — Other Ambulatory Visit: Payer: Self-pay | Admitting: Family Medicine

## 2014-07-01 NOTE — Assessment & Plan Note (Signed)
S/p 6 months of anticoagulation as of 06/2014 (anticoagulation stopped as it was his first event)

## 2014-07-01 NOTE — Telephone Encounter (Signed)
Pt called; wanted to inform you the Doctors name at [facility] is Dr. Marlane Hatcher.

## 2014-07-01 NOTE — Telephone Encounter (Signed)
Good news.  I talked with Dr. Grayland Ormond at the cancer center.  He was able to take the call for Dr. Mike Gip (she's out of the office this week). Per Dr. Grayland Ormond, the follow up tests were normal (that's the issue I was waiting to hear about) and he can stop the xarelto.   I think the prev issues about the appointment were apparently related to the EMR change over (where it looked like a no show when it wasn't). He is still at a higher than average risk for a clot, but at this point, the risk/benefit ratio in the long run favors stopping xarelto.   If he had another clot, then he would need much long anticoagulation.  I took the med off his list in the EMR.   I want him to call uro about follow up with them. Thanks.

## 2014-07-01 NOTE — Telephone Encounter (Signed)
I called and LM with/answering service for uro MD at [facility].  Will await call back.

## 2014-07-01 NOTE — Telephone Encounter (Signed)
Patient advised.   Patient says he saw Urology this morning and the MD didn't give him any information on what he can do to improve. Patient says he would like for Dr. Damita Dunnings to talk with Urology.  Patient states that he thinks he may need to see someone else but this guy is not much help.  Please advise.

## 2014-07-01 NOTE — Assessment & Plan Note (Signed)
DVT 2015. S/p 6 months of anticoagulation as of 06/2014 (anticoagulation stopped as it was his first event)

## 2014-07-03 ENCOUNTER — Telehealth: Payer: Self-pay | Admitting: Family Medicine

## 2014-07-03 DIAGNOSIS — G500 Trigeminal neuralgia: Secondary | ICD-10-CM

## 2014-07-03 NOTE — Telephone Encounter (Signed)
Please call pt.  I talked to uro MD.  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  Surgery would likely not benefit that.  Intermittent cath is still the best option.  Continue intermittent cath for now, would be reasonable to keep f/u with uro in about 3 months.  If testing shows some improvement, then he may get to the point where surgery would be a good option.  Does he need referral about his trigeminal neuralgia? Thanks.

## 2014-07-03 NOTE — Telephone Encounter (Signed)
Patient notified as instructed by telephone and verbalized understanding. Patient stated that he does want the referral about his trigeminal neuralgia. Advised patient that he will hear back from one of the referral coordinators to get this set up.

## 2014-07-04 NOTE — Telephone Encounter (Signed)
Referral ordered

## 2014-07-05 ENCOUNTER — Ambulatory Visit
Admit: 2014-07-05 | Disposition: A | Discharge status: Home / Self Care | Payer: Self-pay | Attending: Hematology and Oncology | Admitting: Hematology and Oncology

## 2014-07-16 ENCOUNTER — Encounter: Payer: Self-pay | Admitting: Neurology

## 2014-07-16 ENCOUNTER — Ambulatory Visit (INDEPENDENT_AMBULATORY_CARE_PROVIDER_SITE_OTHER): Payer: Medicare Other | Admitting: Neurology

## 2014-07-16 VITALS — BP 124/70 | HR 80 | Resp 20 | Ht 72.0 in | Wt 192.3 lb

## 2014-07-16 DIAGNOSIS — G500 Trigeminal neuralgia: Secondary | ICD-10-CM

## 2014-07-16 MED ORDER — OXCARBAZEPINE 300 MG PO TABS: ORAL_TABLET | ORAL | Status: DC
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
## 2014-07-16 NOTE — Patient Instructions (Addendum)
1.  I will start you on oxcarbazepine 300mg  tablets.  Take 1/2 tablet twice daily for 7 days, then 1 tablet twice daily 2.  I want to check another BMP in 2 weeks 3.  Since the Lyrica is not working, I would stop it.  Take 1 tablet twice daily for 3 days, then 1 tablet daily for 3 days, then stop 4.  Follow up in 6 weeks.

## 2014-07-24 ENCOUNTER — Telehealth: Payer: Self-pay | Admitting: *Deleted

## 2014-07-24 NOTE — Telephone Encounter (Signed)
Per [place] PA was done thru cover my meds for Nitrofurantoin.. See PA folder.

## 2014-07-25 NOTE — Telephone Encounter (Signed)
That was through [place] here so please proceed.  Was on it for UTI prev treatment.  H/o mult UTIs.  Thanks.

## 2014-07-25 NOTE — Telephone Encounter (Signed)
PA was placed through [facility] under [facility] who responded by saying patient is not eligible in their system.  This medication is not on the patient's current meds list.  Is it prescribed by someone else or do I need to pursue this PA further?

## 2014-07-29 ENCOUNTER — Other Ambulatory Visit: Payer: Self-pay | Admitting: Neurology

## 2014-07-29 LAB — LIPID PANEL
CHOL/HDL RATIO: 3 ratio
CHOLESTEROL: 200 mg/dL (ref 0–200)
HDL: 67 mg/dL (ref >=40)
LDL Cholesterol: 122 mg/dL — ABNORMAL HIGH (ref 0–99)
Triglycerides: 54 mg/dL (ref <150)
VLDL: 11 mg/dL (ref 0–40)

## 2014-07-29 NOTE — Telephone Encounter (Signed)
PA resubmitted through [facility], awaiting response.

## 2014-07-30 NOTE — Telephone Encounter (Signed)
PA has been approved.  Patient has been notified.

## 2014-07-30 NOTE — Telephone Encounter (Signed)
Thanks

## 2014-08-13 ENCOUNTER — Other Ambulatory Visit: Payer: Self-pay | Admitting: Neurology

## 2014-08-20 ENCOUNTER — Other Ambulatory Visit: Payer: Self-pay | Admitting: Family Medicine

## 2014-09-03 ENCOUNTER — Encounter: Payer: Self-pay | Admitting: Gastroenterology

## 2014-09-10 ENCOUNTER — Ambulatory Visit (INDEPENDENT_AMBULATORY_CARE_PROVIDER_SITE_OTHER): Payer: Medicare Other | Admitting: Neurology

## 2014-09-10 ENCOUNTER — Encounter: Payer: Self-pay | Admitting: Neurology

## 2014-09-10 ENCOUNTER — Other Ambulatory Visit (INDEPENDENT_AMBULATORY_CARE_PROVIDER_SITE_OTHER): Payer: Medicare Other

## 2014-09-10 VITALS — BP 128/62 | HR 64 | Ht 72.0 in | Wt 185.0 lb

## 2014-09-10 DIAGNOSIS — Z79899 Other long term (current) drug therapy: Secondary | ICD-10-CM | POA: Diagnosis not present

## 2014-09-10 DIAGNOSIS — G500 Trigeminal neuralgia: Secondary | ICD-10-CM | POA: Diagnosis not present

## 2014-09-10 LAB — BASIC METABOLIC PANEL WITH GFR
BUN: 19 mg/dL (ref 6–23)
CO2: 26 meq/L (ref 19–32)
Calcium: 9.1 mg/dL (ref 8.4–10.5)
Chloride: 106 meq/L (ref 96–112)
Creatinine, Ser: 0.97 mg/dL (ref 0.40–1.50)
GFR: 81.97 mL/min
Glucose, Bld: 108 mg/dL — ABNORMAL HIGH (ref 70–99)
Potassium: 3.9 meq/L (ref 3.5–5.1)
Sodium: 138 meq/L (ref 135–145)

## 2014-09-10 MED ORDER — OXCARBAZEPINE 300 MG PO TABS: 600.0000 mg | ORAL_TABLET | Freq: Two times a day (BID) | ORAL | Status: DC

## 2014-09-10 NOTE — Patient Instructions (Addendum)
1.  Increase trileptal (oxcarbazepine) to 2 tablets (600mg ) twice daily.  Call in 1 to 2 weeks with update and we can adjust management if needed. 2.  Will check baseline BMP for medication management. Your provider has requested that you have labwork completed today. Please go to [facility] on the second floor of this building before leaving the office today. You do not need to check in. If you are not called within 15 minutes please check with the front desk.  3.  Follow up in 3 months.
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
## 2014-09-11 ENCOUNTER — Telehealth: Payer: Self-pay | Admitting: Neurology

## 2014-09-11 NOTE — Telephone Encounter (Signed)
LM for pt on VM advising Sodium level is okay. Pt to call with any questions ro concerns / Sherri S.

## 2014-09-11 NOTE — Telephone Encounter (Signed)
-----   Message from Cindra Eves sent at 09/11/2014 12:19 AM EDT ----- Call pt with results and document ----- Message -----    From: Pieter Partridge, DO    Sent: 09/10/2014  11:49 AM      To: Melissa Noon, CMA  Sodium level is okay

## 2014-09-23 ENCOUNTER — Other Ambulatory Visit: Payer: Self-pay | Admitting: Ophthalmology

## 2014-09-23 DIAGNOSIS — R51 Headache: Secondary | ICD-10-CM

## 2014-09-23 DIAGNOSIS — H47092 Other disorders of optic nerve, not elsewhere classified, left eye: Secondary | ICD-10-CM

## 2014-09-23 DIAGNOSIS — H5712 Ocular pain, left eye: Secondary | ICD-10-CM

## 2014-09-23 DIAGNOSIS — G8929 Other chronic pain: Secondary | ICD-10-CM

## 2014-09-24 ENCOUNTER — Ambulatory Visit
Admission: RE | Admit: 2014-09-24 | Discharge: 2014-09-24 | Disposition: A | Discharge status: Home / Self Care | Payer: Medicare Other | Source: Ambulatory Visit | Attending: Ophthalmology | Admitting: Ophthalmology

## 2014-09-24 DIAGNOSIS — H47092 Other disorders of optic nerve, not elsewhere classified, left eye: Secondary | ICD-10-CM

## 2014-09-24 DIAGNOSIS — H5712 Ocular pain, left eye: Secondary | ICD-10-CM

## 2014-09-24 DIAGNOSIS — R519 Headache, unspecified: Secondary | ICD-10-CM

## 2014-09-24 DIAGNOSIS — R51 Headache: Secondary | ICD-10-CM

## 2014-09-24 DIAGNOSIS — G8929 Other chronic pain: Secondary | ICD-10-CM

## 2014-09-24 MED ORDER — GADOBENATE DIMEGLUMINE 529 MG/ML IV SOLN
17.0000 mL | Freq: Once | INTRAVENOUS | Status: AC | PRN
  Administered 2014-09-24: 17 mL via INTRAVENOUS

## 2014-09-25 ENCOUNTER — Other Ambulatory Visit: Payer: Medicare Other

## 2014-10-01 ENCOUNTER — Telehealth: Payer: Self-pay | Admitting: *Deleted

## 2014-10-01 NOTE — Telephone Encounter (Signed)
Please advise  Patient call to report oxcarbazepine is not working

## 2014-10-01 NOTE — Telephone Encounter (Signed)
Please advise 

## 2014-10-01 NOTE — Telephone Encounter (Signed)
Patient call to report oxcarbazepine is not working please advise  Call back number [phone]

## 2014-10-01 NOTE — Telephone Encounter (Signed)
I would add baclofen 10mg  three times daily in addition to the Trileptal 600mg  twice daily.

## 2014-10-02 NOTE — Telephone Encounter (Signed)
Left message for patient to return my call.

## 2014-10-03 ENCOUNTER — Other Ambulatory Visit: Payer: Self-pay | Admitting: *Deleted

## 2014-10-03 MED ORDER — BACLOFEN 10 MG PO TABS: 10.0000 mg | ORAL_TABLET | Freq: Three times a day (TID) | ORAL | Status: DC

## 2014-10-29 ENCOUNTER — Telehealth: Payer: Self-pay | Admitting: Neurology

## 2014-10-29 NOTE — Telephone Encounter (Signed)
Patient will take We can increase oxcarbamazepine to 900mg  twice daily  This has called in to pharmacy

## 2014-10-29 NOTE — Telephone Encounter (Signed)
Patient states Baclofen 10mg  TID is not helping him he is still having issues chewing and jaw pain . Please advise

## 2014-10-29 NOTE — Telephone Encounter (Signed)
Pt states that the medication that we put him on is not working. There has been no change. He does not know the name of the medication please call [phone]

## 2014-10-29 NOTE — Telephone Encounter (Signed)
We can increase oxcarbamazepine to 900mg  twice daily or since he has benefited from Lyrica in the past, offer another trial of this.

## 2014-11-04 ENCOUNTER — Encounter: Payer: Self-pay | Admitting: Gastroenterology

## 2014-11-04 ENCOUNTER — Ambulatory Visit (INDEPENDENT_AMBULATORY_CARE_PROVIDER_SITE_OTHER): Payer: Medicare Other | Admitting: Gastroenterology

## 2014-11-04 VITALS — BP 108/70 | HR 56 | Ht 71.26 in | Wt 187.2 lb

## 2014-11-04 DIAGNOSIS — K6289 Other specified diseases of anus and rectum: Secondary | ICD-10-CM | POA: Diagnosis not present

## 2014-11-04 DIAGNOSIS — K921 Melena: Secondary | ICD-10-CM

## 2014-11-04 MED ORDER — DILTIAZEM GEL 2 %: 1.0000 "application " | Freq: Two times a day (BID) | CUTANEOUS | Status: DC

## 2014-11-04 NOTE — Progress Notes (Signed)
    History of Present Illness: This is a 69 year old male complaining of several weeks of rectal pain and intermittent small-volume rectal bleeding. He underwent colonoscopy in 2014 showing internal hemorrhoids and mild diverticulosis. He states he had mild constipation with hard stools for several days and since then he has had persistent moderate pain and burning with each bowel movement. The pain subsides within an hour after bowel movement. He's been taking a stool softer and Metamucil with no recurrent constipation.  Current Medications, Allergies, Past Medical History, Past Surgical History, Family History and Social History were reviewed in [employer] record.  Physical Exam: General: Well developed , well nourished, no acute distress Head: Normocephalic and atraumatic Eyes:  sclerae anicteric, EOMI Ears: Normal auditory acuity Mouth: No deformity or lesions Lungs: Clear throughout to auscultation Heart: Regular rate and rhythm; no murmurs, rubs or bruits Abdomen: Soft, non tender and non distended. No masses, hepatosplenomegaly or hernias noted. Normal Bowel sounds Rectal: No external lesions. No internal lesions, Hemoccult-negative stool, tender anal canal. No fissure seen. Musculoskeletal: Symmetrical with no gross deformities  Pulses:  Normal pulses noted Extremities: No clubbing, cyanosis, edema or deformities noted Neurological: Alert oriented x 4, grossly nonfocal Psychological:  Alert and cooperative. Normal mood and affect  Assessment and Recommendations:  1. Anal pain and hematochezia. Suspected an anal fissure although one was not seen. Begin diltiazem gel 3 times a day for 8 weeks. Preparation H suppositories daily for 2 weeks and then as needed. Continue stool softeners and Metamucil. Return office visit 8 weeks.

## 2014-11-04 NOTE — Patient Instructions (Addendum)
You can purchase Preperation H suppositories to use daily as needed.  We have sent the following medications to [facility] for you to pick up at your convenience: Diltazem gel to use twice daily x 8 weeks.   Your follow up appt with Dr. Fuller Plan is on 01/13/15 at 2:00pm.  Thank you for choosing me and [facility].  Pricilla Riffle. Dagoberto Ligas., MD., Marval Regal

## 2014-11-13 ENCOUNTER — Ambulatory Visit: Payer: Self-pay | Admitting: Family Medicine

## 2014-11-13 ENCOUNTER — Encounter: Payer: Self-pay | Admitting: Family Medicine

## 2014-11-13 ENCOUNTER — Telehealth: Payer: Self-pay | Admitting: Family Medicine

## 2014-11-13 ENCOUNTER — Ambulatory Visit (INDEPENDENT_AMBULATORY_CARE_PROVIDER_SITE_OTHER): Payer: Medicare Other | Admitting: Family Medicine

## 2014-11-13 ENCOUNTER — Ambulatory Visit
Admission: RE | Admit: 2014-11-13 | Discharge: 2014-11-13 | Disposition: A | Discharge status: Home / Self Care | Payer: Medicare Other | Source: Ambulatory Visit | Attending: Family Medicine | Admitting: Family Medicine

## 2014-11-13 VITALS — BP 110/60 | HR 66 | Temp 97.8°F | Wt 187.8 lb

## 2014-11-13 DIAGNOSIS — Z86718 Personal history of other venous thrombosis and embolism: Secondary | ICD-10-CM

## 2014-11-13 DIAGNOSIS — M79605 Pain in left leg: Secondary | ICD-10-CM

## 2014-11-13 DIAGNOSIS — D6851 Activated protein C resistance: Secondary | ICD-10-CM

## 2014-11-13 DIAGNOSIS — I82432 Acute embolism and thrombosis of left popliteal vein: Secondary | ICD-10-CM | POA: Diagnosis not present

## 2014-11-13 DIAGNOSIS — D688 Other specified coagulation defects: Secondary | ICD-10-CM | POA: Diagnosis not present

## 2014-11-13 MED ORDER — RIVAROXABAN (XARELTO) VTE STARTER PACK (15 & 20 MG): ORAL_TABLET | ORAL | Status: DC

## 2014-11-13 NOTE — Patient Instructions (Signed)

## 2014-11-13 NOTE — Telephone Encounter (Signed)
[facility] Patient Name: [name] DOB: 1945-04-29 Initial Comment Caller states c/o leg pain and swelling  by mistake wrong [name] was scheduled appointment - there are 2 pts with same name and birth date , [name] at the office was able to cancel the wrong pt and schedule the right pt [name] MRN: [id number] in for Dr Edilia Bo at 3:35 today

## 2014-11-13 NOTE — Telephone Encounter (Signed)
Pt has appt with Dr Lorelei Pont 11/13/14 at 3:15 pm; also another note from team health on 11/13/14.
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
## 2014-11-13 NOTE — Telephone Encounter (Signed)
[facility] Patient Name: [name] DOB: 03-16-1946 Initial Comment Caller states c/o leg pain and swelling Nurse Assessment Nurse: Erlene Quan RN, Manuela Schwartz Date/Time Eilene Ghazi Time): 11/13/2014 1:47:06 PM Confirm and document reason for call. If symptomatic, describe symptoms. ---Caller states c/o leg pain and swelling to L leg - states its is behind the knee in the bend of the leg - it is sort of red also - when he is up waling it gets worse - noticed it about 3 days ago - he has it elevated now - he had blood clots in this leg and was on Zerelto back about a year ago - Has the patient traveled out of the country within the last 30 days? ---Not Applicable Does the patient require triage? ---Yes Related visit to physician within the last 2 weeks? ---No Does the PT have any chronic conditions? (i.e. diabetes, asthma, etc.) ---Yes List chronic conditions. ---history of blood clots Guidelines Guideline Title Affirmed Question Affirmed Notes Leg Swelling and Edema [1] Thigh or calf pain AND [2] only 1 side AND [3] present > 1 hour Final Disposition User See Physician within 4 Hours (or PCP triage) Erlene Quan, RN, Manuela Schwartz Comments scheduled pt appointment at 3:15 with Dr Edilia Bo Referrals REFERRED TO PCP OFFICE Disagree/Comply: Comply

## 2014-11-13 NOTE — Progress Notes (Signed)
Pre visit review using our clinic review tool, if applicable. No additional management support is needed unless otherwise documented below in the visit note. 

## 2014-11-14 ENCOUNTER — Telehealth: Payer: Self-pay | Admitting: Family Medicine

## 2014-11-14 ENCOUNTER — Other Ambulatory Visit: Payer: Self-pay | Admitting: *Deleted

## 2014-11-14 DIAGNOSIS — I82402 Acute embolism and thrombosis of unspecified deep veins of left lower extremity: Secondary | ICD-10-CM

## 2014-11-14 MED ORDER — ENOXAPARIN SODIUM 40 MG/0.4ML ~~LOC~~ SOLN: 80.0000 mg | Freq: Two times a day (BID) | SUBCUTANEOUS | Status: DC

## 2014-11-14 MED ORDER — ENOXAPARIN SODIUM 80 MG/0.8ML ~~LOC~~ SOLN: 80.0000 mg | Freq: Two times a day (BID) | SUBCUTANEOUS | Status: DC

## 2014-11-14 NOTE — Telephone Encounter (Signed)
Discussed with Dr. Damita Dunnings who is going to assist in this matter.

## 2014-11-14 NOTE — Telephone Encounter (Signed)
Pt called. He went to pick up his medication last night and it was $800. Is there anything else that can be called in that is not so expensive and would be covered by insurance?  [phone]  [facility] [address]

## 2014-11-14 NOTE — Telephone Encounter (Signed)
Needs PA done on xarelto. In meantime, needs to start lovenox.   rx for lovenox sent.   If he needs teaching for first injection, please get him set up today here.   Stop the lovenox when he can start the xarelto.  Thanks.

## 2014-11-15 NOTE — Telephone Encounter (Signed)
Was he able to get the lovenox?

## 2014-11-15 NOTE — Telephone Encounter (Signed)
PA for Xarelto was completed through [facility] on 11/14/14.  This request has not been approved.  [employer] [place]  will send a letter to the member and you regarding this decision and the right to appeal.

## 2014-11-15 NOTE — Telephone Encounter (Signed)
Yes

## 2014-11-17 NOTE — Telephone Encounter (Addendum)
Noted, thanks.  Do you have a phone number to expedite the appeal?  I don't want him to run out of lovenox in the meantime.  Please get update from patient on his leg sx in the meantime.  Thanks.

## 2014-11-18 NOTE — Telephone Encounter (Signed)
All of that information should be on the letter that I gave you.

## 2014-11-18 NOTE — Telephone Encounter (Signed)
Pt left v/m requesting cb about prior auth xarelto.

## 2014-11-18 NOTE — Telephone Encounter (Signed)
I sent it for scanning.  Please try to expedite the process.

## 2014-11-19 MED ORDER — ENOXAPARIN SODIUM 80 MG/0.8ML ~~LOC~~ SOLN: 80.0000 mg | Freq: Two times a day (BID) | SUBCUTANEOUS | Status: DC

## 2014-11-19 NOTE — Telephone Encounter (Signed)
I refilled the lovenox.  Will await the PA on the xarelto.

## 2014-11-19 NOTE — Telephone Encounter (Signed)
Patient advised of denial and will call his insurance for the appeal process and ask that they contact us ASAP with any paperwork.  In the meantime, patient took his last Lovenox shot this morning.  Please advise.

## 2014-11-19 NOTE — Telephone Encounter (Signed)
Spoke with patient and he says that [facility] has it taken care of and that he has actually already received the text from the pharmacy that his medication is ready for pick up.  Patient asks if he is to just continue the Xarelto or come back for a return OV?

## 2014-11-20 NOTE — Telephone Encounter (Signed)
[name] with [employer] left v/m that xarelto does not require review for quantity limitation and this quantity limitation request is cancelled. No cb needed.

## 2014-11-20 NOTE — Telephone Encounter (Signed)
Start xarelto, stop lovenox, schedule f/u here so I can recheck him.  I took lovenox off the med list.   We can talk about xarelto duration at the [facility].  Thanks.

## 2014-11-20 NOTE — Telephone Encounter (Signed)
Patient notified as instructed by telephone and verbalized understanding. Appointment scheduled with Dr. Damita Dunnings 11/25/14.

## 2014-11-25 ENCOUNTER — Encounter: Payer: Self-pay | Admitting: Family Medicine

## 2014-11-25 ENCOUNTER — Ambulatory Visit (INDEPENDENT_AMBULATORY_CARE_PROVIDER_SITE_OTHER): Payer: Medicare Other | Admitting: Family Medicine

## 2014-11-25 ENCOUNTER — Telehealth: Payer: Self-pay

## 2014-11-25 VITALS — BP 100/70 | HR 56 | Temp 97.7°F | Wt 188.2 lb

## 2014-11-25 DIAGNOSIS — G500 Trigeminal neuralgia: Secondary | ICD-10-CM

## 2014-11-25 DIAGNOSIS — I82402 Acute embolism and thrombosis of unspecified deep veins of left lower extremity: Secondary | ICD-10-CM | POA: Diagnosis not present

## 2014-11-25 DIAGNOSIS — R399 Unspecified symptoms and signs involving the genitourinary system: Secondary | ICD-10-CM | POA: Diagnosis not present

## 2014-11-25 DIAGNOSIS — B880 Other acariasis: Secondary | ICD-10-CM | POA: Diagnosis not present

## 2014-11-25 MED ORDER — OXCARBAZEPINE 300 MG PO TABS
900.0000 mg | ORAL_TABLET | Freq: Three times a day (TID) | ORAL | Status: DC
Start: 2014-11-25 — End: 2015-01-01

## 2014-11-25 MED ORDER — FLUOCINONIDE 0.1 % EX CREA: TOPICAL_CREAM | CUTANEOUS | Status: DC

## 2014-11-25 MED ORDER — RIVAROXABAN 20 MG PO TABS: 20.0000 mg | ORAL_TABLET | Freq: Every day | ORAL | Status: DC

## 2014-11-25 MED ORDER — FLUOCINONIDE 0.05 % EX CREA: 1.0000 "application " | TOPICAL_CREAM | Freq: Two times a day (BID) | CUTANEOUS | Status: DC

## 2014-11-25 NOTE — Progress Notes (Signed)
Pre visit review using our clinic review tool, if applicable. No additional management support is needed unless otherwise documented below in the visit note.  Presumed 2nd clot.  Off anticoagulation at the time.  Now back on xarelto.  Less L leg edema.  No SOB, no CP.  Less swelling, much less pain, none in the back of the L calf now.  No bleeding.  Noted 5 leiden. Prev note from Dr. Lorelei Pont and u/s reviewed re: DVT.    Recent chigger bites.  Itching.  Exposed about 3 days ago.  R leg and R foot.    His urinary sx continue- still needing BID caths at home.  No infections recently on the macrobid.    Trigeminal neuralgia.  Per neurology.  Less pain on current med dose.    PMH and SH reviewed  ROS: See HPI, otherwise noncontributory.  Meds, vitals, and allergies reviewed.   nad ncat Mmm Neck supple, no LA rrr ctab abd soft, not ttp.  L calf edema noted but improved per patient.  R calf circ 37cm L calf circ 39cm.   Blanching red spots on R leg and R foot.   No ulceration.

## 2014-11-25 NOTE — Patient Instructions (Addendum)
Use the cream twice a day only on the itchy areas.   Continue 20mg  of xarelto daily after you finish the starter pack.  Rosaria Ferries will call about your referral. Take care.  Glad to see you.

## 2014-11-25 NOTE — Telephone Encounter (Signed)
[facility] left v/m; fluocinonide 0.1 % cream; [facility] and [facility] to know if can substitute Fluocinonide 0.05% cream. Med could be ordered but pt wants to pick up today.[facility] request cb.

## 2014-11-25 NOTE — Telephone Encounter (Signed)
Patient advised.

## 2014-11-25 NOTE — Telephone Encounter (Signed)
Okay to change.  rx resent.  Thanks.

## 2014-11-26 ENCOUNTER — Encounter: Payer: Self-pay | Admitting: Family Medicine

## 2014-11-26 DIAGNOSIS — B880 Other acariasis: Secondary | ICD-10-CM | POA: Insufficient documentation

## 2014-11-26 NOTE — Assessment & Plan Note (Signed)
Improved.  D/w pt.  Continue as is.

## 2014-11-26 NOTE — Assessment & Plan Note (Signed)
Continue cath.  With anticoagulation noted, no blood noted per patient.

## 2014-11-26 NOTE — Assessment & Plan Note (Signed)
Presumed 2nd episode, refer back to hematology.  Will likely need long term anticoagulation.  D/w pt.  No bleeding now.  Okay to continue xarelto for now.  He agrees.

## 2014-11-26 NOTE — Assessment & Plan Note (Signed)
Can use fluocinonide prn and f/u prn.  He agrees. Doesn't look infected.

## 2014-11-27 ENCOUNTER — Other Ambulatory Visit: Payer: Self-pay | Admitting: Family Medicine

## 2014-11-28 NOTE — Telephone Encounter (Signed)
Ok to fill 

## 2014-11-29 NOTE — Telephone Encounter (Signed)
Sent. Thanks.   

## 2014-12-02 ENCOUNTER — Inpatient Hospital Stay: Payer: Medicare Other

## 2014-12-02 ENCOUNTER — Inpatient Hospital Stay: Payer: Medicare Other | Attending: Hematology and Oncology | Admitting: Hematology and Oncology

## 2014-12-02 ENCOUNTER — Encounter: Payer: Self-pay | Admitting: Hematology and Oncology

## 2014-12-02 VITALS — BP 135/85 | HR 55 | Temp 95.4°F | Resp 18 | Ht 72.0 in | Wt 188.4 lb

## 2014-12-02 DIAGNOSIS — Z86718 Personal history of other venous thrombosis and embolism: Secondary | ICD-10-CM | POA: Insufficient documentation

## 2014-12-02 DIAGNOSIS — Z8042 Family history of malignant neoplasm of prostate: Secondary | ICD-10-CM | POA: Insufficient documentation

## 2014-12-02 DIAGNOSIS — D6851 Activated protein C resistance: Secondary | ICD-10-CM | POA: Diagnosis not present

## 2014-12-02 DIAGNOSIS — K219 Gastro-esophageal reflux disease without esophagitis: Secondary | ICD-10-CM | POA: Diagnosis not present

## 2014-12-02 DIAGNOSIS — Z85828 Personal history of other malignant neoplasm of skin: Secondary | ICD-10-CM | POA: Insufficient documentation

## 2014-12-02 DIAGNOSIS — I82402 Acute embolism and thrombosis of unspecified deep veins of left lower extremity: Secondary | ICD-10-CM | POA: Diagnosis not present

## 2014-12-02 DIAGNOSIS — Z87891 Personal history of nicotine dependence: Secondary | ICD-10-CM | POA: Insufficient documentation

## 2014-12-02 DIAGNOSIS — Z79899 Other long term (current) drug therapy: Secondary | ICD-10-CM | POA: Insufficient documentation

## 2014-12-02 DIAGNOSIS — Z7901 Long term (current) use of anticoagulants: Secondary | ICD-10-CM | POA: Insufficient documentation

## 2014-12-02 DIAGNOSIS — Z800 Family history of malignant neoplasm of digestive organs: Secondary | ICD-10-CM | POA: Diagnosis not present

## 2014-12-02 DIAGNOSIS — Z87440 Personal history of urinary (tract) infections: Secondary | ICD-10-CM | POA: Diagnosis not present

## 2014-12-02 DIAGNOSIS — K5790 Diverticulosis of intestine, part unspecified, without perforation or abscess without bleeding: Secondary | ICD-10-CM | POA: Insufficient documentation

## 2014-12-02 DIAGNOSIS — I82409 Acute embolism and thrombosis of unspecified deep veins of unspecified lower extremity: Secondary | ICD-10-CM | POA: Insufficient documentation

## 2014-12-02 DIAGNOSIS — I10 Essential (primary) hypertension: Secondary | ICD-10-CM | POA: Insufficient documentation

## 2014-12-02 LAB — CBC WITH DIFFERENTIAL/PLATELET
Basophils Absolute: 0 10*3/uL (ref 0–0.1)
Basophils Relative: 1 %
Eosinophils Absolute: 0.3 10*3/uL (ref 0–0.7)
Eosinophils Relative: 7 %
HCT: 41.4 % (ref 40.0–52.0)
Hemoglobin: 13.7 g/dL (ref 13.0–18.0)
Lymphocytes Relative: 27 %
Lymphs Abs: 1 10*3/uL (ref 1.0–3.6)
MCH: 28.8 pg (ref 26.0–34.0)
MCHC: 33.1 g/dL (ref 32.0–36.0)
MCV: 87 fL (ref 80.0–100.0)
Monocytes Absolute: 0.3 10*3/uL (ref 0.2–1.0)
Monocytes Relative: 9 %
Neutro Abs: 2.1 10*3/uL (ref 1.4–6.5)
Neutrophils Relative %: 56 %
Platelets: 199 10*3/uL (ref 150–440)
RBC: 4.76 MIL/uL (ref 4.40–5.90)
RDW: 14.2 % (ref 11.5–14.5)
WBC: 3.8 10*3/uL (ref 3.8–10.6)

## 2014-12-02 LAB — COMPREHENSIVE METABOLIC PANEL
ALT: 16 U/L — ABNORMAL LOW (ref 17–63)
AST: 17 U/L (ref 15–41)
Albumin: 4.1 g/dL (ref 3.5–5.0)
Alkaline Phosphatase: 72 U/L (ref 38–126)
Anion gap: 6 (ref 5–15)
BUN: 21 mg/dL — ABNORMAL HIGH (ref 6–20)
CO2: 27 mmol/L (ref 22–32)
Calcium: 8.6 mg/dL — ABNORMAL LOW (ref 8.9–10.3)
Chloride: 106 mmol/L (ref 101–111)
Creatinine, Ser: 0.89 mg/dL (ref 0.61–1.24)
GFR calc Af Amer: >60 mL/min (ref >60)
GFR calc non Af Amer: >60 mL/min (ref >60)
Glucose, Bld: 108 mg/dL — ABNORMAL HIGH (ref 65–99)
Potassium: 4.5 mmol/L (ref 3.5–5.1)
Sodium: 139 mmol/L (ref 135–145)
Total Bilirubin: 0.4 mg/dL (ref 0.3–1.2)
Total Protein: 6.7 g/dL (ref 6.5–8.1)

## 2014-12-02 NOTE — Progress Notes (Signed)
New clot in left leg since last seen in clinic
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
## 2014-12-03 LAB — CEA: CEA: 0.8 ng/mL (ref 0.0–4.7)

## 2014-12-03 LAB — CANCER ANTIGEN 19-9: CA 19-9: 8 U/mL (ref 0–35)

## 2014-12-05 ENCOUNTER — Other Ambulatory Visit: Payer: Self-pay | Admitting: *Deleted

## 2014-12-05 ENCOUNTER — Ambulatory Visit: Payer: Medicare Other

## 2014-12-05 ENCOUNTER — Other Ambulatory Visit: Payer: Self-pay | Admitting: Hematology and Oncology

## 2014-12-05 DIAGNOSIS — C801 Malignant (primary) neoplasm, unspecified: Secondary | ICD-10-CM

## 2014-12-06 ENCOUNTER — Ambulatory Visit
Admission: RE | Admit: 2014-12-06 | Discharge: 2014-12-06 | Disposition: A | Discharge status: Home / Self Care | Payer: Medicare Other | Source: Ambulatory Visit | Attending: Hematology and Oncology | Admitting: Hematology and Oncology

## 2014-12-06 DIAGNOSIS — I871 Compression of vein: Secondary | ICD-10-CM | POA: Insufficient documentation

## 2014-12-06 DIAGNOSIS — N323 Diverticulum of bladder: Secondary | ICD-10-CM | POA: Insufficient documentation

## 2014-12-06 DIAGNOSIS — I82402 Acute embolism and thrombosis of unspecified deep veins of left lower extremity: Secondary | ICD-10-CM

## 2014-12-06 DIAGNOSIS — N400 Enlarged prostate without lower urinary tract symptoms: Secondary | ICD-10-CM | POA: Insufficient documentation

## 2014-12-06 DIAGNOSIS — Z86718 Personal history of other venous thrombosis and embolism: Secondary | ICD-10-CM | POA: Diagnosis not present

## 2014-12-06 MED ORDER — IOHEXOL 350 MG/ML SOLN
100.0000 mL | Freq: Once | INTRAVENOUS | Status: AC | PRN
  Administered 2014-12-06: 100 mL via INTRAVENOUS

## 2014-12-10 ENCOUNTER — Other Ambulatory Visit: Payer: Self-pay | Admitting: Hematology and Oncology

## 2014-12-10 ENCOUNTER — Inpatient Hospital Stay: Payer: Medicare Other | Attending: Hematology and Oncology | Admitting: Hematology and Oncology

## 2014-12-10 VITALS — BP 128/81 | HR 61 | Temp 97.8°F | Ht 72.0 in | Wt 188.3 lb

## 2014-12-10 DIAGNOSIS — Z87891 Personal history of nicotine dependence: Secondary | ICD-10-CM | POA: Diagnosis not present

## 2014-12-10 DIAGNOSIS — I871 Compression of vein: Secondary | ICD-10-CM | POA: Insufficient documentation

## 2014-12-10 DIAGNOSIS — N323 Diverticulum of bladder: Secondary | ICD-10-CM

## 2014-12-10 DIAGNOSIS — Z86718 Personal history of other venous thrombosis and embolism: Secondary | ICD-10-CM | POA: Insufficient documentation

## 2014-12-10 DIAGNOSIS — K219 Gastro-esophageal reflux disease without esophagitis: Secondary | ICD-10-CM | POA: Insufficient documentation

## 2014-12-10 DIAGNOSIS — Z7901 Long term (current) use of anticoagulants: Secondary | ICD-10-CM | POA: Diagnosis not present

## 2014-12-10 DIAGNOSIS — I82402 Acute embolism and thrombosis of unspecified deep veins of left lower extremity: Secondary | ICD-10-CM

## 2014-12-10 DIAGNOSIS — I10 Essential (primary) hypertension: Secondary | ICD-10-CM | POA: Diagnosis not present

## 2014-12-10 DIAGNOSIS — Z79899 Other long term (current) drug therapy: Secondary | ICD-10-CM | POA: Diagnosis not present

## 2014-12-10 DIAGNOSIS — Z85828 Personal history of other malignant neoplasm of skin: Secondary | ICD-10-CM

## 2014-12-10 DIAGNOSIS — D6851 Activated protein C resistance: Secondary | ICD-10-CM | POA: Diagnosis not present

## 2014-12-10 NOTE — Progress Notes (Signed)
Patient here for follow up.  No complaints. 

## 2014-12-12 ENCOUNTER — Other Ambulatory Visit: Payer: Self-pay | Admitting: Interventional Radiology

## 2014-12-12 ENCOUNTER — Ambulatory Visit
Admission: RE | Admit: 2014-12-12 | Discharge: 2014-12-12 | Disposition: A | Discharge status: Home / Self Care | Payer: Self-pay | Source: Ambulatory Visit | Attending: Interventional Radiology | Admitting: Interventional Radiology

## 2014-12-12 ENCOUNTER — Ambulatory Visit
Admission: RE | Admit: 2014-12-12 | Discharge: 2014-12-12 | Disposition: A | Discharge status: Home / Self Care | Payer: Medicare Other | Source: Ambulatory Visit | Attending: Hematology and Oncology | Admitting: Hematology and Oncology

## 2014-12-12 DIAGNOSIS — I82439 Acute embolism and thrombosis of unspecified popliteal vein: Secondary | ICD-10-CM | POA: Insufficient documentation

## 2014-12-12 DIAGNOSIS — I871 Compression of vein: Secondary | ICD-10-CM

## 2014-12-12 DIAGNOSIS — I82402 Acute embolism and thrombosis of unspecified deep veins of left lower extremity: Secondary | ICD-10-CM
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
## 2014-12-15 ENCOUNTER — Encounter: Payer: Self-pay | Admitting: Hematology and Oncology
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
## 2014-12-23 ENCOUNTER — Encounter: Payer: Self-pay | Admitting: Neurology

## 2014-12-23 ENCOUNTER — Ambulatory Visit (INDEPENDENT_AMBULATORY_CARE_PROVIDER_SITE_OTHER): Payer: Medicare Other | Admitting: Neurology

## 2014-12-23 VITALS — BP 110/70 | HR 55 | Ht 72.0 in | Wt 187.0 lb

## 2014-12-23 DIAGNOSIS — G500 Trigeminal neuralgia: Secondary | ICD-10-CM | POA: Diagnosis not present
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
## 2014-12-23 NOTE — Patient Instructions (Signed)
Continue oxcarbazepine 900mg  twice daily and baclofen 10mg  three times daily for now We will find out physicians who can perform gamma knife (and you find out names as well) and will send referral Follow up in 3 months.  If you would like to make changes to medication, please call

## 2014-12-25 ENCOUNTER — Other Ambulatory Visit: Payer: Self-pay | Admitting: Neurology

## 2015-01-01 ENCOUNTER — Telehealth: Payer: Self-pay | Admitting: Neurology

## 2015-01-01 MED ORDER — OXCARBAZEPINE 300 MG PO TABS: 900.0000 mg | ORAL_TABLET | Freq: Two times a day (BID) | ORAL | Status: DC

## 2015-01-01 NOTE — Telephone Encounter (Signed)
Pt called for a refill of Oxcarbazepine//call back @ [phone] (can leave a message if no answer)

## 2015-01-01 NOTE — Telephone Encounter (Signed)
Called patient to inform med sent to pharmacy  

## 2015-01-01 NOTE — Telephone Encounter (Signed)
Dr Tomi Likens, Can patient have refill?

## 2015-01-01 NOTE — Telephone Encounter (Signed)
Okay to refill oxcarbazepine 900mg  twice daily

## 2015-01-09 ENCOUNTER — Other Ambulatory Visit (HOSPITAL_COMMUNITY): Payer: Self-pay | Admitting: Interventional Radiology

## 2015-01-09 DIAGNOSIS — I871 Compression of vein: Secondary | ICD-10-CM

## 2015-01-13 ENCOUNTER — Ambulatory Visit (INDEPENDENT_AMBULATORY_CARE_PROVIDER_SITE_OTHER): Payer: Medicare Other | Admitting: Gastroenterology

## 2015-01-13 ENCOUNTER — Other Ambulatory Visit: Payer: Self-pay | Admitting: Radiology

## 2015-01-13 ENCOUNTER — Encounter: Payer: Self-pay | Admitting: Gastroenterology

## 2015-01-13 VITALS — BP 102/70 | HR 60 | Ht 72.0 in | Wt 187.2 lb

## 2015-01-13 DIAGNOSIS — K602 Anal fissure, unspecified: Secondary | ICD-10-CM

## 2015-01-13 NOTE — Progress Notes (Signed)
    History of Present Illness: This is a 69 year old male returning for follow-up of a presumed anal fissure. His rectal pain and hematochezia gradually improved over the past several weeks and totally abated 2 weeks ago. He continues to use diltiazem cream as prescribed.  Current Medications, Allergies, Past Medical History, Past Surgical History, Family History and Social History were reviewed in [employer] record.  Physical Exam: General: Well developed, well nourished, no acute distress Head: Normocephalic and atraumatic Eyes:  sclerae anicteric, EOMI Ears: Normal auditory acuity Mouth: No deformity or lesions Lungs: Clear throughout to auscultation Heart: Regular rate and rhythm; no murmurs, rubs or bruits Abdomen: Soft, non tender and non distended. No masses, hepatosplenomegaly or hernias noted. Normal Bowel sounds Musculoskeletal: Symmetrical with no gross deformities  Pulses:  Normal pulses noted Extremities: No clubbing, cyanosis, edema or deformities noted Neurological: Alert oriented x 4, grossly nonfocal Psychological:  Alert and cooperative. Normal mood and affect  Assessment and Recommendations:  1. Presumed anal fissure, symptomatically resolved. Continue diltiazem cream 3 times a day for 4 more weeks and then discontinue. Return when necessary.

## 2015-01-13 NOTE — Patient Instructions (Signed)
Continue Diltiazem cream for one more month. Then discontinue.   Follow up with Dr. Fuller Plan as needed.  Thank you for choosing me and [facility].  Pricilla Riffle. Dagoberto Ligas., MD., Marval Regal

## 2015-01-14 ENCOUNTER — Ambulatory Visit (HOSPITAL_COMMUNITY)
Admission: RE | Admit: 2015-01-14 | Discharge: 2015-01-14 | Disposition: A | Discharge status: Home / Self Care | Payer: Medicare Other | Source: Ambulatory Visit | Attending: Interventional Radiology | Admitting: Interventional Radiology

## 2015-01-14 ENCOUNTER — Other Ambulatory Visit (HOSPITAL_COMMUNITY): Payer: Self-pay | Admitting: Interventional Radiology

## 2015-01-14 ENCOUNTER — Encounter (HOSPITAL_COMMUNITY): Payer: Self-pay

## 2015-01-14 DIAGNOSIS — D6851 Activated protein C resistance: Secondary | ICD-10-CM | POA: Diagnosis not present

## 2015-01-14 DIAGNOSIS — Z79899 Other long term (current) drug therapy: Secondary | ICD-10-CM | POA: Diagnosis not present

## 2015-01-14 DIAGNOSIS — Z86718 Personal history of other venous thrombosis and embolism: Secondary | ICD-10-CM | POA: Diagnosis present

## 2015-01-14 DIAGNOSIS — Z7902 Long term (current) use of antithrombotics/antiplatelets: Secondary | ICD-10-CM | POA: Insufficient documentation

## 2015-01-14 DIAGNOSIS — Z8249 Family history of ischemic heart disease and other diseases of the circulatory system: Secondary | ICD-10-CM | POA: Insufficient documentation

## 2015-01-14 DIAGNOSIS — I871 Compression of vein: Secondary | ICD-10-CM

## 2015-01-14 DIAGNOSIS — Z833 Family history of diabetes mellitus: Secondary | ICD-10-CM | POA: Insufficient documentation

## 2015-01-14 DIAGNOSIS — Z87891 Personal history of nicotine dependence: Secondary | ICD-10-CM | POA: Insufficient documentation

## 2015-01-14 DIAGNOSIS — I10 Essential (primary) hypertension: Secondary | ICD-10-CM | POA: Diagnosis not present

## 2015-01-14 DIAGNOSIS — I82522 Chronic embolism and thrombosis of left iliac vein: Secondary | ICD-10-CM | POA: Insufficient documentation

## 2015-01-14 DIAGNOSIS — K219 Gastro-esophageal reflux disease without esophagitis: Secondary | ICD-10-CM | POA: Diagnosis not present

## 2015-01-14 LAB — PROTIME-INR
INR: 1.05 (ref 0.00–1.49)
Prothrombin Time: 13.9 seconds (ref 11.6–15.2)

## 2015-01-14 LAB — CBC
HEMATOCRIT: 44.4 % (ref 39.0–52.0)
Hemoglobin: 15 g/dL (ref 13.0–17.0)
MCH: 29.8 pg (ref 26.0–34.0)
MCHC: 33.8 g/dL (ref 30.0–36.0)
MCV: 88.1 fL (ref 78.0–100.0)
Platelets: 191 10*3/uL (ref 150–400)
RBC: 5.04 MIL/uL (ref 4.22–5.81)
RDW: 12.8 % (ref 11.5–15.5)
WBC: 3.7 10*3/uL — ABNORMAL LOW (ref 4.0–10.5)

## 2015-01-14 LAB — BASIC METABOLIC PANEL
Anion gap: 10 (ref 5–15)
BUN: 14 mg/dL (ref 6–20)
CHLORIDE: 105 mmol/L (ref 101–111)
CO2: 26 mmol/L (ref 22–32)
CREATININE: 0.94 mg/dL (ref 0.61–1.24)
Calcium: 9.4 mg/dL (ref 8.9–10.3)
GFR calc Af Amer: >60 mL/min (ref >60)
GFR calc non Af Amer: >60 mL/min (ref >60)
GLUCOSE: 90 mg/dL (ref 65–99)
POTASSIUM: 4.3 mmol/L (ref 3.5–5.1)
Sodium: 141 mmol/L (ref 135–145)

## 2015-01-14 LAB — APTT: APTT: 29 s (ref 24–37)

## 2015-01-14 MED ORDER — LIDOCAINE HCL 1 % IJ SOLN
INTRAMUSCULAR | Status: AC
  Filled 2015-01-14: qty 20

## 2015-01-14 MED ORDER — IOHEXOL 300 MG/ML  SOLN
100.0000 mL | Freq: Once | INTRAMUSCULAR | Status: DC | PRN
  Administered 2015-01-14: 15 mL via INTRAVENOUS
  Filled 2015-01-14: qty 100

## 2015-01-14 MED ORDER — SODIUM CHLORIDE 0.9 % IV SOLN: Freq: Once | INTRAVENOUS | Status: DC

## 2015-01-14 MED ORDER — FENTANYL CITRATE (PF) 100 MCG/2ML IJ SOLN
INTRAMUSCULAR | Status: AC
  Filled 2015-01-14: qty 2

## 2015-01-14 MED ORDER — FENTANYL CITRATE (PF) 100 MCG/2ML IJ SOLN
INTRAMUSCULAR | Status: AC | PRN
  Administered 2015-01-14: 50 ug via INTRAVENOUS

## 2015-01-14 MED ORDER — MIDAZOLAM HCL 2 MG/2ML IJ SOLN
INTRAMUSCULAR | Status: AC
  Filled 2015-01-14: qty 4

## 2015-01-14 MED ORDER — SODIUM CHLORIDE 0.9 % IV SOLN
INTRAVENOUS | Status: AC | PRN
  Administered 2015-01-14: 10 mL/h via INTRAVENOUS

## 2015-01-14 MED ORDER — HEPARIN SODIUM (PORCINE) 1000 UNIT/ML IJ SOLN
INTRAMUSCULAR | Status: AC
  Filled 2015-01-14: qty 1

## 2015-01-14 MED ORDER — MIDAZOLAM HCL 2 MG/2ML IJ SOLN
INTRAMUSCULAR | Status: AC | PRN
  Administered 2015-01-14: 1 mg via INTRAVENOUS

## 2015-01-14 NOTE — Procedures (Signed)
Interventional Radiology Procedure Note  Procedure:  Venography of left lower extremity  Complications:  None  Estimated Blood Loss: < 10 mL  Access of left common femoral vein.  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 Excellent collateral vein formation in pelvis and into hemiazygous/paraspinal veins. No reconstruction performed.  Venetia Night. Kathlene Cote, M.D Pager:  [phone]

## 2015-01-14 NOTE — Progress Notes (Signed)
Awaiting discharge home with daughter; left groin dressing CDI, unremarkable; no c/o

## 2015-01-14 NOTE — Discharge Instructions (Signed)
Angiogram, Care After °Refer to this sheet in the next few weeks. These instructions provide you with information about caring for yourself after your procedure. Your health care provider may also give you more specific instructions. Your treatment has been planned according to current medical practices, but problems sometimes occur. Call your health care provider if you have any problems or questions after your procedure. °WHAT TO EXPECT AFTER THE PROCEDURE °After your procedure, it is typical to have the following: °· Bruising at the catheter insertion site that usually fades within 1-2 weeks. °· Blood collecting in the tissue (hematoma) that may be painful to the touch. It should usually decrease in size and tenderness within 1-2 weeks. °HOME CARE INSTRUCTIONS °· Take medicines only as directed by your health care provider. °· You may shower 24-48 hours after the procedure or as directed by your health care provider. Remove the bandage (dressing) and gently wash the site with plain soap and water. Pat the area dry with a clean towel. Do not rub the site, because this may cause bleeding. °· Do not take baths, swim, or use a hot tub until your health care provider approves. °· Check your insertion site every day for redness, swelling, or drainage. °· Do not apply powder or lotion to the site. °· Do not lift over 10 lb (4.5 kg) for 5 days after your procedure or as directed by your health care provider. °· Ask your health care provider when it is okay to: °¨ Return to work or school. °¨ Resume usual physical activities or sports. °¨ Resume sexual activity. °· Do not drive home if you are discharged the same day as the procedure. Have someone else drive you. °· You may drive 24 hours after the procedure unless otherwise instructed by your health care provider. °· Do not operate machinery or power tools for 24 hours after the procedure or as directed by your health care provider. °· If your procedure was done as an  outpatient procedure, which means that you went home the same day as your procedure, a responsible adult should be with you for the first 24 hours after you arrive home. °· Keep all follow-up visits as directed by your health care provider. This is important. °SEEK MEDICAL CARE IF: °· You have a fever. °· You have chills. °· You have increased bleeding from the catheter insertion site. Hold pressure on the site. °SEEK IMMEDIATE MEDICAL CARE IF: °· You have unusual pain at the catheter insertion site. °· You have redness, warmth, or swelling at the catheter insertion site. °· You have drainage (other than a small amount of blood on the dressing) from the catheter insertion site. °· The catheter insertion site is bleeding, and the bleeding does not stop after 30 minutes of holding steady pressure on the site. °· The area near or just beyond the catheter insertion site becomes pale, cool, tingly, or numb. °  °This information is not intended to replace advice given to you by your health care provider. Make sure you discuss any questions you have with your health care provider. °  °Document Released: 10/08/2004 Document Revised: 04/12/2014 Document Reviewed: 08/23/2012 °Elsevier Interactive Patient Education ©2016 [employer] ° °
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
## 2015-01-14 NOTE — Sedation Documentation (Signed)
Patient denies pain and is resting comfortably.  

## 2015-01-14 NOTE — Sedation Documentation (Signed)
MD at bedside.d/w pt findings of venogram

## 2015-01-29 ENCOUNTER — Ambulatory Visit (INDEPENDENT_AMBULATORY_CARE_PROVIDER_SITE_OTHER): Payer: Medicare Other

## 2015-01-29 DIAGNOSIS — Z23 Encounter for immunization: Secondary | ICD-10-CM

## 2015-02-04 ENCOUNTER — Encounter (HOSPITAL_COMMUNITY): Payer: Self-pay | Admitting: *Deleted

## 2015-02-04 ENCOUNTER — Emergency Department (HOSPITAL_COMMUNITY)
Admission: EM | Admit: 2015-02-04 | Discharge: 2015-02-04 | Disposition: A | Discharge status: Home / Self Care | Payer: Medicare Other | Attending: Emergency Medicine | Admitting: Emergency Medicine

## 2015-02-04 ENCOUNTER — Emergency Department (HOSPITAL_COMMUNITY): Payer: Medicare Other

## 2015-02-04 DIAGNOSIS — Y9389 Activity, other specified: Secondary | ICD-10-CM | POA: Insufficient documentation

## 2015-02-04 DIAGNOSIS — S67195A Crushing injury of left ring finger, initial encounter: Secondary | ICD-10-CM | POA: Insufficient documentation

## 2015-02-04 DIAGNOSIS — S6992XA Unspecified injury of left wrist, hand and finger(s), initial encounter: Secondary | ICD-10-CM | POA: Diagnosis present

## 2015-02-04 DIAGNOSIS — Y998 Other external cause status: Secondary | ICD-10-CM | POA: Insufficient documentation

## 2015-02-04 DIAGNOSIS — S6710XA Crushing injury of unspecified finger(s), initial encounter: Secondary | ICD-10-CM

## 2015-02-04 DIAGNOSIS — Y9289 Other specified places as the place of occurrence of the external cause: Secondary | ICD-10-CM | POA: Insufficient documentation

## 2015-02-04 DIAGNOSIS — W231XXA Caught, crushed, jammed, or pinched between stationary objects, initial encounter: Secondary | ICD-10-CM | POA: Insufficient documentation

## 2015-02-04 HISTORY — DX: Acute embolism and thrombosis of unspecified deep veins of unspecified lower extremity: I82.409

## 2015-02-04 HISTORY — DX: Essential (primary) hypertension: I10

## 2015-02-04 MED ORDER — HYDROMORPHONE HCL 1 MG/ML IJ SOLN
0.5000 mg | Freq: Once | INTRAMUSCULAR | Status: AC
  Administered 2015-02-04: 0.5 mg via INTRAVENOUS
  Filled 2015-02-04: qty 1

## 2015-02-04 MED ORDER — OXYCODONE-ACETAMINOPHEN 5-325 MG PO TABS: 1.0000 | ORAL_TABLET | ORAL | Status: DC | PRN

## 2015-02-04 MED ORDER — TETANUS-DIPHTH-ACELL PERTUSSIS 5-2.5-18.5 LF-MCG/0.5 IM SUSP
0.5000 mL | Freq: Once | INTRAMUSCULAR | Status: AC
  Administered 2015-02-04: 0.5 mL via INTRAMUSCULAR
  Filled 2015-02-04: qty 0.5

## 2015-02-04 MED ORDER — OXYCODONE-ACETAMINOPHEN 5-325 MG PO TABS
2.0000 | ORAL_TABLET | Freq: Once | ORAL | Status: AC
  Administered 2015-02-04: 2 via ORAL
  Filled 2015-02-04: qty 2

## 2015-02-04 MED ORDER — CEFAZOLIN SODIUM-DEXTROSE 2-3 GM-% IV SOLR
2.0000 g | Freq: Once | INTRAVENOUS | Status: AC
Start: 2015-02-04 — End: 2015-02-04
  Administered 2015-02-04: 2 g via INTRAVENOUS
  Filled 2015-02-04: qty 50

## 2015-02-04 MED ORDER — BUPIVACAINE HCL (PF) 0.5 % IJ SOLN
30.0000 mL | Freq: Once | INTRAMUSCULAR | Status: AC
  Administered 2015-02-04: 10 mL
  Filled 2015-02-04: qty 30

## 2015-02-04 MED ORDER — LIDOCAINE HCL (PF) 1 % IJ SOLN
30.0000 mL | Freq: Once | INTRAMUSCULAR | Status: AC
  Administered 2015-02-04: 30 mL
  Filled 2015-02-04: qty 30

## 2015-02-04 NOTE — ED Notes (Addendum)
Pt mashed left ring finger on wood splitter and appears to have partial amputation from under the nail .  PT is on Xarelto for blood clots

## 2015-02-04 NOTE — ED Notes (Signed)
Called xray and advised to take patient to A10 when xray is complete.

## 2015-02-04 NOTE — ED Notes (Signed)
Hand surgery at bedside.

## 2015-02-04 NOTE — ED Provider Notes (Signed)
CSN: [id number]     Arrival date & time 02/04/15  1317 History   First MD Initiated Contact with Patient 02/04/15 1357     Chief Complaint  Patient presents with  . Finger Injury     (Consider location/radiation/quality/duration/timing/severity/associated sxs/prior Treatment) The history is provided by the patient.   patient presents after crush injury to his left ring finger. It was changed between a board and a flat side. Last ate this morning. Tetanus is not up-to-date. He is on anticoagulation for previous DVT.  Past Medical History  Diagnosis Date  . Hypertension   . DVT of leg (deep venous thrombosis) ([place])    History reviewed. No pertinent past surgical history. No family history on file. Social History  Substance Use Topics  . Smoking status: Never Smoker   . Smokeless tobacco: None  . Alcohol Use: No    Review of Systems  Cardiovascular: Negative for chest pain.  Skin: Positive for wound.  Neurological: Negative for weakness.  Hematological: Negative for adenopathy.      Allergies  Codeine  Home Medications   Prior to Admission medications   Medication Sig Start Date End Date Taking? Authorizing Provider  baclofen (LIORESAL) 10 MG tablet Take 10 mg by mouth 3 (three) times daily.   Yes Historical Provider, MD  finasteride (PROSCAR) 5 MG tablet Take 5 mg by mouth daily.   Yes Historical Provider, MD  lisinopril (PRINIVIL,ZESTRIL) 40 MG tablet Take 40 mg by mouth daily.   Yes Historical Provider, MD  Oxcarbazepine (TRILEPTAL) 300 MG tablet Take 300 mg by mouth 2 (two) times daily.   Yes Historical Provider, MD  rivaroxaban (XARELTO) 20 MG TABS tablet Take 20 mg by mouth daily with supper.   Yes Historical Provider, MD  tamsulosin (FLOMAX) 0.4 MG CAPS capsule Take 0.4 mg by mouth daily.   Yes Historical Provider, MD   BP 151/86 mmHg  Pulse 54  Temp(Src) 98.5 F (36.9 C) (Oral)  Resp 20  SpO2 99% Physical Exam  Constitutional: He appears well-developed.    Cardiovascular: Normal rate.   Musculoskeletal: He exhibits tenderness.  Injury to the distal phalanx of left ring finger. Exposed nail root. There is laceration on the side of the finger also. Some discoloration of distal part of phalanx. Sensation grossly intact.  Neurological: He is alert.  Skin: Skin is warm.        ED Course  Procedures (including critical care time) Labs Review Labs Reviewed - No data to display  Imaging Review Dg Hand Complete Left  02/04/2015  CLINICAL DATA:  Left hand caught in wood press today. Distal left ring finger injury. EXAM: LEFT HAND - COMPLETE 3+ VIEW COMPARISON:  None. FINDINGS: There is comminuted fracture through the tip of the left ring finger distal phalanx. Fracture fragments are displaced. No additional acute bony abnormality. IMPRESSION: Comminuted fracture through the tip of the distal phalanx left ring finger. Electronically Signed   By: Rolm Baptise M.D.   On: 02/04/2015 14:12   I have personally reviewed and evaluated these images and lab results as part of my medical decision-making.   EKG Interpretation None      MDM   Final diagnoses:  Crushed finger, distal, initial encounter    Patient with crush injury of finger. Open fracture with nailbed injury. To be seen by Dr. Amedeo Plenty with hand surgery    Davonna Belling, MD 02/04/15 [id number]

## 2015-02-04 NOTE — Discharge Instructions (Signed)
Crush Injury, Fingers or Toes °A crush injury to the fingers or toes means the tissues have been damaged by being squeezed (compressed). There will be bleeding into the tissues and swelling. Often, blood will collect under the skin. When this happens, the skin on the finger often dies and may slough off (shed) 1 week to 10 days later. Usually, new skin is growing underneath. If the injury has been too severe and the tissue does not survive, the damaged tissue may begin to turn black over several days.  °Wounds which occur because of the crushing may be stitched (sutured) shut. However, crush injuries are more likely to become infected than other injuries. These wounds may not be closed as tightly as other types of cuts to prevent infection. Nails involved are often lost. These usually grow back over several weeks.  °DIAGNOSIS °X-rays may be taken to see if there is any injury to the bones. °TREATMENT °Broken bones (fractures) may be treated with splinting, depending on the fracture. Often, no treatment is required for fractures of the last bone in the fingers or toes. °HOME CARE INSTRUCTIONS  °· The crushed part should be raised (elevated) above the heart or center of the chest as much as possible for the first several days or as directed. This helps with pain and lessens swelling. Less swelling increases the chances that the crushed part will survive. °· Put ice on the injured area. °¨ Put ice in a plastic bag. °¨ Place a towel between your skin and the bag. °¨ Leave the ice on for 15-20 minutes, 03-04 times a day for the first 2 days. °· Only take over-the-counter or prescription medicines for pain, discomfort, or fever as directed by your caregiver. °· Use your injured part only as directed. °· Change your bandages (dressings) as directed. °· Keep all follow-up appointments as directed by your caregiver. Not keeping your appointment could result in a chronic or permanent injury, pain, and disability. If there is  any problem keeping the appointment, you must call to reschedule. °SEEK IMMEDIATE MEDICAL CARE IF:  °· There is redness, swelling, or increasing pain in the wound area. °· Pus is coming from the wound. °· You have a fever. °· You notice a bad smell coming from the wound or dressing. °· The edges of the wound do not stay together after the sutures have been removed. °· You are unable to move the injured finger or toe. °MAKE SURE YOU:  °· Understand these instructions. °· Will watch your condition. °· Will get help right away if you are not doing well or get worse. °  °This information is not intended to replace advice given to you by your health care provider. Make sure you discuss any questions you have with your health care provider. °  °Document Released: 03/22/2005 Document Revised: 06/14/2011 Document Reviewed: 08/07/2010 °Elsevier Interactive Patient Education ©2016 [employer] ° °

## 2015-02-05 NOTE — H&P (Signed)
NAMETYMEL, CONELY NO.:  [id number]  MEDICAL RECORD NO.:  [id number]  LOCATION:  A10C                         FACILITY:  [facility]  PHYSICIAN:  Satira Anis. Maida Widger, M.D.DATE OF BIRTH:  1945-08-08  DATE OF ADMISSION:  02/04/2015 DATE OF DISCHARGE:  02/04/2015                             HISTORY & PHYSICAL   HISTORY OF PRESENT ILLNESS:  I had the pleasure to see Nalin Mazzocco today in [facility] Emergency Room upon the kind referral from emergency room staff, Dr. Davonna Belling.  Savino is very pleasant gentleman who I actually know from the distant past in regard to prior trauma.  Today, he is being seen for a new traumatic injury to his hand.  The patient was working with a wood splitter and had an injury to his left ring finger.  He has comminuted open fracture with nail bed and nail plate injury.  I reviewed his x-rays, and findings.  PAST MEDICAL HISTORY:  Reviewed at length.  PAST SURGICAL HISTORY:  Reviewed at length in his chart.  ALLERGIES:  The patient's allergies include codeine.  The patient has a history of hypertension, DVT.  He is a nonsmoker.  The patient notes no prior injury to the finger.  Past medical history, surgical history had been reviewed at length.  His x-rays and exam reviewed at length.  X-ray show a comminuted distal phalanx fracture.  PHYSICAL EXAMINATION:  GENERAL:  Shows a pleasant male, alert, and oriented in no acute distress. VITAL SIGNS:  Stable.  The patient is without signs of excessive bleeding, but certainly does have some significant issue with disarray of the soft tissues.  I reviewed this with him at length and the findings.  He has an open distal phalanx fracture with nail bed and nail plate injury and significant soft tissue disarray. LOWER EXTREMITY:  Benign. CHEST:  Clear. ABDOMEN:  Nontender, nondistended, benign. NECK AND BACK:  Stable.  There is no new acute injury in his neck or back.  IMPRESSION:   Open left ring finger fracture with nail bed and nail plate injury secondary to wood splitter injury.  PLAN:  He was consented for I and D and repair as necessary.  The patient was taken to operative theatre, underwent intermetacarpal block by myself with Sensorcaine and lidocaine.  Following this, prepped and draped with 2 separate Betadine scrub and paints followed by isolation of sterile field.  At this time, he underwent nail plate removal.  The nail plate was removed without difficulty.  There were no complicating features.  This allowed for access to the open fracture.  I irrigated with 2.5 to 3 L of saline.  This was an excisional debridement of an open fracture. Following this, we performed open reduction of the fracture with setting technique.  Following this, we then performed nail bed repair with 6-0 chromic suture.  Following this, I repaired a small laceration less than a centimeter in his pulp after debridement and his lateral nail fold was repaired as well.  He tolerated this well.  Refill was excellent.  At the conclusion of the case, he had a large amount of comminuted area of the distal phalanx, but  the soft-tissue attachments were stable enough.  I felt that healing with some fibrous union and keeping the bony fragments would be the best course of action as a large amount of the dorsal fracture was attached to the nail bed.  He tolerated this all very well.  I wrote for Cipro 500 mg b.i.d. x14 days and Percocet p.r.n. pain which he has tolerated in the past.  Vitamin C 1000 mg a day.  Peri-Colace and our standard postop algorithm will be adhered too.  We will see him back in a week with therapy immediately following.  Kc understands the instructions.  We went through this with he and his son, Jeramie junior at [employer].  Pleasure seeing today and participate in his care.  All questions have been encouraged and answered.  Should any problems arise, we will  be immediately available off course.     Satira Anis. Amedeo Plenty, M.D.     [facility]  D:  02/04/2015  T:  02/05/2015  Job:  [id number]

## 2015-02-17 ENCOUNTER — Other Ambulatory Visit: Payer: Self-pay | Admitting: Interventional Radiology

## 2015-02-17 DIAGNOSIS — I871 Compression of vein: Secondary | ICD-10-CM

## 2015-02-18 ENCOUNTER — Ambulatory Visit
Admission: RE | Admit: 2015-02-18 | Discharge: 2015-02-18 | Disposition: A | Discharge status: Home / Self Care | Payer: Medicare Other | Source: Ambulatory Visit | Attending: Interventional Radiology | Admitting: Interventional Radiology

## 2015-02-18 DIAGNOSIS — I871 Compression of vein: Secondary | ICD-10-CM
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
## 2015-03-01 ENCOUNTER — Other Ambulatory Visit: Payer: Self-pay | Admitting: Family Medicine

## 2015-03-03 NOTE — Telephone Encounter (Signed)
Received refill request electronically Last refill 11/29/14 #30/2 Last office visit 11/25/14 Is it okay to refill?

## 2015-03-03 NOTE — Telephone Encounter (Signed)
Sent. Thanks.   

## 2015-03-04 ENCOUNTER — Other Ambulatory Visit: Payer: Self-pay | Admitting: Neurology

## 2015-03-04 NOTE — Telephone Encounter (Signed)
Last OV: 12/23/14 Next OV: 04/03/15

## 2015-03-08 ENCOUNTER — Other Ambulatory Visit: Payer: Self-pay | Admitting: Family Medicine

## 2015-04-03 ENCOUNTER — Encounter: Payer: Self-pay | Admitting: Neurology

## 2015-04-03 ENCOUNTER — Ambulatory Visit (INDEPENDENT_AMBULATORY_CARE_PROVIDER_SITE_OTHER): Payer: Medicare Other | Admitting: Neurology

## 2015-04-03 VITALS — BP 130/80 | HR 62 | Wt 186.1 lb

## 2015-04-03 DIAGNOSIS — G500 Trigeminal neuralgia: Secondary | ICD-10-CM

## 2015-04-03 MED ORDER — BACLOFEN 20 MG PO TABS: 20.0000 mg | ORAL_TABLET | Freq: Three times a day (TID) | ORAL | Status: DC

## 2015-04-03 MED ORDER — OXCARBAZEPINE 300 MG PO TABS: 900.0000 mg | ORAL_TABLET | Freq: Two times a day (BID) | ORAL | Status: DC

## 2015-04-03 NOTE — Progress Notes (Addendum)
Referral faxed to [place]. Scheduled with Dr. Christella Noa for 04/15/15 @ 2 p.m.
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
## 2015-04-03 NOTE — Patient Instructions (Signed)
1.  Continue oxcarbazepine 900mg  twice daily 2.  We will increase baclofen to 20mg  three times daily 3.  We will refer you to either Dr. Sherwood Gambler or [facility] for possible gamma knife 4.  Follow up in 3 months.

## 2015-04-16 ENCOUNTER — Other Ambulatory Visit: Payer: Self-pay | Admitting: Neurosurgery

## 2015-04-17 ENCOUNTER — Other Ambulatory Visit (HOSPITAL_COMMUNITY): Payer: Self-pay | Admitting: Interventional Radiology

## 2015-04-17 DIAGNOSIS — I82422 Acute embolism and thrombosis of left iliac vein: Secondary | ICD-10-CM

## 2015-04-22 ENCOUNTER — Other Ambulatory Visit: Payer: Self-pay | Admitting: Radiology

## 2015-04-23 ENCOUNTER — Other Ambulatory Visit: Payer: Self-pay | Admitting: Physician Assistant

## 2015-04-24 ENCOUNTER — Other Ambulatory Visit (HOSPITAL_COMMUNITY): Payer: Self-pay | Admitting: Interventional Radiology

## 2015-04-24 ENCOUNTER — Encounter (HOSPITAL_COMMUNITY): Payer: Self-pay

## 2015-04-24 ENCOUNTER — Other Ambulatory Visit: Payer: Self-pay | Admitting: Urology

## 2015-04-24 ENCOUNTER — Ambulatory Visit (HOSPITAL_COMMUNITY)
Admission: RE | Admit: 2015-04-24 | Discharge: 2015-04-24 | Disposition: A | Discharge status: Home / Self Care | Payer: Medicare Other | Source: Ambulatory Visit | Attending: Interventional Radiology | Admitting: Interventional Radiology

## 2015-04-24 DIAGNOSIS — I871 Compression of vein: Secondary | ICD-10-CM | POA: Diagnosis present

## 2015-04-24 DIAGNOSIS — I10 Essential (primary) hypertension: Secondary | ICD-10-CM | POA: Insufficient documentation

## 2015-04-24 DIAGNOSIS — Z87891 Personal history of nicotine dependence: Secondary | ICD-10-CM | POA: Insufficient documentation

## 2015-04-24 DIAGNOSIS — I82422 Acute embolism and thrombosis of left iliac vein: Secondary | ICD-10-CM

## 2015-04-24 DIAGNOSIS — Z7901 Long term (current) use of anticoagulants: Secondary | ICD-10-CM | POA: Diagnosis not present

## 2015-04-24 DIAGNOSIS — K219 Gastro-esophageal reflux disease without esophagitis: Secondary | ICD-10-CM | POA: Insufficient documentation

## 2015-04-24 DIAGNOSIS — Z8781 Personal history of (healed) traumatic fracture: Secondary | ICD-10-CM | POA: Insufficient documentation

## 2015-04-24 DIAGNOSIS — Z86718 Personal history of other venous thrombosis and embolism: Secondary | ICD-10-CM | POA: Insufficient documentation

## 2015-04-24 DIAGNOSIS — Z85828 Personal history of other malignant neoplasm of skin: Secondary | ICD-10-CM | POA: Insufficient documentation

## 2015-04-24 DIAGNOSIS — Z8249 Family history of ischemic heart disease and other diseases of the circulatory system: Secondary | ICD-10-CM | POA: Insufficient documentation

## 2015-04-24 DIAGNOSIS — G500 Trigeminal neuralgia: Secondary | ICD-10-CM | POA: Insufficient documentation

## 2015-04-24 DIAGNOSIS — I82429 Acute embolism and thrombosis of unspecified iliac vein: Secondary | ICD-10-CM | POA: Insufficient documentation

## 2015-04-24 LAB — CBC
HCT: 43 % (ref 39.0–52.0)
Hemoglobin: 14.7 g/dL (ref 13.0–17.0)
MCH: 29.2 pg (ref 26.0–34.0)
MCHC: 34.2 g/dL (ref 30.0–36.0)
MCV: 85.3 fL (ref 78.0–100.0)
PLATELETS: 169 10*3/uL (ref 150–400)
RBC: 5.04 MIL/uL (ref 4.22–5.81)
RDW: 12.7 % (ref 11.5–15.5)
WBC: 3.7 10*3/uL — ABNORMAL LOW (ref 4.0–10.5)

## 2015-04-24 LAB — BASIC METABOLIC PANEL
ANION GAP: 7 (ref 5–15)
BUN: 11 mg/dL (ref 6–20)
CALCIUM: 9.2 mg/dL (ref 8.9–10.3)
CHLORIDE: 101 mmol/L (ref 101–111)
CO2: 27 mmol/L (ref 22–32)
CREATININE: 0.89 mg/dL (ref 0.61–1.24)
GFR calc Af Amer: >60 mL/min (ref >60)
GFR calc non Af Amer: >60 mL/min (ref >60)
GLUCOSE: 99 mg/dL (ref 65–99)
POTASSIUM: 4.2 mmol/L (ref 3.5–5.1)
SODIUM: 135 mmol/L (ref 135–145)

## 2015-04-24 LAB — APTT: APTT: 28 s (ref 24–37)

## 2015-04-24 LAB — PROTIME-INR
INR: 1.11 (ref 0.00–1.49)
PROTHROMBIN TIME: 14.5 s (ref 11.6–15.2)

## 2015-04-24 MED ORDER — LIDOCAINE HCL 1 % IJ SOLN
INTRAMUSCULAR | Status: AC
  Filled 2015-04-24: qty 20

## 2015-04-24 MED ORDER — FENTANYL CITRATE (PF) 100 MCG/2ML IJ SOLN
INTRAMUSCULAR | Status: AC | PRN
  Administered 2015-04-24: 50 ug via INTRAVENOUS
  Administered 2015-04-24 (×4): 25 ug via INTRAVENOUS

## 2015-04-24 MED ORDER — SODIUM CHLORIDE 0.9 % IV SOLN
INTRAVENOUS | Status: AC | PRN
  Administered 2015-04-24: 10 mL/h via INTRAVENOUS

## 2015-04-24 MED ORDER — FENTANYL CITRATE (PF) 100 MCG/2ML IJ SOLN
INTRAMUSCULAR | Status: AC
  Filled 2015-04-24: qty 4

## 2015-04-24 MED ORDER — IOHEXOL 300 MG/ML  SOLN
150.0000 mL | Freq: Once | INTRAMUSCULAR | Status: AC | PRN
  Administered 2015-04-24: 75 mL via INTRAVENOUS

## 2015-04-24 MED ORDER — MIDAZOLAM HCL 2 MG/2ML IJ SOLN
INTRAMUSCULAR | Status: AC | PRN
  Administered 2015-04-24 (×4): 0.5 mg via INTRAVENOUS
  Administered 2015-04-24: 1 mg via INTRAVENOUS

## 2015-04-24 MED ORDER — MIDAZOLAM HCL 2 MG/2ML IJ SOLN
INTRAMUSCULAR | Status: AC
  Filled 2015-04-24: qty 4

## 2015-04-24 MED ORDER — HEPARIN SODIUM (PORCINE) 1000 UNIT/ML IJ SOLN
INTRAMUSCULAR | Status: AC
  Filled 2015-04-24: qty 1

## 2015-04-24 MED ORDER — SODIUM CHLORIDE 0.9 % IV SOLN: Freq: Once | INTRAVENOUS | Status: DC

## 2015-04-24 NOTE — Sedation Documentation (Signed)
Patient is resting comfortably. 

## 2015-04-24 NOTE — Sedation Documentation (Signed)
Patient is resting comfortably. Does feel pressure when balloon angioplasty inflated.

## 2015-04-24 NOTE — Sedation Documentation (Signed)
Patient is resting comfortably. Does have pressure when balloon inflated.

## 2015-04-24 NOTE — Sedation Documentation (Signed)
Patient is resting comfortably. Aware procedure done and stent in.
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
## 2015-04-24 NOTE — Sedation Documentation (Signed)
02 d/c 

## 2015-04-24 NOTE — Sedation Documentation (Addendum)
About to perform angioplasty will give more pain meds. Otherwise resting comfortably.

## 2015-04-24 NOTE — Sedation Documentation (Signed)
Dr Earleen Newport aware heart rate 48-52.

## 2015-04-24 NOTE — Sedation Documentation (Signed)
Report to [place].

## 2015-04-24 NOTE — Discharge Instructions (Signed)
Venogram °A venogram, or venography, is a procedure to look at the veins using X-ray and dye (contrast). Contrast helps the veins show up on X-rays. A venography evaluates vein abnormalities, identifies clots within veins such as deep vein thrombosis (DVT), and evaluates swelling in an arm or leg. °LET YOUR HEALTH CARE PROVIDER KNOW ABOUT:  °· Any allergies you have, especially to medicines, shellfish, iodine, and contrast. °· All medicines you are taking, including vitamins, herbs, eye drops, creams, and over-the-counter medicines. °· Any previous complications from this or other procedures. °· Any smoking history. °· Any possibility of pregnancy. °· Any blood disorders you have. °· Previous surgeries you have had. °· Medical conditions you have. °RISKS AND COMPLICATIONS  °Generally, this is a safe procedure. However, as with any procedure, problems can occur. Possible problems include: °· Blood clots. °· Kidney problems. °· Allergic reaction to the contrast. °· Bleeding. °· Infection. °· X-ray exposure. °BEFORE THE PROCEDURE  °· Ask your health care provider about changing or stopping your regular medicines. This is especially important if you are taking diabetes medicines or blood thinners. °· Your health care provider may want you to have blood tests. These tests can help tell how well your kidneys and liver are working. They can also show how well your blood clots.   °· Do not eat or drink anything after midnight on the night before the procedure or as directed by your health care provider. °· A blood sample may be drawn. °· An IV tube will be placed into a vein in your arm or leg. °· You will be asked to remove your clothing and put on a hospital gown. °· You may be asked to remove your watch or jewelry. °· Arrange for someone to drive you home after the procedure. If you receive pain medicine or sedatives during the procedure, it will be unsafe for you to drive for a few hours after the procedure. °PROCEDURE    °· You will have an IV tube placed in a vein. This is where your health care provider will inject the dye. You may also be given medicine through the IV tube to help you relax (sedation). °· During the exam, you will lie on an X-ray table. The table may be tilted in different directions during the procedure to help the dye move throughout your body. Safety straps will keep you secure if the table is tilted. °· If the veins to be evaluated are in your arm or leg, a band may be wrapped around that arm or leg to make the veins stay full of blood. You may feel like your arm or leg is going to sleep. °· When the contrast is injected into your vein, you may notice a hot, flushed feeling as it moves throughout your body. You may also notice a metallic taste in your mouth. Both of these sensations will go away after the test is complete. °· You may be asked to lie in different positions or place your extremity in different positions. °· At the end of the procedure, you will be given extra IV fluids to help flush the contrast material from your veins. °· When the IV tube is removed, pressure will be applied to prevent bleeding. A bandage may be applied. °AFTER THE PROCEDURE  °· You will be checked frequently after the procedure. You may feel sleepy during this time if you were given a sedation medicine during the procedure. °· You may be given something to eat and drink. °·   You will be instructed to drink a lot of fluids for the remainder of the day and into the next day, to help flush the contrast from your body.   This information is not intended to replace advice given to you by your health care provider. Make sure you discuss any questions you have with your health care provider.   Document Released: 03/10/2009 Document Revised: 04/12/2014 Document Reviewed: 11/27/2012 Elsevier Interactive Patient Education 2016 Elsevier Inc.   Recommendations:  - observe 1 hour post op - 81mg  ASA daily indefinite.  -  continue on anti-coagulation until follow up with hematologist. VIR would suggest at least 1 month.  - Ok to shower tomorrow - Do not submerge for 7 days - Routine care  - Return for 4-6 week follow up with Dr. Earleen Newport at [facility] clinic [phone] with LLE DVT study.

## 2015-04-24 NOTE — Sedation Documentation (Signed)
Sheath pulled per [employer]. Pressure being held.

## 2015-04-24 NOTE — Procedures (Signed)
Interventional Radiology Procedure Note  Procedure: Left femoral vein access, left LE venogram, reconstruction of left iliac system occlusion with 40mm x 9cm wallstent. Manual pressure  Findings:  Occlusion of CIV & EIV.  Multiple collateral veins for drainage.  SP reconstruction, flow through the stent system and no appreciable collateral venous drainage.     Complications: None Recommendations:  - observe 1 hour post op - 81mg  ASA daily indefinite.  - continue on anti-coagulation until follow up with hematologist.  VIR would suggest at least 1 month.  - Ok to shower tomorrow - Do not submerge for 7 days - Routine care  - Return for 4-6 week follow up with Dr. Earleen Newport at [facility] clinic [phone] with LLE DVT study.   Signed,  Dulcy Fanny. Earleen Newport, DO

## 2015-04-25 ENCOUNTER — Telehealth: Payer: Self-pay | Admitting: Radiology

## 2015-04-25 ENCOUNTER — Encounter (HOSPITAL_COMMUNITY): Payer: Self-pay

## 2015-04-25 ENCOUNTER — Other Ambulatory Visit: Payer: Self-pay | Admitting: Interventional Radiology

## 2015-04-25 DIAGNOSIS — I82422 Acute embolism and thrombosis of left iliac vein: Secondary | ICD-10-CM

## 2015-04-25 DIAGNOSIS — I871 Compression of vein: Secondary | ICD-10-CM

## 2015-04-25 NOTE — Progress Notes (Signed)
   Pt calls clinic today complaining of some Left flank pain  Post procedure 04/24/15: IMPRESSION: Status post left lower extremity venogram with balloon angioplasty and bare metal stent reconstruction of occluded left iliac system, improving venous outflow and resolving pelvic venous collaterals.  Dr Earleen Newport requesting Rx given pt for same Rx written for Percocet 5/325 mg #20 Pt to pick up from [facility] contacting pt

## 2015-04-29 NOTE — Pre-Procedure Instructions (Signed)
    SLADE ROEPKE  04/29/2015      [facility] [place], [place] - [address] [place] [address] Phone: [phone] Fax: [phone]    Your procedure is scheduled on Wednesday, February 1st, 2017.  Report to [facility] Admitting at 7:00 A.M.  Call this number if you have problems the morning of surgery:  [phone]   Remember:  Do not eat food or drink liquids after midnight.   Take these medicines the morning of surgery with A SIP OF WATER: Baclofen (Lioresal), Finasteride (Proscar),  Oxcarbazepine (Trileptal), Oxycodone-acetaminophen (Percocet) if needed, eye drops if needed.   Stop taking: Xarelto, Aspirin, NSAIDS, Aleve, Naproxen, Ibuprofen, Advil, Motrin, BC's, Goody's, Fish oil, all herbal medications, and all vitamins.     Do not wear jewelry, make-up or nail polish.  Do not wear lotions, powders, or perfumes.  You may wear deodorant.  Do not shave 48 hours prior to surgery.  Men may shave face and neck.  Do not bring valuables to the hospital.  [facility] is not responsible for any belongings or valuables.  Contacts, dentures or bridgework may not be worn into surgery.  Leave your suitcase in the car.  After surgery it may be brought to your room.  For patients admitted to the hospital, discharge time will be determined by your treatment team.  Patients discharged the day of surgery will not be allowed to drive home.   Special instructions:  See attached.   Please read over the following fact sheets that you were given. Coughing and Deep Breathing, Pain booklet, Surgical Site Infection, MRSA, Shower instructions

## 2015-04-30 ENCOUNTER — Encounter (HOSPITAL_COMMUNITY)
Admission: RE | Admit: 2015-04-30 | Discharge: 2015-04-30 | Disposition: A | Discharge status: Home / Self Care | Payer: Medicare Other | Source: Ambulatory Visit | Attending: Neurosurgery | Admitting: Neurosurgery

## 2015-04-30 ENCOUNTER — Encounter (HOSPITAL_COMMUNITY): Payer: Self-pay | Admitting: *Deleted

## 2015-04-30 ENCOUNTER — Encounter (HOSPITAL_COMMUNITY): Payer: Self-pay

## 2015-04-30 ENCOUNTER — Emergency Department (HOSPITAL_COMMUNITY)
Admission: EM | Admit: 2015-04-30 | Discharge: 2015-05-01 | Disposition: A | Discharge status: Home / Self Care | Payer: Medicare Other | Attending: Emergency Medicine | Admitting: Emergency Medicine

## 2015-04-30 DIAGNOSIS — Z8669 Personal history of other diseases of the nervous system and sense organs: Secondary | ICD-10-CM | POA: Diagnosis not present

## 2015-04-30 DIAGNOSIS — Z79899 Other long term (current) drug therapy: Secondary | ICD-10-CM | POA: Insufficient documentation

## 2015-04-30 DIAGNOSIS — Z87828 Personal history of other (healed) physical injury and trauma: Secondary | ICD-10-CM | POA: Diagnosis not present

## 2015-04-30 DIAGNOSIS — R630 Anorexia: Secondary | ICD-10-CM | POA: Insufficient documentation

## 2015-04-30 DIAGNOSIS — R1030 Lower abdominal pain, unspecified: Secondary | ICD-10-CM

## 2015-04-30 DIAGNOSIS — Z86718 Personal history of other venous thrombosis and embolism: Secondary | ICD-10-CM | POA: Insufficient documentation

## 2015-04-30 DIAGNOSIS — Z85828 Personal history of other malignant neoplasm of skin: Secondary | ICD-10-CM | POA: Insufficient documentation

## 2015-04-30 DIAGNOSIS — Z95820 Peripheral vascular angioplasty status with implants and grafts: Secondary | ICD-10-CM | POA: Insufficient documentation

## 2015-04-30 DIAGNOSIS — R001 Bradycardia, unspecified: Secondary | ICD-10-CM | POA: Insufficient documentation

## 2015-04-30 DIAGNOSIS — R5383 Other fatigue: Secondary | ICD-10-CM | POA: Diagnosis not present

## 2015-04-30 DIAGNOSIS — Z01818 Encounter for other preprocedural examination: Secondary | ICD-10-CM | POA: Insufficient documentation

## 2015-04-30 DIAGNOSIS — Z87891 Personal history of nicotine dependence: Secondary | ICD-10-CM | POA: Diagnosis not present

## 2015-04-30 DIAGNOSIS — R42 Dizziness and giddiness: Secondary | ICD-10-CM | POA: Insufficient documentation

## 2015-04-30 DIAGNOSIS — R110 Nausea: Secondary | ICD-10-CM | POA: Diagnosis not present

## 2015-04-30 DIAGNOSIS — I10 Essential (primary) hypertension: Secondary | ICD-10-CM | POA: Insufficient documentation

## 2015-04-30 DIAGNOSIS — K219 Gastro-esophageal reflux disease without esophagitis: Secondary | ICD-10-CM | POA: Insufficient documentation

## 2015-04-30 DIAGNOSIS — R1033 Periumbilical pain: Secondary | ICD-10-CM | POA: Diagnosis present

## 2015-04-30 DIAGNOSIS — Z8781 Personal history of (healed) traumatic fracture: Secondary | ICD-10-CM | POA: Insufficient documentation

## 2015-04-30 DIAGNOSIS — G500 Trigeminal neuralgia: Secondary | ICD-10-CM | POA: Insufficient documentation

## 2015-04-30 DIAGNOSIS — Z01812 Encounter for preprocedural laboratory examination: Secondary | ICD-10-CM | POA: Diagnosis not present

## 2015-04-30 DIAGNOSIS — Z8719 Personal history of other diseases of the digestive system: Secondary | ICD-10-CM | POA: Diagnosis not present

## 2015-04-30 DIAGNOSIS — Z7901 Long term (current) use of anticoagulants: Secondary | ICD-10-CM | POA: Diagnosis not present

## 2015-04-30 HISTORY — DX: Nausea with vomiting, unspecified: Z98.890

## 2015-04-30 HISTORY — DX: Presence of spectacles and contact lenses: Z97.3

## 2015-04-30 HISTORY — DX: Nausea with vomiting, unspecified: R11.2

## 2015-04-30 LAB — SURGICAL PCR SCREEN
MRSA, PCR: NEGATIVE
Staphylococcus aureus: NEGATIVE

## 2015-04-30 LAB — CBC
HEMATOCRIT: 38.5 % — AB (ref 39.0–52.0)
HEMOGLOBIN: 13.9 g/dL (ref 13.0–17.0)
MCH: 30.3 pg (ref 26.0–34.0)
MCHC: 36.1 g/dL — AB (ref 30.0–36.0)
MCV: 83.9 fL (ref 78.0–100.0)
Platelets: 188 10*3/uL (ref 150–400)
RBC: 4.59 MIL/uL (ref 4.22–5.81)
RDW: 12.3 % (ref 11.5–15.5)
WBC: 5.6 10*3/uL (ref 4.0–10.5)

## 2015-04-30 LAB — COMPREHENSIVE METABOLIC PANEL
ALBUMIN: 3.8 g/dL (ref 3.5–5.0)
ALK PHOS: 67 U/L (ref 38–126)
ALT: 18 U/L (ref 17–63)
ANION GAP: 11 (ref 5–15)
AST: 18 U/L (ref 15–41)
BUN: 10 mg/dL (ref 6–20)
CALCIUM: 9.4 mg/dL (ref 8.9–10.3)
CHLORIDE: 99 mmol/L — AB (ref 101–111)
CO2: 24 mmol/L (ref 22–32)
Creatinine, Ser: 0.92 mg/dL (ref 0.61–1.24)
GFR calc Af Amer: >60 mL/min (ref >60)
GFR calc non Af Amer: >60 mL/min (ref >60)
GLUCOSE: 123 mg/dL — AB (ref 65–99)
POTASSIUM: 4.4 mmol/L (ref 3.5–5.1)
SODIUM: 134 mmol/L — AB (ref 135–145)
Total Bilirubin: 0.8 mg/dL (ref 0.3–1.2)
Total Protein: 5.8 g/dL — ABNORMAL LOW (ref 6.5–8.1)

## 2015-04-30 LAB — PROTIME-INR
INR: 1.11 (ref 0.00–1.49)
Prothrombin Time: 14.5 seconds (ref 11.6–15.2)

## 2015-04-30 LAB — LIPASE, BLOOD: LIPASE: 25 U/L (ref 11–51)

## 2015-04-30 MED ORDER — ONDANSETRON 4 MG PO TBDP
8.0000 mg | ORAL_TABLET | Freq: Once | ORAL | Status: AC
  Administered 2015-04-30: 8 mg via ORAL
  Filled 2015-04-30: qty 2

## 2015-04-30 MED ORDER — METOCLOPRAMIDE HCL 5 MG/ML IJ SOLN
10.0000 mg | Freq: Once | INTRAMUSCULAR | Status: AC
  Administered 2015-04-30: 10 mg via INTRAVENOUS
  Filled 2015-04-30: qty 2

## 2015-04-30 MED ORDER — SODIUM CHLORIDE 0.9 % IV BOLUS (SEPSIS)
1000.0000 mL | Freq: Once | INTRAVENOUS | Status: AC
  Administered 2015-04-30: 1000 mL via INTRAVENOUS

## 2015-04-30 MED ORDER — DICYCLOMINE HCL 10 MG PO CAPS
20.0000 mg | ORAL_CAPSULE | Freq: Once | ORAL | Status: AC
Start: 2015-04-30 — End: 2015-04-30
  Administered 2015-04-30: 20 mg via ORAL
  Filled 2015-04-30: qty 2
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
## 2015-04-30 NOTE — ED Notes (Signed)
He was  Seen this am for pre-op  Nerve and jaw surgery soon

## 2015-04-30 NOTE — ED Notes (Signed)
The pt has not felt well for one week.  He has had nausea aND VOMITING SINCE HE HAD A SURGICAL PROCEDURE ON [facility] Thursday  No meds taken

## 2015-04-30 NOTE — Progress Notes (Signed)
PCP - Dr. Elsie Stain Cardiologist - denies  EKG - 04/30/15 CXR - denies  Echo/stress test/cardiac cath - denies  Patient denies chest pain and shortness of breath at PAT appointment.  Patient states that he took his last dose of Xarelto on 04/29/15 but had not taken it in approximately a week prior to that due to previous procedure.   Patient had a venogram with stent placement in LLE on 04/24/15.

## 2015-05-01 ENCOUNTER — Encounter (HOSPITAL_COMMUNITY): Payer: Self-pay | Admitting: Radiology

## 2015-05-01 ENCOUNTER — Emergency Department (HOSPITAL_COMMUNITY): Payer: Medicare Other

## 2015-05-01 LAB — URINALYSIS, ROUTINE W REFLEX MICROSCOPIC
Bilirubin Urine: NEGATIVE
GLUCOSE, UA: NEGATIVE mg/dL
Hgb urine dipstick: NEGATIVE
KETONES UR: NEGATIVE mg/dL
NITRITE: NEGATIVE
PH: 7.5 (ref 5.0–8.0)
Protein, ur: NEGATIVE mg/dL
SPECIFIC GRAVITY, URINE: 1.017 (ref 1.005–1.030)

## 2015-05-01 LAB — URINE MICROSCOPIC-ADD ON

## 2015-05-01 MED ORDER — TRAMADOL HCL 50 MG PO TABS: 50.0000 mg | ORAL_TABLET | Freq: Two times a day (BID) | ORAL | Status: DC | PRN

## 2015-05-01 MED ORDER — IOHEXOL 300 MG/ML  SOLN
100.0000 mL | Freq: Once | INTRAMUSCULAR | Status: AC | PRN
  Administered 2015-05-01: 100 mL via INTRAVENOUS

## 2015-05-01 MED ORDER — METOCLOPRAMIDE HCL 10 MG PO TABS: 10.0000 mg | ORAL_TABLET | Freq: Three times a day (TID) | ORAL | Status: DC | PRN

## 2015-05-01 MED ORDER — PROMETHAZINE HCL 25 MG/ML IJ SOLN
12.5000 mg | Freq: Once | INTRAMUSCULAR | Status: AC
  Administered 2015-05-01: 12.5 mg via INTRAVENOUS
  Filled 2015-05-01: qty 1

## 2015-05-01 NOTE — ED Notes (Signed)
Pt back from CT

## 2015-05-01 NOTE — Discharge Instructions (Signed)
Abdominal Pain, Adult Mr. Cost, you CT scan results are below.  See your doctor within 3 days for close follow up.  Take medication for your symptoms as directed.  If symptoms worsen, come back to the ED immediately.  Thank you.   IMPRESSION: 1. Postoperative stranding and fluid along the course of the stent likely remains within normal limits, though would correlate for any clinical evidence of infection. No definite evidence for thrombosis, though evaluation for thrombosis is somewhat suboptimal given the phase of contrast enhancement. 2. Mild right-sided hydronephrosis, with diffuse distention of the right ureter, but no evidence of an obstructing stone. This may reflect the patient's underlying chronic bladder distention. 3. Scattered hypodensities within the liver most likely reflect cysts. Small left renal cyst noted. 4. Mild splenomegaly. 5. Scattered diverticulosis along the proximal sigmoid colon, without evidence of diverticulitis. 6. Bladder significantly distended, with a large diverticulum arising at the dome of the bladder. 7. Mildly enlarged prostate noted, impressing on the base of the bladder. Would correlate with PSA.   Many things can cause belly (abdominal) pain. Most times, the belly pain is not dangerous. Many cases of belly pain can be watched and treated at home. HOME CARE   Do not take medicines that help you go poop (laxatives) unless told to by your doctor.  Only take medicine as told by your doctor.  Eat or drink as told by your doctor. Your doctor will tell you if you should be on a special diet. GET HELP IF:  You do not know what is causing your belly pain.  You have belly pain while you are sick to your stomach (nauseous) or have runny poop (diarrhea).  You have pain while you pee or poop.  Your belly pain wakes you up at night.  You have belly pain that gets worse or better when you eat.  You have belly pain that gets worse when you eat  fatty foods.  You have a fever. GET HELP RIGHT AWAY IF:   The pain does not go away within 2 hours.  You keep throwing up (vomiting).  The pain changes and is only in the right or left part of the belly.  You have bloody or tarry looking poop. MAKE SURE YOU:   Understand these instructions.  Will watch your condition.  Will get help right away if you are not doing well or get worse.   This information is not intended to replace advice given to you by your health care provider. Make sure you discuss any questions you have with your health care provider.   Document Released: 09/08/2007 Document Revised: 04/12/2014 Document Reviewed: 11/29/2012 Elsevier Interactive Patient Education [employer].

## 2015-05-01 NOTE — ED Notes (Signed)
Pt requesting more medicine for nausea, EDP aware

## 2015-05-01 NOTE — ED Notes (Signed)
EDP at bedside  

## 2015-05-01 NOTE — ED Notes (Signed)
Pt to CT

## 2015-05-01 NOTE — Progress Notes (Addendum)
Anesthesia chart review: Patient is a 70 year old male posted for craniectomy for microvascular decompression of trigeminal neuralgia on 05/07/2015 by Dr. Christella Noa.  History includes trigeminal neuralgia, former smoker, postoperative N/V, hypertension, crush injury right foot '01, skin cancer (SCC), large bladder diverticulum, urinary retention (self-catheterizes), GERD. He had a LLE DVT 10/2013 with 11/13/14 duplex showing popliteal vein thickening most consistent with chronic DVT with acute DVT less likely. According to hematology notes, hypercoagulable workup revealed heterozygosity of Factor V Leiden. Additional labs on 06/02/2014 were negative for prothrombin gene mutation, lupus anticoagulant, anticardiolipin antibodies, protein C activity and antigen, protein S activity and antigen, and antithrombin III antigen and activity.He was started on Xarelto. 12/06/14 CT showed right common iliac artery compressing the left common iliac vein. He underwent attempted reconstruction of chronic left iliac vein thrombus 01/14/15 and s/p balloon angioplasty and bare metal stent reconstruction of occluded left iliac system improving venous outflow and resolving pelvic venous collaterals 04/24/15 (Dr. Corrie Mckusick). Dr. Pasty Arch instructions includes continuing ASA 81 mg indefinitely, continue anti-coagulation until follow-up with hematology (VIR would suggest at least one month), and follow-up in 4-6 weeks with Dr. Earleen Newport with LLE DVT study. I don't see that patient is back on Xarelto or ASA. Of note, last night patient present to [facility] ED with emesis and complaints of periumbilical abdominal pain since his left iliac system system. Labs were unremarkable. Stranding and fluid along the course of the stent seen on CT and felt likely WNL, but could not rule out infection. Clinically the ED MD did not feel patient showed signs of acute infection and recommended follow-up in 3 days.   PCP is Dr. Elsie Stain. GU is Dr.  Carolan Clines, last visit 04/23/15. Neurologist is Dr. Metta Clines, last visit 04/03/15. Hematologist [facility]) is Dr. Nolon Stalls, last visit 12/10/14.  Meds include baclofen, finasteride, lisinopril, Reglan, Macrodantin, fish oil, Trileptal, Percocet, Flomax, tramadol, Timoptic ophthalmic. He had been on Xarelto for DVT (stopped prior to IR procedure).   04/30/15 EKG: SB at 54 bpm, possible LAE.   12/12/14 LLE venous duplex: Sonographic survey of the LLE is negative for DVT. Waveform of the left CFV is compatable with more proximal occlusion/stenosis, which is present on a comparison CT.   05/01/15 CT abd/pelvis: IMPRESSION: 1. Postoperative stranding and fluid along the course of the stent likely remains within normal limits, though would correlate for any clinical evidence of infection. No definite evidence for thrombosis, though evaluation for thrombosis is somewhat suboptimal given the phase of contrast enhancement. 2. Mild right-sided hydronephrosis, with diffuse distention of the right ureter, but no evidence of an obstructing stone. This may reflect the patient's underlying chronic bladder distention. 3. Scattered hypodensities within the liver most likely reflect cysts. Small left renal cyst noted. 4. Mild splenomegaly. 5. Scattered diverticulosis along the proximal sigmoid colon, without evidence of diverticulitis. 6. Bladder significantly distended, with a large diverticulum arising at the dome of the bladder. 7. Mildly enlarged prostate noted, impressing on the base of the bladder. Would correlate with PSA.  12/06/14 CT abd/pelvis: IMPRESSION: 1. RIGHT common iliac artery compresses the LEFT common iliac vein. 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 Recommend interventional radiology consultation. 2. 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 3. Prostate  hypertrophy. 4. No evidence malignancy in the abdomen pelvis.  12/06/14 CXR: IMPRESSION: No active cardiopulmonary disease.  Labs from 04/24/15 and 04/30/15 noted.  I spoke  with patient. He is back at home from the ED. He is still feeling a little dizzy and nauseated. Feels a little better, but not much. Is being evaluated at his PCP office first thing tomorrow. In regards to him being off Xarelto and ASA, he says these were stopped due to upcoming surgeries including this one and TURP scheduled from 05/23/15. I advised that there may be some risks of stopping ASA and Xarelto with his recent iliac venous stent, so I would discuss with Dr. Lacy Duverney office if they were aware of his recent procedure. I also asked that patient let Dr. Pasty Arch office know as well. Above reviewed with anesthesiologist Dr. Lissa Hoard who agreed that Dr. Christella Noa will need to touch base with Dr. Earleen Newport to determine if patient can safely remain off ASA/Xarelto or if surgery should be postponed until he has had IR follow-up with duplex. He should also be feeling improved from his nausea, etc prior to proceeding. Will follow-up PCP records once available. I have notified Lorriane Shire at Dr. Lacy Duverney office that Dr. Christella Noa will need to talk with Dr. Earleen Newport and that patient has ED follow-up visit tomorrow at Dr. Josefine Class office.  George Hugh [facility] Short Stay Center/Anesthesiology Phone [phone] 05/01/2015 4:45 PM  Addendum: I received a phone call back from Manuela Schwartz at Dr. Lacy Duverney office. She has spoken with Dr. Earleen Newport who reportedly gave permission for patient to stop ASA for his upcoming surgery but recommended touching base with hematologist Dr. Humberto Seals as well regarding Xarelto. Manuela Schwartz spoke with Dr. Humberto Seals and reportedly was told that since patient has already been off Xarelto that there was nothing further for her to add except that she would plan to see him out-patient following his upcoming surgeries.   George [facility] Short Stay Center/Anesthesiology Phone [phone] 05/05/2015 2:48 PM

## 2015-05-02 ENCOUNTER — Telehealth: Payer: Self-pay

## 2015-05-02 ENCOUNTER — Encounter: Payer: Self-pay | Admitting: Primary Care

## 2015-05-02 ENCOUNTER — Ambulatory Visit (INDEPENDENT_AMBULATORY_CARE_PROVIDER_SITE_OTHER): Payer: Medicare Other | Admitting: Primary Care

## 2015-05-02 VITALS — BP 126/80 | HR 57 | Temp 98.4°F | Ht 72.0 in | Wt 191.8 lb

## 2015-05-02 DIAGNOSIS — R42 Dizziness and giddiness: Secondary | ICD-10-CM | POA: Diagnosis not present

## 2015-05-02 NOTE — Progress Notes (Signed)
Pre visit review using our clinic review tool, if applicable. No additional management support is needed unless otherwise documented below in the visit note. 
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
## 2015-05-02 NOTE — Telephone Encounter (Signed)
Pt left v/m; has upcoming surgery and pt wants to verify it is OK to stop Xarelto for 7 days. Pt request cb.

## 2015-05-02 NOTE — Patient Instructions (Signed)
Try taking Meclizine for dizziness. This may be purchased over the counter. Start by taking 1/2 tablet at a time as this may make you drowsy.   Please notify Dr. Damita Dunnings if your dizziness does not improve in the next week, or becomes worse.   Follow up with the surgeon as scheduled.  It was a pleasure meeting you!

## 2015-05-04 NOTE — Telephone Encounter (Signed)
Noted heterozygosity for Factor V Leiden and a history of recurrent left lower extremity deep venous thrombosis (DVT).  I want to talk to urology about this, re: possible lovenox bridge.  Please get Dr. Gaynelle Arabian on the phone with [facility].  Thanks.

## 2015-05-05 ENCOUNTER — Telehealth: Payer: Self-pay

## 2015-05-05 ENCOUNTER — Ambulatory Visit: Payer: Medicare Other | Admitting: Family Medicine

## 2015-05-05 NOTE — Telephone Encounter (Signed)
Noted. Thanks.  Will await return call.

## 2015-05-05 NOTE — Telephone Encounter (Signed)
Returned phone call left from [facility] left about pt.   Pt to have procedure of a microvascular decompression and Dr. Manuela Neptune office wanted to know if it was safe for pt to not take Xarelto a week before procedure.  Per Manuela Schwartz when speaking with pt he stopped Xarelto some time ago and that pt stated Manuela Schwartz didn't need to call us because he wasn't taking it.  Per Manuela Schwartz pt will be in hospital for 3 days and can resume.  Spoke with our scheduling to see if we can get pt back in for a follow up next week

## 2015-05-05 NOTE — Telephone Encounter (Signed)
Left message asking Dr. Gaynelle Arabian to call your cell phone number.  He is in surgery until 1:00 pm.

## 2015-05-06 MED ORDER — CEFAZOLIN SODIUM-DEXTROSE 2-3 GM-% IV SOLR
2.0000 g | INTRAVENOUS | Status: AC
  Administered 2015-05-07 (×2): 2 g via INTRAVENOUS
  Filled 2015-05-06: qty 50

## 2015-05-07 ENCOUNTER — Encounter (HOSPITAL_COMMUNITY)
Admission: RE | Disposition: A | Discharge status: Home / Self Care | Payer: Self-pay | Source: Ambulatory Visit | Attending: Neurosurgery

## 2015-05-07 ENCOUNTER — Inpatient Hospital Stay (HOSPITAL_COMMUNITY): Payer: Medicare Other | Admitting: Certified Registered Nurse Anesthetist

## 2015-05-07 ENCOUNTER — Inpatient Hospital Stay (HOSPITAL_COMMUNITY)
Admission: RE | Admit: 2015-05-07 | Discharge: 2015-05-13 | DRG: 026 | Disposition: A | Discharge status: Home / Self Care | Payer: Medicare Other | Source: Ambulatory Visit | Attending: Neurosurgery | Admitting: Neurosurgery

## 2015-05-07 ENCOUNTER — Encounter (HOSPITAL_COMMUNITY): Payer: Self-pay | Admitting: General Practice

## 2015-05-07 ENCOUNTER — Inpatient Hospital Stay (HOSPITAL_COMMUNITY): Payer: Medicare Other | Admitting: Vascular Surgery

## 2015-05-07 DIAGNOSIS — K219 Gastro-esophageal reflux disease without esophagitis: Secondary | ICD-10-CM | POA: Diagnosis not present

## 2015-05-07 DIAGNOSIS — D6851 Activated protein C resistance: Secondary | ICD-10-CM | POA: Diagnosis present

## 2015-05-07 DIAGNOSIS — Z86718 Personal history of other venous thrombosis and embolism: Secondary | ICD-10-CM

## 2015-05-07 DIAGNOSIS — G500 Trigeminal neuralgia: Principal | ICD-10-CM | POA: Diagnosis present

## 2015-05-07 DIAGNOSIS — I10 Essential (primary) hypertension: Secondary | ICD-10-CM | POA: Diagnosis not present

## 2015-05-07 DIAGNOSIS — Z9889 Other specified postprocedural states: Secondary | ICD-10-CM

## 2015-05-07 DIAGNOSIS — Z87891 Personal history of nicotine dependence: Secondary | ICD-10-CM | POA: Diagnosis not present

## 2015-05-07 DIAGNOSIS — Z7901 Long term (current) use of anticoagulants: Secondary | ICD-10-CM | POA: Diagnosis not present

## 2015-05-07 DIAGNOSIS — Z79899 Other long term (current) drug therapy: Secondary | ICD-10-CM

## 2015-05-07 HISTORY — PX: CRANIECTOMY: SHX331

## 2015-05-07 SURGERY — CRANIECTOMY FOR TIC DOULOUREUX
Anesthesia: General | Site: Head | Laterality: Left

## 2015-05-07 MED ORDER — PROPOFOL 10 MG/ML IV BOLUS
INTRAVENOUS | Status: AC
  Filled 2015-05-07: qty 20

## 2015-05-07 MED ORDER — SODIUM CHLORIDE 0.9 % IV SOLN
500.0000 mg | Freq: Two times a day (BID) | INTRAVENOUS | Status: DC
  Administered 2015-05-07 – 2015-05-08 (×2): 500 mg via INTRAVENOUS
  Filled 2015-05-07 (×4): qty 5

## 2015-05-07 MED ORDER — TAMSULOSIN HCL 0.4 MG PO CAPS
0.4000 mg | ORAL_CAPSULE | Freq: Every day | ORAL | Status: DC
  Administered 2015-05-08 – 2015-05-13 (×6): 0.4 mg via ORAL
  Filled 2015-05-07 (×6): qty 1

## 2015-05-07 MED ORDER — MIDAZOLAM HCL 2 MG/2ML IJ SOLN
INTRAMUSCULAR | Status: AC
  Filled 2015-05-07: qty 2

## 2015-05-07 MED ORDER — LACTATED RINGERS IV SOLN
INTRAVENOUS | Status: DC
  Administered 2015-05-07 (×2): via INTRAVENOUS

## 2015-05-07 MED ORDER — BACLOFEN 10 MG PO TABS
20.0000 mg | ORAL_TABLET | Freq: Three times a day (TID) | ORAL | Status: DC
  Administered 2015-05-07 – 2015-05-13 (×17): 20 mg via ORAL
  Filled 2015-05-07 (×18): qty 2

## 2015-05-07 MED ORDER — SUCCINYLCHOLINE CHLORIDE 20 MG/ML IJ SOLN
INTRAMUSCULAR | Status: AC
  Filled 2015-05-07: qty 1

## 2015-05-07 MED ORDER — NALOXONE HCL 0.4 MG/ML IJ SOLN: 0.0800 mg | INTRAMUSCULAR | Status: DC | PRN

## 2015-05-07 MED ORDER — FINASTERIDE 5 MG PO TABS
5.0000 mg | ORAL_TABLET | Freq: Every day | ORAL | Status: DC
  Administered 2015-05-08 – 2015-05-13 (×6): 5 mg via ORAL
  Filled 2015-05-07 (×6): qty 1

## 2015-05-07 MED ORDER — ROCURONIUM BROMIDE 50 MG/5ML IV SOLN
INTRAVENOUS | Status: AC
  Filled 2015-05-07: qty 1

## 2015-05-07 MED ORDER — PROMETHAZINE HCL 25 MG/ML IJ SOLN
6.2500 mg | Freq: Once | INTRAMUSCULAR | Status: AC
  Administered 2015-05-07: 6.25 mg via INTRAVENOUS

## 2015-05-07 MED ORDER — LIDOCAINE HCL (CARDIAC) 20 MG/ML IV SOLN
INTRAVENOUS | Status: AC
  Filled 2015-05-07: qty 5

## 2015-05-07 MED ORDER — HYDROMORPHONE HCL 1 MG/ML IJ SOLN
0.2500 mg | INTRAMUSCULAR | Status: DC | PRN
  Administered 2015-05-07: 0.5 mg via INTRAVENOUS

## 2015-05-07 MED ORDER — TIMOLOL MALEATE 0.5 % OP SOLN
1.0000 [drp] | Freq: Two times a day (BID) | OPHTHALMIC | Status: DC
  Administered 2015-05-07 – 2015-05-13 (×12): 1 [drp] via OPHTHALMIC
  Filled 2015-05-07 (×2): qty 5

## 2015-05-07 MED ORDER — EPHEDRINE SULFATE 50 MG/ML IJ SOLN
INTRAMUSCULAR | Status: DC | PRN
  Administered 2015-05-07: 5 mg via INTRAVENOUS
  Administered 2015-05-07: 10 mg via INTRAVENOUS

## 2015-05-07 MED ORDER — SODIUM CHLORIDE 0.9 % IV SOLN
0.0125 ug/kg/min | INTRAVENOUS | Status: DC
  Administered 2015-05-07: .025 ug/kg/min via INTRAVENOUS
  Filled 2015-05-07: qty 2000

## 2015-05-07 MED ORDER — ONDANSETRON HCL 4 MG/2ML IJ SOLN
INTRAMUSCULAR | Status: AC
  Filled 2015-05-07: qty 2

## 2015-05-07 MED ORDER — LABETALOL HCL 5 MG/ML IV SOLN
10.0000 mg | INTRAVENOUS | Status: DC | PRN
  Administered 2015-05-07: 10 mg via INTRAVENOUS
  Filled 2015-05-07: qty 4

## 2015-05-07 MED ORDER — SODIUM CHLORIDE 0.9 % IV SOLN
INTRAVENOUS | Status: DC | PRN
  Administered 2015-05-07: 11:00:00 via INTRAVENOUS

## 2015-05-07 MED ORDER — PROCHLORPERAZINE 25 MG RE SUPP
25.0000 mg | Freq: Two times a day (BID) | RECTAL | Status: DC | PRN
  Administered 2015-05-10: 25 mg via RECTAL
  Filled 2015-05-07 (×4): qty 1

## 2015-05-07 MED ORDER — HYDROMORPHONE HCL 1 MG/ML IJ SOLN
INTRAMUSCULAR | Status: AC
  Filled 2015-05-07: qty 1

## 2015-05-07 MED ORDER — LISINOPRIL 20 MG PO TABS
40.0000 mg | ORAL_TABLET | Freq: Every day | ORAL | Status: DC
  Administered 2015-05-09 – 2015-05-13 (×5): 40 mg via ORAL
  Filled 2015-05-07 (×5): qty 2

## 2015-05-07 MED ORDER — SUGAMMADEX SODIUM 200 MG/2ML IV SOLN
INTRAVENOUS | Status: DC | PRN
  Administered 2015-05-07: 180 mg via INTRAVENOUS

## 2015-05-07 MED ORDER — BISACODYL 5 MG PO TBEC: 5.0000 mg | DELAYED_RELEASE_TABLET | Freq: Every day | ORAL | Status: DC | PRN

## 2015-05-07 MED ORDER — DOCUSATE SODIUM 100 MG PO CAPS
100.0000 mg | ORAL_CAPSULE | Freq: Two times a day (BID) | ORAL | Status: DC
  Administered 2015-05-08 – 2015-05-13 (×11): 100 mg via ORAL
  Filled 2015-05-07 (×12): qty 1

## 2015-05-07 MED ORDER — ONDANSETRON HCL 4 MG/2ML IJ SOLN
4.0000 mg | INTRAMUSCULAR | Status: DC | PRN
  Administered 2015-05-07 – 2015-05-08 (×3): 4 mg via INTRAVENOUS
  Filled 2015-05-07 (×4): qty 2

## 2015-05-07 MED ORDER — PSYLLIUM 95 % PO PACK
1.0000 | PACK | Freq: Every day | ORAL | Status: DC
  Administered 2015-05-09 – 2015-05-12 (×3): 1 via ORAL
  Filled 2015-05-07 (×7): qty 1

## 2015-05-07 MED ORDER — ALBUMIN HUMAN 5 % IV SOLN
INTRAVENOUS | Status: DC | PRN
  Administered 2015-05-07: 11:00:00 via INTRAVENOUS

## 2015-05-07 MED ORDER — POTASSIUM CHLORIDE IN NACL 20-0.9 MEQ/L-% IV SOLN
INTRAVENOUS | Status: DC
  Administered 2015-05-07: 80 mL via INTRAVENOUS
  Administered 2015-05-08: 09:00:00 via INTRAVENOUS
  Filled 2015-05-07 (×3): qty 1000

## 2015-05-07 MED ORDER — MIDAZOLAM HCL 5 MG/5ML IJ SOLN
INTRAMUSCULAR | Status: DC | PRN
  Administered 2015-05-07: 1 mg via INTRAVENOUS

## 2015-05-07 MED ORDER — FENTANYL CITRATE (PF) 100 MCG/2ML IJ SOLN
INTRAMUSCULAR | Status: DC | PRN
  Administered 2015-05-07 (×2): 25 ug via INTRAVENOUS
  Administered 2015-05-07: 150 ug via INTRAVENOUS
  Administered 2015-05-07 (×2): 25 ug via INTRAVENOUS

## 2015-05-07 MED ORDER — LIDOCAINE-EPINEPHRINE 0.5 %-1:200000 IJ SOLN
INTRAMUSCULAR | Status: DC | PRN
  Administered 2015-05-07: 7 mL

## 2015-05-07 MED ORDER — ROCURONIUM BROMIDE 100 MG/10ML IV SOLN
INTRAVENOUS | Status: DC | PRN
  Administered 2015-05-07: 20 mg via INTRAVENOUS
  Administered 2015-05-07: 10 mg via INTRAVENOUS
  Administered 2015-05-07: 20 mg via INTRAVENOUS
  Administered 2015-05-07: 50 mg via INTRAVENOUS
  Administered 2015-05-07: 30 mg via INTRAVENOUS
  Administered 2015-05-07: 20 mg via INTRAVENOUS
  Administered 2015-05-07: 30 mg via INTRAVENOUS
  Administered 2015-05-07 (×2): 20 mg via INTRAVENOUS
  Administered 2015-05-07: 10 mg via INTRAVENOUS
  Administered 2015-05-07: 20 mg via INTRAVENOUS

## 2015-05-07 MED ORDER — MORPHINE SULFATE (PF) 2 MG/ML IV SOLN
1.0000 mg | INTRAVENOUS | Status: DC | PRN
  Administered 2015-05-07 (×2): 1 mg via INTRAVENOUS
  Administered 2015-05-07 – 2015-05-09 (×7): 2 mg via INTRAVENOUS
  Filled 2015-05-07 (×9): qty 1

## 2015-05-07 MED ORDER — SUGAMMADEX SODIUM 200 MG/2ML IV SOLN
INTRAVENOUS | Status: AC
  Filled 2015-05-07: qty 2

## 2015-05-07 MED ORDER — PROPOFOL 10 MG/ML IV BOLUS
INTRAVENOUS | Status: DC | PRN
  Administered 2015-05-07: 150 mg via INTRAVENOUS
  Administered 2015-05-07: 50 mg via INTRAVENOUS

## 2015-05-07 MED ORDER — ASPIRIN EC 81 MG PO TBEC
81.0000 mg | DELAYED_RELEASE_TABLET | Freq: Every day | ORAL | Status: DC
  Administered 2015-05-09 – 2015-05-13 (×5): 81 mg via ORAL
  Filled 2015-05-07 (×6): qty 1

## 2015-05-07 MED ORDER — ROCURONIUM BROMIDE 50 MG/5ML IV SOLN
INTRAVENOUS | Status: AC
Start: 2015-05-07 — End: 2015-05-07
  Filled 2015-05-07: qty 2

## 2015-05-07 MED ORDER — MANNITOL 25 % IV SOLN
INTRAVENOUS | Status: DC | PRN
  Administered 2015-05-07: 25 g via INTRAVENOUS

## 2015-05-07 MED ORDER — HYDROCODONE-ACETAMINOPHEN 5-325 MG PO TABS
1.0000 | ORAL_TABLET | ORAL | Status: DC | PRN
  Administered 2015-05-07 – 2015-05-11 (×12): 1 via ORAL
  Filled 2015-05-07 (×12): qty 1

## 2015-05-07 MED ORDER — ENALAPRILAT 1.25 MG/ML IV SOLN
0.6250 mg | Freq: Four times a day (QID) | INTRAVENOUS | Status: DC | PRN
  Filled 2015-05-07: qty 0.5

## 2015-05-07 MED ORDER — PROMETHAZINE HCL 25 MG/ML IJ SOLN: 6.2500 mg | INTRAMUSCULAR | Status: DC | PRN

## 2015-05-07 MED ORDER — ONDANSETRON HCL 4 MG PO TABS: 4.0000 mg | ORAL_TABLET | ORAL | Status: DC | PRN

## 2015-05-07 MED ORDER — LIDOCAINE HCL (CARDIAC) 20 MG/ML IV SOLN
INTRAVENOUS | Status: DC | PRN
  Administered 2015-05-07: 80 mg via INTRAVENOUS

## 2015-05-07 MED ORDER — SCOPOLAMINE 1 MG/3DAYS TD PT72
MEDICATED_PATCH | TRANSDERMAL | Status: DC | PRN
  Administered 2015-05-07: 1 via TRANSDERMAL

## 2015-05-07 MED ORDER — HYDRALAZINE HCL 20 MG/ML IJ SOLN
5.0000 mg | INTRAMUSCULAR | Status: DC | PRN
  Administered 2015-05-07: 5 mg via INTRAVENOUS
  Filled 2015-05-07: qty 1

## 2015-05-07 MED ORDER — SODIUM CHLORIDE 0.9 % IJ SOLN
INTRAMUSCULAR | Status: AC
  Filled 2015-05-07: qty 10

## 2015-05-07 MED ORDER — DEXAMETHASONE SODIUM PHOSPHATE 10 MG/ML IJ SOLN
INTRAMUSCULAR | Status: DC | PRN
  Administered 2015-05-07: 10 mg via INTRAVENOUS

## 2015-05-07 MED ORDER — LABETALOL HCL 5 MG/ML IV SOLN
INTRAVENOUS | Status: AC
  Filled 2015-05-07: qty 4

## 2015-05-07 MED ORDER — CEFAZOLIN SODIUM-DEXTROSE 2-3 GM-% IV SOLR
INTRAVENOUS | Status: AC
Start: 2015-05-07 — End: 2015-05-07
  Filled 2015-05-07: qty 50

## 2015-05-07 MED ORDER — PANTOPRAZOLE SODIUM 40 MG IV SOLR
40.0000 mg | Freq: Every day | INTRAVENOUS | Status: DC
  Administered 2015-05-07 – 2015-05-08 (×2): 40 mg via INTRAVENOUS
  Filled 2015-05-07 (×2): qty 40

## 2015-05-07 MED ORDER — OXCARBAZEPINE 300 MG PO TABS
900.0000 mg | ORAL_TABLET | Freq: Two times a day (BID) | ORAL | Status: DC
  Administered 2015-05-07 – 2015-05-13 (×11): 900 mg via ORAL
  Filled 2015-05-07 (×15): qty 3

## 2015-05-07 MED ORDER — METOCLOPRAMIDE HCL 10 MG PO TABS
10.0000 mg | ORAL_TABLET | Freq: Three times a day (TID) | ORAL | Status: DC | PRN
  Administered 2015-05-09 – 2015-05-10 (×3): 10 mg via ORAL
  Filled 2015-05-07 (×3): qty 1

## 2015-05-07 MED ORDER — EPHEDRINE SULFATE 50 MG/ML IJ SOLN
INTRAMUSCULAR | Status: AC
  Filled 2015-05-07: qty 1

## 2015-05-07 MED ORDER — MAGNESIUM CITRATE PO SOLN
1.0000 | Freq: Once | ORAL | Status: DC | PRN
  Filled 2015-05-07: qty 296

## 2015-05-07 MED ORDER — HEMOSTATIC AGENTS (NO CHARGE) OPTIME
TOPICAL | Status: DC | PRN
  Administered 2015-05-07 (×2): 1 via TOPICAL

## 2015-05-07 MED ORDER — DEXAMETHASONE SODIUM PHOSPHATE 10 MG/ML IJ SOLN
6.0000 mg | Freq: Four times a day (QID) | INTRAMUSCULAR | Status: DC
  Administered 2015-05-07 – 2015-05-08 (×3): 6 mg via INTRAVENOUS
  Filled 2015-05-07 (×3): qty 1

## 2015-05-07 MED ORDER — FENTANYL CITRATE (PF) 250 MCG/5ML IJ SOLN
INTRAMUSCULAR | Status: AC
  Filled 2015-05-07: qty 5

## 2015-05-07 MED ORDER — HYDROMORPHONE HCL 1 MG/ML IJ SOLN
1.0000 mg | INTRAMUSCULAR | Status: DC | PRN
  Administered 2015-05-07 – 2015-05-08 (×2): 1 mg via INTRAVENOUS
  Filled 2015-05-07 (×2): qty 1

## 2015-05-07 MED ORDER — SENNOSIDES-DOCUSATE SODIUM 8.6-50 MG PO TABS: 1.0000 | ORAL_TABLET | Freq: Every evening | ORAL | Status: DC | PRN

## 2015-05-07 MED ORDER — NITROFURANTOIN MACROCRYSTAL 50 MG PO CAPS
50.0000 mg | ORAL_CAPSULE | Freq: Every day | ORAL | Status: DC
  Administered 2015-05-08 – 2015-05-13 (×6): 50 mg via ORAL
  Filled 2015-05-07 (×6): qty 1

## 2015-05-07 MED ORDER — THROMBIN 5000 UNITS EX SOLR
CUTANEOUS | Status: DC | PRN
  Administered 2015-05-07 (×2): 5000 [IU] via TOPICAL

## 2015-05-07 MED ORDER — SODIUM CHLORIDE 0.9 % IV SOLN
INTRAVENOUS | Status: DC | PRN
  Administered 2015-05-07: 10:00:00 via INTRAVENOUS

## 2015-05-07 MED ORDER — PROMETHAZINE HCL 25 MG PO TABS
12.5000 mg | ORAL_TABLET | ORAL | Status: DC | PRN
  Administered 2015-05-07 – 2015-05-08 (×2): 25 mg via ORAL
  Filled 2015-05-07 (×2): qty 1

## 2015-05-07 MED ORDER — BACITRACIN ZINC 500 UNIT/GM EX OINT
TOPICAL_OINTMENT | CUTANEOUS | Status: DC | PRN
  Administered 2015-05-07 (×2): 1 via TOPICAL

## 2015-05-07 MED ORDER — PHENYLEPHRINE HCL 10 MG/ML IJ SOLN
10.0000 mg | INTRAMUSCULAR | Status: DC | PRN
  Administered 2015-05-07: 25 ug/min via INTRAVENOUS

## 2015-05-07 MED ORDER — PROMETHAZINE HCL 25 MG/ML IJ SOLN
INTRAMUSCULAR | Status: AC
  Filled 2015-05-07: qty 1

## 2015-05-07 MED ORDER — THROMBIN 20000 UNITS EX SOLR
CUTANEOUS | Status: DC | PRN
  Administered 2015-05-07: 11:00:00 via TOPICAL

## 2015-05-07 MED ORDER — 0.9 % SODIUM CHLORIDE (POUR BTL) OPTIME
TOPICAL | Status: DC | PRN
  Administered 2015-05-07 (×3): 1000 mL

## 2015-05-07 SURGICAL SUPPLY — 99 items
APL SRG 60D 8 XTD TIP BNDBL (TIP) ×1
BLADE CLIPPER SURG (BLADE) ×1 IMPLANT
BLADE EYE SICKLE 84 5 BEAV (BLADE) ×1 IMPLANT
BLADE SURG 15 STRL LF DISP TIS (BLADE) IMPLANT
BLADE SURG 15 STRL SS (BLADE) ×2
BLADE ULTRA TIP 2M (BLADE) IMPLANT
BRUSH SCRUB EZ 1% IODOPHOR (MISCELLANEOUS) IMPLANT
BUR ACORN 6.0 PRECISION (BURR) ×1 IMPLANT
BUR ROUND FLUTED 5 RND (BURR) ×1 IMPLANT
CANISTER SUCT 3000ML PPV (MISCELLANEOUS) ×1 IMPLANT
CATH VENTRIC 35X38 W/TROCAR LG (CATHETERS) IMPLANT
CLIP TI MEDIUM 6 (CLIP) IMPLANT
CORDS BIPOLAR (ELECTRODE) ×1 IMPLANT
COVER BACK TABLE 60X90IN (DRAPES) IMPLANT
DECANTER SPIKE VIAL GLASS SM (MISCELLANEOUS) ×2 IMPLANT
DRAIN SNY WOU 7FLT (WOUND CARE) IMPLANT
DRAPE MICROSCOPE LEICA (MISCELLANEOUS) ×2 IMPLANT
DRAPE NEUROLOGICAL W/INCISE (DRAPES) ×2 IMPLANT
DRAPE WARM FLUID 44X44 (DRAPE) ×2 IMPLANT
DURAPREP 26ML APPLICATOR (WOUND CARE) ×2 IMPLANT
DURASEAL APPLICATOR TIP (TIP) ×1 IMPLANT
DURASEAL SPINE SEALANT 3ML (MISCELLANEOUS) ×1 IMPLANT
ELECT CAUTERY BLADE 6.4 (BLADE) ×1 IMPLANT
ELECT REM PT RETURN 9FT ADLT (ELECTROSURGICAL) ×2
ELECTRODE REM PT RTRN 9FT ADLT (ELECTROSURGICAL) ×1 IMPLANT
EVACUATOR 1/8 PVC DRAIN (DRAIN) IMPLANT
EVACUATOR SILICONE 100CC (DRAIN) IMPLANT
FELT TEFLON 6X6 (MISCELLANEOUS) ×2 IMPLANT
GAUZE SPONGE 4X4 12PLY STRL (GAUZE/BANDAGES/DRESSINGS) ×1 IMPLANT
GAUZE SPONGE 4X4 16PLY XRAY LF (GAUZE/BANDAGES/DRESSINGS) IMPLANT
GLOVE BIO SURGEON STRL SZ 6.5 (GLOVE) IMPLANT
GLOVE BIO SURGEON STRL SZ7 (GLOVE) IMPLANT
GLOVE BIO SURGEON STRL SZ7.5 (GLOVE) IMPLANT
GLOVE BIO SURGEON STRL SZ8 (GLOVE) ×1 IMPLANT
GLOVE BIO SURGEON STRL SZ8.5 (GLOVE) IMPLANT
GLOVE BIOGEL M 8.0 STRL (GLOVE) IMPLANT
GLOVE BIOGEL PI IND STRL 8 (GLOVE) IMPLANT
GLOVE BIOGEL PI INDICATOR 8 (GLOVE) ×4
GLOVE ECLIPSE 6.5 STRL STRAW (GLOVE) ×4 IMPLANT
GLOVE ECLIPSE 7.0 STRL STRAW (GLOVE) IMPLANT
GLOVE ECLIPSE 7.5 STRL STRAW (GLOVE) ×4 IMPLANT
GLOVE ECLIPSE 8.0 STRL XLNG CF (GLOVE) IMPLANT
GLOVE ECLIPSE 8.5 STRL (GLOVE) IMPLANT
GLOVE EXAM NITRILE LRG STRL (GLOVE) IMPLANT
GLOVE EXAM NITRILE MD LF STRL (GLOVE) IMPLANT
GLOVE EXAM NITRILE XL STR (GLOVE) IMPLANT
GLOVE EXAM NITRILE XS STR PU (GLOVE) IMPLANT
GLOVE INDICATOR 6.5 STRL GRN (GLOVE) IMPLANT
GLOVE INDICATOR 7.0 STRL GRN (GLOVE) IMPLANT
GLOVE INDICATOR 7.5 STRL GRN (GLOVE) IMPLANT
GLOVE INDICATOR 8.0 STRL GRN (GLOVE) IMPLANT
GLOVE INDICATOR 8.5 STRL (GLOVE) ×1 IMPLANT
GLOVE OPTIFIT SS 8.0 STRL (GLOVE) IMPLANT
GLOVE SURG SS PI 6.5 STRL IVOR (GLOVE) IMPLANT
GOWN STRL REUS W/ TWL LRG LVL3 (GOWN DISPOSABLE) ×2 IMPLANT
GOWN STRL REUS W/ TWL XL LVL3 (GOWN DISPOSABLE) IMPLANT
GOWN STRL REUS W/TWL 2XL LVL3 (GOWN DISPOSABLE) ×2 IMPLANT
GOWN STRL REUS W/TWL LRG LVL3 (GOWN DISPOSABLE) ×2
GOWN STRL REUS W/TWL XL LVL3 (GOWN DISPOSABLE) ×2
GRAFT DURAGEN MATRIX 2WX2L ×1 IMPLANT
HEMOSTAT SURGICEL 2X14 (HEMOSTASIS) ×2 IMPLANT
HOOK DURA 1/2IN (MISCELLANEOUS) ×1 IMPLANT
KIT BASIN OR (CUSTOM PROCEDURE TRAY) ×2 IMPLANT
KIT DRAIN CSF ACCUDRAIN (MISCELLANEOUS) IMPLANT
KIT ROOM TURNOVER OR (KITS) ×2 IMPLANT
MARKER SKIN DUAL TIP RULER LAB (MISCELLANEOUS) ×1 IMPLANT
NDL HYPO 25X1 1.5 SAFETY (NEEDLE) ×1 IMPLANT
NDL SPNL 18GX3.5 QUINCKE PK (NEEDLE) IMPLANT
NEEDLE HYPO 25X1 1.5 SAFETY (NEEDLE) ×2 IMPLANT
NEEDLE SPNL 18GX3.5 QUINCKE PK (NEEDLE) IMPLANT
NS IRRIG 1000ML POUR BTL (IV SOLUTION) ×4 IMPLANT
PACK CRANIOTOMY (CUSTOM PROCEDURE TRAY) ×2 IMPLANT
PAD ARMBOARD 7.5X6 YLW CONV (MISCELLANEOUS) ×6 IMPLANT
PATTIES SURGICAL .25X.25 (GAUZE/BANDAGES/DRESSINGS) ×1 IMPLANT
PATTIES SURGICAL .5 X.5 (GAUZE/BANDAGES/DRESSINGS) ×4 IMPLANT
PATTIES SURGICAL .5 X3 (DISPOSABLE) ×2 IMPLANT
PATTIES SURGICAL 1/4 X 3 (GAUZE/BANDAGES/DRESSINGS) IMPLANT
PATTIES SURGICAL 1X1 (DISPOSABLE) ×1 IMPLANT
PIN MAYFIELD SKULL DISP (PIN) ×1 IMPLANT
PLATE 1.5/0.2 85X50M HEX PNL (Plate) ×1 IMPLANT
SCREW SELF DRILL HT 1.5/4MM (Screw) ×10 IMPLANT
SPONGE NEURO XRAY DETECT 1X3 (DISPOSABLE) IMPLANT
SPONGE SURGIFOAM ABS GEL 100 (HEMOSTASIS) ×1 IMPLANT
STAPLER SKIN PROX WIDE 3.9 (STAPLE) ×3 IMPLANT
SUT BONE WAX W31G (SUTURE) ×2 IMPLANT
SUT ETHILON 3 0 FSL (SUTURE) ×1 IMPLANT
SUT NURALON 4 0 TR CR/8 (SUTURE) ×5 IMPLANT
SUT PROLENE 6 0 BV (SUTURE) IMPLANT
SUT VIC AB 2-0 CT1 18 (SUTURE) ×3 IMPLANT
SUT VIC AB 3-0 SH 8-18 (SUTURE) ×2 IMPLANT
SYR CONTROL 10ML LL (SYRINGE) ×1 IMPLANT
TAPE CLOTH 4X10 WHT NS (GAUZE/BANDAGES/DRESSINGS) ×1 IMPLANT
TIP NONSTICK .5MMX23CM (INSTRUMENTS) ×2
TIP NONSTICK .5X23 (INSTRUMENTS) IMPLANT
TOWEL OR 17X24 6PK STRL BLUE (TOWEL DISPOSABLE) IMPLANT
TOWEL OR 17X26 10 PK STRL BLUE (TOWEL DISPOSABLE) ×2 IMPLANT
TRAY FOLEY W/METER SILVER 14FR (SET/KITS/TRAYS/PACK) ×1 IMPLANT
UNDERPAD 30X30 INCONTINENT (UNDERPADS AND DIAPERS) IMPLANT
WATER STERILE IRR 1000ML POUR (IV SOLUTION) ×2 IMPLANT

## 2015-05-07 NOTE — Transfer of Care (Signed)
Immediate Anesthesia Transfer of Care Note  Patient: [name]  Procedure(s) Performed: Procedure(s) with comments: Microvascular decompression for trigeminal neuralgia (Left) - Craniectomy for microvascular decompression for trigeminal neuralgia  Patient Location: PACU  Anesthesia Type:General  Level of Consciousness: sedated  Airway & Oxygen Therapy: Patient Spontanous Breathing and Patient connected to nasal cannula oxygen  Post-op Assessment: Report given to RN, Post -op Vital signs reviewed and stable and Patient moving all extremities  Post vital signs: Reviewed and stable  Last Vitals:  Filed Vitals:   05/07/15 0748  BP: 132/86  Pulse: 50  Temp: 36.4 C  Resp: 18    Complications: No apparent anesthesia complications

## 2015-05-07 NOTE — Anesthesia Preprocedure Evaluation (Addendum)
Anesthesia Evaluation  Patient identified by MRN, date of birth, ID band Patient awake    Reviewed: Allergy & Precautions, NPO status   History of Anesthesia Complications (+) PONV  Airway Mallampati: I  TM Distance: >3 FB Neck ROM: Full    Dental no notable dental hx. (+) Dental Advisory Given   Pulmonary former smoker,    Pulmonary exam normal        Cardiovascular hypertension, + Peripheral Vascular Disease  Normal cardiovascular exam     Neuro/Psych  Headaches,  Neuromuscular disease    GI/Hepatic Neg liver ROS, GERD  ,  Endo/Other  negative endocrine ROS  Renal/GU negative Renal ROS     Musculoskeletal   Abdominal Normal abdominal exam  (+)   Peds  Hematology Hx of venous thrombosis and occlussion, factor V Leiden Was previously on Xarelto   Anesthesia Other Findings   Reproductive/Obstetrics                            Anesthesia Physical Anesthesia Plan  ASA: III  Anesthesia Plan: General   Post-op Pain Management:    Induction:   Airway Management Planned: Oral ETT  Additional Equipment: Arterial line  Intra-op Plan:   Post-operative Plan: Extubation in OR  Informed Consent: I have reviewed the patients History and Physical, chart, labs and discussed the procedure including the risks, benefits and alternatives for the proposed anesthesia with the patient or authorized representative who has indicated his/her understanding and acceptance.   Dental advisory given  Plan Discussed with: CRNA and Surgeon  Anesthesia Plan Comments:         Anesthesia Quick Evaluation

## 2015-05-07 NOTE — Anesthesia Procedure Notes (Signed)
Procedure Name: Intubation Date/Time: 05/07/2015 9:46 AM Performed by: Trixie Deis A Pre-anesthesia Checklist: Patient identified, Emergency Drugs available, Suction available, Patient being monitored and Timeout performed Patient Re-evaluated:Patient Re-evaluated prior to inductionOxygen Delivery Method: Circle system utilized Preoxygenation: Pre-oxygenation with 100% oxygen Intubation Type: IV induction Ventilation: Mask ventilation without difficulty and Oral airway inserted - appropriate to patient size Laryngoscope Size: Mac and 4 Grade View: Grade I Tube type: Oral Tube size: 7.5 mm Number of attempts: 1 Airway Equipment and Method: Stylet Placement Confirmation: ETT inserted through vocal cords under direct vision,  positive ETCO2 and breath sounds checked- equal and bilateral Secured at: 23 cm Tube secured with: Tape Dental Injury: Teeth and Oropharynx as per pre-operative assessment

## 2015-05-07 NOTE — Op Note (Signed)
05/07/2015  3:48 PM  PATIENT:  [name]  70 y.o. male with unrelenting pain due to trigeminal neuralgia.He has opted for a microvascular decompression.  PRE-OPERATIVE DIAGNOSIS: Left  trigeminal neuralgia  POST-OPERATIVE DIAGNOSIS: Left trigeminal neuralgia  PROCEDURE:  Procedure(s): Left retrosigmoid suboccipital craniectomy Microvascular decompression for trigeminal neuralgia  SURGEON: Surgeon(s): Ashok Pall, MD Kary Kos, MD  ASSISTANTS:Cram, Dominica Severin  ANESTHESIA:   general  EBL:  Total I/O In: 2350 [I.V.:2100; IV Piggyback:250] Out: 1625 [Urine:875; Blood:750]  BLOOD ADMINISTERED:none  CELL SAVER GIVEN:none  COUNT:per nursing  DRAINS: none   SPECIMEN:  No Specimen  DICTATION: [name] was taken to the operating room, intubated, and placed under a general anesthetic without difficulty. male He was positioned supine on the operating room table. Once adequate anesthesia was obtained a three pin [place] headholder was placed allowing for exposure of the left mastoid region. I turned the head slightly to the left, with the nose angled towards the floor. I shaved behind the left ear, and the head was prepped and draped in a sterile manner.  I opened the skin with a curvilinear incision shaped like a bell curve with the top of the curve just higher than the junction of a line extending from the mastoid notch and its intersection with a line connecting the inion and zygoma. I reflected the flap laterally exposing the the occipital retromastoid area. I created a burr hole at the expected junction of the sigmoid and transverse sinuses. I used the drill to remove bone until the sigmoid sinus was exposed from the junction point laterally approximately 5 cm, and along the transverse sinus approximately 3.5 cm. I opened the dura to expose the cerebellum and the cerebello pontine angle. I dissected laterally around the cerebellum to open the cisterna magna, thus relaxing the  cerebellum and easing the subsequent dissection. I identified carefully with a flexible retractor retracted the cerebellum in an  infromedial direction. I coagulated then divided sharply veins draining from the cerebellum to the tentorium. I with Dr. Annamaria Boots assistance dissected around the trigeminal nerve and located a vein grooving the top of the trigeminal nerve. I did not encounter a loop of the SCA nor arterial structure impinging on the nerve root. I placed a piece of teflon between the nerve and the vein. I irrigated then closed the wound. I approximated the dura, then placed a titanium mesh to cover the craniectomy. I closed the incision with subcutaneous vicryl sutures and the skin with a running nylon suture. I removed the headholder and pins, and he was extubated.   PLAN OF CARE: Admit to inpatient   PATIENT DISPOSITION:  PACU - hemodynamically stable.   Delay start of Pharmacological VTE agent (>24hrs) due to surgical blood loss or risk of bleeding:  yes

## 2015-05-07 NOTE — H&P (Signed)
BP 132/86 mmHg  Pulse 50  Temp(Src) 97.5 F (36.4 C) (Oral)  Resp 18  Ht 6' (1.829 m)  Wt 86.977 kg (191 lb 12 oz)  BMI 26.00 kg/m2  SpO2 100%  [name] comes in today for evaluation of trigeminal neuralgia on the left side.  He has had this for about two to two and a half years.  He is maximized in medical treatment, and still is having the issue.  He comes today for evaluation of surgical options.  [name] is a 70 year old gentleman who presents today for evaluation of his trigeminal neuralgia.  He has had this pain for at the very least two years.  His is taking medication for it, but says that although it does help slightly, it is just not something he wishes to do for the rest of his life.     REVIEW OF SYSTEMS:                        Review of systems is positive for eyeglasses, eye infection, hypertension.  He denies constitutional, ear, nose, throat, and mouth, respiratory, gastrointestinal, genitourinary, musculoskeletal, skin, neurological, psychiatric, endocrine, hematologic, and allergic problems.  He has classic trigeminal pain in the left side of his face.      PAST MEDICAL HISTORY:                    Past medical history includes hypertension.      PAST SURGICAL HISTORY:                  He has undergone surgery on his right ankle, and his right wrist.     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     CURRENT MEDICATIONS:                     He is taking oxcarbazepine, baclofen, tamsulosin, finasteride, nitrofurantoin, lisinopril, Xarelto, fish oil, atenolol.     FAMILY HISTORY:                                Mother died at age 70 secondary to myocardial infarction.  Father died age 59 due to a myocardial infarction.     SOCIAL HISTORY:                                He does not smoke.  He does not use alcohol.  He does not use illicit drugs.     PHYSICAL EXAMINATION:                    Vital signs, height  6 feet, weight 190 pounds, temperature is 98.1, blood pressure is 106/70, pulse is 54, pain right now is 0/10.     On examination he is alert, oriented x4, and answers all questions appropriately.  Memory, language, attention span, and fund of knowledge is normal.  Speech is clear, it is fluent.  Symmetric facial sensation and movement.  Uvula elevates in the midline.  Shoulder shrug is normal.  Tongue protrudes in the midline.  Hearing intact to  finger rub bilaterally.  5/5 strength in both upper and lower extremities.  Normal muscle tone, bulk, and coordination.  Reflexes are 2+ at biceps, triceps, brachioradialis, knees, and ankles.  Proprioception is intact.  Gait is normal.  Romberg is negative.      IMAGING STUDIES:                              MRI of the posterior fossa was performed.  I cannot say I see where there is a vessel impinging on the nerve, but it is hard to make out the trigeminal nerve on the left side, and there is no sense anyway.     IMPRESSION/PLAN:                             I have recommended that he undergo microvascular decompression of the fifth cranial nerve on the left side.  Since this is a non destructive procedure, it leaves any and all other options available to him.  He could undergo a retrogasserian R frequency neurotomy, he could undergo a glycerol rhizotomy, he could undergo gamma knife, but once you do a destructive procedure, you are kind of left with anything else coming after as being less effective.  Risks and benefits include brain damage, stroke, coma, death, no effectiveness, no pain relief, damage to the 7th and/or 8th nerves, deafness, weakness, problems with coordination.  I have explained the procedure to [name].  He understands and wishes to proceed.  Other risks were also discussed.

## 2015-05-08 ENCOUNTER — Encounter (HOSPITAL_COMMUNITY): Payer: Self-pay | Admitting: Neurosurgery

## 2015-05-08 LAB — POCT I-STAT 4, (NA,K, GLUC, HGB,HCT)
GLUCOSE: 125 mg/dL — AB (ref 65–99)
HCT: 37 % — ABNORMAL LOW (ref 39.0–52.0)
HEMOGLOBIN: 12.6 g/dL — AB (ref 13.0–17.0)
POTASSIUM: 4.1 mmol/L (ref 3.5–5.1)
SODIUM: 138 mmol/L (ref 135–145)

## 2015-05-08 MED ORDER — ENOXAPARIN SODIUM 80 MG/0.8ML ~~LOC~~ SOLN: 80.0000 mg | Freq: Two times a day (BID) | SUBCUTANEOUS | Status: DC

## 2015-05-08 MED ORDER — DEXAMETHASONE 4 MG PO TABS
4.0000 mg | ORAL_TABLET | Freq: Three times a day (TID) | ORAL | Status: DC
  Administered 2015-05-08 – 2015-05-13 (×16): 4 mg via ORAL
  Filled 2015-05-08 (×16): qty 1

## 2015-05-08 MED ORDER — POLYVINYL ALCOHOL 1.4 % OP SOLN
1.0000 [drp] | Freq: Four times a day (QID) | OPHTHALMIC | Status: DC | PRN
  Administered 2015-05-08: 1 [drp] via OPHTHALMIC
  Filled 2015-05-08: qty 15

## 2015-05-08 MED ORDER — LEVETIRACETAM 500 MG PO TABS
500.0000 mg | ORAL_TABLET | Freq: Two times a day (BID) | ORAL | Status: DC
  Administered 2015-05-08 – 2015-05-13 (×10): 500 mg via ORAL
  Filled 2015-05-08 (×10): qty 1

## 2015-05-08 MED ORDER — NAPHAZOLINE-GLYCERIN 0.012-0.2 % OP SOLN: 1.0000 [drp] | Freq: Four times a day (QID) | OPHTHALMIC | Status: DC | PRN

## 2015-05-08 NOTE — Progress Notes (Signed)
Pt arrived to 5M18 via wheelchair.  No c/o pain, in no apparent distress.  Dressing clean dry and intact. Pt alert and oriented.  Will continue to monitor. Cori Razor, RN

## 2015-05-08 NOTE — Telephone Encounter (Signed)
Left message on patient's voicemail to return call

## 2015-05-08 NOTE — Care Management Note (Signed)
Case Management Note  Patient Details  Name: KATINA TAIRA MRN: [id number] Date of Birth: 08-27-1945  Subjective/Objective:   Pt admitted on 05/07/15 s/p craniotomy for trigeminal neuralgia.  PTA, pt independent of ADLS.                 Action/Plan: Will follow for discharge planning as pt progresses.    Expected Discharge Date:                  Expected Discharge Plan:  Home/Self Care  In-House Referral:     Discharge planning Services  CM Consult  Post Acute Care Choice:    Choice offered to:     DME Arranged:    DME Agency:     HH Arranged:    HH Agency:     Status of Service:  In process, will continue to follow  Medicare Important Message Given:    Date Medicare IM Given:    Medicare IM give by:    Date Additional Medicare IM Given:    Additional Medicare Important Message give by:     If discussed at [place] of Stay Meetings, dates discussed:    Additional Comments:  Reinaldo Raddle, RN, BSN  Trauma/Neuro ICU Case Manager [phone]

## 2015-05-08 NOTE — Telephone Encounter (Signed)
please call pt. I talked with Dr. Gaynelle Arabian.  Patient will need bridge with lovenox.   He'll stop xarelto for 7 full days before the surgery.   He'll start taking lovenox BID for the 7 days before the surgery.  He'll take the 14th and last dose the night before the surgery.  He won't take either med the AM of the surgery.  Post op anticoagulation will be directed by Dr. Gaynelle Arabian and/or the hospitalist, with the likely transition back to [place].  He should expect some post op bleeding, per Dr. Arlyn Leak report.  I sent rx for lovenox.  Thanks.

## 2015-05-09 ENCOUNTER — Encounter: Payer: Self-pay | Admitting: *Deleted

## 2015-05-09 MED ORDER — RIVAROXABAN 20 MG PO TABS
20.0000 mg | ORAL_TABLET | Freq: Every evening | ORAL | Status: DC
  Administered 2015-05-10 – 2015-05-12 (×3): 20 mg via ORAL
  Filled 2015-05-09 (×5): qty 1

## 2015-05-09 MED ORDER — PANTOPRAZOLE SODIUM 40 MG PO TBEC
40.0000 mg | DELAYED_RELEASE_TABLET | Freq: Every day | ORAL | Status: DC
  Administered 2015-05-09 – 2015-05-12 (×4): 40 mg via ORAL
  Filled 2015-05-09 (×4): qty 1

## 2015-05-09 NOTE — Care Management Important Message (Signed)
Important Message  Patient Details  Name: [name] MRN: [id number] Date of Birth: 26-Nov-1945   Medicare Important Message Given:  Yes    [name] 05/09/2015, 4:15 PM

## 2015-05-09 NOTE — Progress Notes (Signed)
Patient ID: [name], male   DOB: Jan 01, 1946, 70 y.o.   MRN: [id number] BP 165/92 mmHg  Pulse 63  Temp(Src) 98.9 F (37.2 C) (Oral)  Resp 18  Ht 6' (1.829 m)  Wt 86.977 kg (191 lb 12 oz)  BMI 26.00 kg/m2  SpO2 98% Alert and oriented x 4, facial numbness subsiding Wound is clean, dry, no signs of infection Improving slowly. May be ready for discharge over the weekend.

## 2015-05-09 NOTE — Anesthesia Postprocedure Evaluation (Signed)
Anesthesia Post Note  Patient: TRAVARIOUS PANGELINAN  Procedure(s) Performed: Procedure(s) (LRB): Microvascular decompression for trigeminal neuralgia (Left)  Patient location during evaluation: PACU Anesthesia Type: General Level of consciousness: awake and alert Pain management: pain level controlled Vital Signs Assessment: post-procedure vital signs reviewed and stable Respiratory status: spontaneous breathing, nonlabored ventilation, respiratory function stable and patient connected to nasal cannula oxygen Cardiovascular status: blood pressure returned to baseline and stable Postop Assessment: no signs of nausea or vomiting Anesthetic complications: no    Last Vitals:  Filed Vitals:   05/09/15 0525 05/09/15 1025  BP: 138/75 156/92  Pulse:  63  Temp: 36.8 C 36.6 C  Resp: 18 18    Last Pain:  Filed Vitals:   05/09/15 1439  PainSc: 7                  Carra Brindley,JAMES TERRILL

## 2015-05-09 NOTE — Telephone Encounter (Signed)
Patient advised.   Patient is in the hospital now from surgery on his "jaw".  He was advised to stop the Xarelto prior to this surgery as well but no Lovenox bridge.  His first surgery with Dr. Gaynelle Arabian is scheduled for 05/23/15 for ?TURP and then later in the month for bladder repair.  Patient did not seem real alert when I explained the instructions so I asked him to please call me later if he had any questions.  I am also going to mail the instructions to him.  Patient is not sure when he will be discharged from the hospital for this current procedure.

## 2015-05-10 MED ORDER — PROMETHAZINE HCL 25 MG/ML IJ SOLN
12.5000 mg | Freq: Four times a day (QID) | INTRAMUSCULAR | Status: DC | PRN
Start: 2015-05-10 — End: 2015-05-14
  Administered 2015-05-10 – 2015-05-11 (×4): 12.5 mg via INTRAVENOUS
  Filled 2015-05-10 (×4): qty 1

## 2015-05-10 MED ORDER — METOCLOPRAMIDE HCL 5 MG PO TABS: 5.0000 mg | ORAL_TABLET | Freq: Three times a day (TID) | ORAL | Status: DC | PRN

## 2015-05-10 MED ORDER — DIAZEPAM 5 MG PO TABS
5.0000 mg | ORAL_TABLET | Freq: Three times a day (TID) | ORAL | Status: DC | PRN
  Administered 2015-05-10: 5 mg via ORAL
  Filled 2015-05-10: qty 1

## 2015-05-10 NOTE — Progress Notes (Signed)
repaged DR. About anitemetic, son has left for night.

## 2015-05-10 NOTE — Progress Notes (Signed)
Patient's son wanted to speak with the MD. MD's office called to notify. Awaiting reply.

## 2015-05-10 NOTE — Progress Notes (Addendum)
Upon shift change patient was upset because he was hungry (nausea/vomitting all day) after antiemetic started to become effective.  Spoke with Jenetta Downer who had already called about the jello two times prior to shift change.  I called service response around 7:15 and spoke to [place] about patient problem, she said she would notifiy the kitchen again.  Waited for jello to arrive never did; recalled service response and spoke to [place] who said she had notified them already but that she would recall.  Patient still has not received his jello at this time, he is LIVID!  He is currently calling family members to see if he can get them to come here and go buy him some jello.  Patient upset because we have not provided any dinner for him.  Notified Phil who is CN tonight about situation.  Finally received 2 cups of lemon Jello at 20:43, at the same time his family member (son).

## 2015-05-10 NOTE — Progress Notes (Signed)
Paged Neurosurgery to try to get some antiemtics for patients.  Son is at bedside and is questioning why he cant have zofran since he is not allergic to it.....but it is listed under his allergies, so that is why we have not been able to give him this medication.  Son was present when rounding Dr. Council Mechanic today, which told him he would put order in for him, which he has not.  Francisca called on-call DR.about this twice today and still no orders..... I have just paged for the first time.

## 2015-05-10 NOTE — Progress Notes (Signed)
Patient still complaining of nausea after being given phenergan and compazine. Patient has allergy to Zofran. MD's office called to notify.

## 2015-05-10 NOTE — Progress Notes (Signed)
Subjective: Patient reports facial pain improved, but nauseated  Objective: Vital signs in last 24 hours: Temp:  [97.9 F (36.6 C)-98.9 F (37.2 C)] 98.1 F (36.7 C) (02/04 0635) Pulse Rate:  [58-70] 58 (02/04 0635) Resp:  [18] 18 (02/04 0635) BP: (148-165)/(78-92) 165/85 mmHg (02/04 0635) SpO2:  [97 %-99 %] 99 % (02/04 0635)  Intake/Output from previous day: 02/03 0701 - 02/04 0700 In: 360 [P.O.:360] Out: -  Intake/Output this shift:    Physical Exam: Facial pain improved.  Incision CDI.  Lab Results:  Recent Labs  05/07/15 1155  HGB 12.6*  HCT 37.0*   BMET  Recent Labs  05/07/15 1155  NA 138  K 4.1  GLUCOSE 125*    Studies/Results: No results found.  Assessment/Plan: Will work on nausea.    LOS: 3 days    Peggyann Shoals, MD 05/10/2015, 7:54 AM

## 2015-05-10 NOTE — Progress Notes (Signed)
Patient told Probation officer he vomited. Was not observed. Compazine given, will continue to monitor.

## 2015-05-11 MED ORDER — ONDANSETRON HCL 4 MG/2ML IJ SOLN
4.0000 mg | Freq: Four times a day (QID) | INTRAMUSCULAR | Status: DC | PRN
  Administered 2015-05-11 – 2015-05-12 (×4): 4 mg via INTRAVENOUS
  Filled 2015-05-11 (×4): qty 2

## 2015-05-11 NOTE — Progress Notes (Signed)
No issues overnight. Pt reports some improvement in N/V overnight. No other c/o, no facial pain since surgery  EXAM:  BP 110/75 mmHg  Pulse 76  Temp(Src) 98 F (36.7 C) (Oral)  Resp 18  Ht 6' (1.829 m)  Wt 86.977 kg (191 lb 12 oz)  BMI 26.00 kg/m2  SpO2 94%  Awake, alert, oriented  Speech fluent, appropriate  CN grossly intact, face symmetric, hearing intact 5/5 BUE/BLE   IMPRESSION:  70 y.o. male POD# 4 s/p MVD, intact with postop N/V  PLAN: - Will try Zofran, pt reports no known allergy/rxn to zofran in the past.

## 2015-05-11 NOTE — Telephone Encounter (Signed)
Thanks, noted, please phone patient after he is discharged from current inpatient stay to make sure he is clear on the lovenox instructions for the urology procedure.  Still inpatient as of 05/11/15. Thanks.

## 2015-05-12 ENCOUNTER — Inpatient Hospital Stay: Payer: Medicare Other

## 2015-05-12 NOTE — Progress Notes (Signed)
Patient ID: [name], male   DOB: Sep 15, 1945, 70 y.o.   MRN: [id number] BP 94/74 mmHg  Pulse 70  Temp(Src) 97.6 F (36.4 C) (Oral)  Resp 20  Ht 6' (1.829 m)  Wt 86.977 kg (191 lb 12 oz)  BMI 26.00 kg/m2  SpO2 97% Alert and oriented x 4 Speech is clear and fluent Moving lower extremities well Wound is clean and dry Complaining of diplopia, vertical for the most part.

## 2015-05-12 NOTE — Care Management Important Message (Signed)
Important Message  Patient Details  Name: NILS HONOLD MRN: [id number] Date of Birth: 1945/12/23   Medicare Important Message Given:  Yes    Louanne Belton 05/12/2015, 11:42 AMImportant Message  Patient Details  Name: KHALYL CASABLANCA MRN: [id number] Date of Birth: March 18, 1946   Medicare Important Message Given:  Yes    Ranita Stjulien G 05/12/2015, 11:42 AM

## 2015-05-13 NOTE — Discharge Instructions (Signed)
Craniotomy °Care After °Please read the instructions outlined below and refer to this sheet in the next few weeks. These discharge instructions provide you with general information on caring for yourself after you leave the hospital. Your surgeon may also give you specific instructions. While your treatment has been planned according to the most current medical practices available, unavoidable complications occasionally occur. If you have any problems or questions after discharge, please call your surgeon. °Although there are many types of brain surgery, recovery following craniotomy (surgical opening of the skull) is much the same for each. However, recovery depends on many factors. These include the type and severity of brain injury and the type of surgery. It also depends on any nervous system function problems (neurological deficits) before surgery. If the craniotomy was done for cancer, chemotherapy and radiation could follow. You could be in the hospital from 5 days to a couple weeks. This depends on the type of surgery, findings, and whether there are complications. °HOME CARE INSTRUCTIONS  °· It is not unusual to hear a clicking noise after a craniotomy, the plates and screws used to attach the bone flap can sometimes cause this. It is a normal occurrence if this does happen °· Do not drive for 10 days after the operation °· Your scalp may feel spongy for a while, because of fluid under it. This will gradually get better. Occasionally, the surgeon will not replace the bone that was removed to access the brain. If there is a bony defect, the surgeon will ask you to wear a helmet for protection. This is a discussion you should have with your surgeon prior to leaving the hospital (discharge). °· Numbness may persist in some areas of your scalp. °· Take all medications as directed. Sometimes steroids to control swelling are prescribed. Anticonvulsants to prevent seizures may also be given. Do not use alcohol,  other drugs, or medications unless your surgeon says it is OK. °· Keep the wound dry and clean. The wound may be washed gently with soap and water. Then, you may gently blot or dab it dry, without rubbing. Do not take baths, use swimming pools or hot tubs for 10 days, or as instructed by your caregiver. It is best to wait to see you surgeon at your first postoperative visit, and to get directions at that time. °· Only take over-the-counter or prescription medicines for pain, discomfort, or fever as directed by your caregiver. °· You may continue your normal diet, as directed. °· Walking is OK for exercise. Wait at least 3 months before you return to mild, non-contact sports or as your surgeon suggests. Contact sports should be avoided for at least 1 year, unless your surgeon says it is OK. °· If you are prescribed steroids, take them exactly as prescribed. If you start having a decrease in nervous system functions (neurological deficits) and headaches as the dose of steroids is reduced, tell your surgeon right away. °· When the anticonvulsant prescription is finished you no longer need to take it. °SEEK IMMEDIATE MEDICAL CARE IF:  °· You develop nausea, vomiting, severe headaches, confusion, or you have a seizure. °· You develop chest pain, a stiff neck, or difficulty breathing. °· There is redness, swelling, or increasing pain in the wound or pin insertion sites. °· You have an increase in swelling or bruising around the eyes. °· There is drainage or pus coming from the wound. °· You have an oral temperature above 102° F (38.9° C), not controlled by medicine. °·   You notice a foul smell coming from the wound or dressing. °· The wound breaks open (edges not staying together) after the stitches have been removed. °· You develop dizziness or fainting while standing. °· You develop a rash. °· You develop any reaction or side effects to the medications given. °Document Released: 06/22/2005 Document Revised: 06/14/2011  Document Reviewed: 03/31/2009 °ExitCare® Patient Information ©2013 [employer]. ° °

## 2015-05-13 NOTE — Discharge Summary (Signed)
  Physician Discharge Summary  Patient ID: [name] MRN: [id number] DOB/AGE: March 24, 1946 70 y.o.  Admit date: 05/07/2015 Discharge date: 05/13/2015  Admission Diagnoses:Left Trigeminal Neuralgia  Discharge Diagnoses: same Active Problems:   Trigeminal neuralgia of left side of face   S/P craniotomy   Discharged Condition: good  Hospital Course: Mr. Birner was admitted and taken to the operating room for an uncomplicated left retrosigmoid microvascular V nerve decompression. Post op he has had no csf leak, has had improvement in his pain. His wound at discharge is clean, dry, and without signs of infection. He is voiding, ambulating and tolerating a regular diet.   Treatments: surgery: as above  Discharge Exam: Blood pressure 132/76, pulse 72, temperature 97.5 F (36.4 C), temperature source Oral, resp. rate 20, height 6' (1.829 m), weight 86.977 kg (191 lb 12 oz), SpO2 99 %. General appearance: alert, cooperative, appears stated age and no distress Neurologic: Alert and oriented X 3, normal strength and tone. Normal symmetric reflexes. Normal coordination and gait  Disposition: 01-Home or Self Care trigeminal neuralgia    Medication List    ASK your doctor about these medications        aspirin EC 81 MG tablet  Take 81 mg by mouth daily.     baclofen 20 MG tablet  Commonly known as:  LIORESAL  Take 1 tablet (20 mg total) by mouth 3 (three) times daily.     enoxaparin 80 MG/0.8ML injection  Commonly known as:  LOVENOX  Inject 0.8 mLs (80 mg total) into the skin every 12 (twelve) hours. Use for the 7 days before surgery.  Stop xarelto while using.     finasteride 5 MG tablet  Commonly known as:  PROSCAR  TAKE 1 TABLET (5 MG TOTAL) BY MOUTH DAILY.     Fish Oil 1000 MG Caps  Take 1,000 mg by mouth daily.     lisinopril 40 MG tablet  Commonly known as:  PRINIVIL,ZESTRIL  TAKE 1 TABLET (40 MG TOTAL) BY MOUTH DAILY.     METAMUCIL PO  Take 1 scoop by mouth every  morning.     metoCLOPramide 10 MG tablet  Commonly known as:  REGLAN  Take 1 tablet (10 mg total) by mouth every 8 (eight) hours as needed for nausea or vomiting.     nitrofurantoin 50 MG capsule  Commonly known as:  MACRODANTIN  TAKE ONE CAPSULE EVERY DAY     Oxcarbazepine 300 MG tablet  Commonly known as:  TRILEPTAL  Take 3 tablets (900 mg total) by mouth 2 (two) times daily.     tamsulosin 0.4 MG Caps capsule  Commonly known as:  FLOMAX  TAKE 1 CAPSULE BY MOUTH DAILY     timolol 0.5 % ophthalmic solution  Commonly known as:  TIMOPTIC  Place 1 drop into the left eye 2 (two) times daily.     XARELTO 20 MG Tabs tablet  Generic drug:  rivaroxaban  Take 1 tablet by mouth every evening.           Follow-up Information    Follow up with Kalyn Dimattia L, MD In 1 week.   Specialty:  Neurosurgery   Why:  call the office to make an appointment   Contact information:   [address] [place] [address] [phone]       Signed: Winfield Cunas 05/13/2015, 7:40 PM

## 2015-05-13 NOTE — Progress Notes (Signed)
Discharged instructions given to pt and family. Pt alert and oriented, no discomfort noted. Pt and family expressed understanding of discharged instructions, papers given to pt. Pt taken downstairs in a wheelchair by CNA.

## 2015-05-15 NOTE — Telephone Encounter (Addendum)
Noted, thanks.  Glad his son is with him.   If his cognition isn't improving, then please get him an OV.  Thanks.

## 2015-05-15 NOTE — Telephone Encounter (Signed)
Noted. Thanks.

## 2015-05-15 NOTE — Telephone Encounter (Signed)
Spoke with patient this morning, at home and he still doesn't sound like he is fully "with it".  In fact, he admits that he hasn't been cognitive for a week and a half.  I asked if he had someone to help him and he says his son is with him.  I asked if he had gotten my letter that I mailed with his written instructions and he said he hadn't even been to the mailbox.  I instructed him to call back if he did not have the letter so that I could explain the instructions to his son.

## 2015-05-15 NOTE — Telephone Encounter (Signed)
Spoke with patient again.  He says he did receive the letter that I mailed with his pre-surgical instructions written out and he states he understands the instructions.  I offered an appt with Dr. Damita Dunnings for his lack of cognition for the last week and a half and he says he seems to be a little better today and he thinks it is from his previous surgery.  Patient states he will call in if he feels that he needs the appointment.

## 2015-05-19 ENCOUNTER — Encounter: Payer: Self-pay | Admitting: Family Medicine

## 2015-05-19 ENCOUNTER — Ambulatory Visit (INDEPENDENT_AMBULATORY_CARE_PROVIDER_SITE_OTHER): Payer: Medicare Other | Admitting: Family Medicine

## 2015-05-19 VITALS — BP 106/74 | HR 81 | Temp 97.6°F | Wt 181.2 lb

## 2015-05-19 DIAGNOSIS — G500 Trigeminal neuralgia: Secondary | ICD-10-CM | POA: Diagnosis not present

## 2015-05-19 NOTE — Progress Notes (Signed)
Pre visit review using our clinic review tool, if applicable. No additional management support is needed unless otherwise documented below in the visit note.  Trig neuralgia f/u.  S/p L sided craniotomy.   R eye patched, still with some pain on the R side of the face but not trigeminal type of pain.  No FCNAVD.  Eating well.   Still with L sided facial numbness as expected but had to patch his R eye due to vertical diplopia.  This was present in the hospital.   No L sided facial pain.    He is going to put off his urology surgery for now, d/w pt about plan re: anticoagulation.  He'll continue as is for now.  He isn't on lovenox.    Meds, vitals, and allergies reviewed.   ROS: See HPI.  Otherwise, noncontributory.  GEN: nad, alert and oriented HEENT: mucous membranes moist, PERRL, EOMI, L craniotomy wound healing well NECK: supple w/o LA CV: rrr.  no murmur PULM: ctab, no inc wob ABD: soft, +bs EXT: no edema CN 2-12 wnl B, S/S/DTR wnl x4 except for L facial numbness as expected.

## 2015-05-19 NOTE — Patient Instructions (Signed)
I think the double vision will gradually improve in the next few weeks.  I would still use the patch in the meantime.   If it isn't better in the next 3-4 weeks or if worse in the meantime, then let me or Dr. Christella Noa know so we can set up a CAT scan.  I think this will gradually improve.  Take care.  Glad to see you.

## 2015-05-20 NOTE — Assessment & Plan Note (Addendum)
Now with no L sided pain but vertical diplopia noted.  I called Dr. Christella Noa.  We both think this is a likely transient issue that will resolve with time.  See AVS. Still okay for outpatient f/u.  If worsening diplopia, then he'll update me.  >25 minutes spent in face to face time with patient, >50% spent in counselling or coordination of care.  Patient will call uro about putting off his planned procedure, until his diplopia is resolved.

## 2015-05-23 ENCOUNTER — Encounter (HOSPITAL_COMMUNITY): Admission: RE | Payer: Self-pay | Source: Ambulatory Visit

## 2015-05-23 ENCOUNTER — Ambulatory Visit (HOSPITAL_COMMUNITY): Admission: RE | Admit: 2015-05-23 | Payer: Medicare Other | Source: Ambulatory Visit | Admitting: Urology

## 2015-05-23 SURGERY — TURP (TRANSURETHRAL RESECTION OF PROSTATE)
Anesthesia: General

## 2015-05-28 DIAGNOSIS — R339 Retention of urine, unspecified: Secondary | ICD-10-CM | POA: Diagnosis not present

## 2015-05-29 ENCOUNTER — Ambulatory Visit
Admission: RE | Admit: 2015-05-29 | Discharge: 2015-05-29 | Disposition: A | Discharge status: Home / Self Care | Payer: Medicare Other | Source: Ambulatory Visit | Attending: Interventional Radiology | Admitting: Interventional Radiology

## 2015-05-29 DIAGNOSIS — I82422 Acute embolism and thrombosis of left iliac vein: Secondary | ICD-10-CM

## 2015-05-29 DIAGNOSIS — Z86718 Personal history of other venous thrombosis and embolism: Secondary | ICD-10-CM | POA: Diagnosis not present
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
## 2015-06-02 DIAGNOSIS — S62635D Displaced fracture of distal phalanx of left ring finger, subsequent encounter for fracture with routine healing: Secondary | ICD-10-CM | POA: Diagnosis not present

## 2015-06-02 DIAGNOSIS — S6722XD Crushing injury of left hand, subsequent encounter: Secondary | ICD-10-CM | POA: Diagnosis not present

## 2015-06-05 DIAGNOSIS — Z85828 Personal history of other malignant neoplasm of skin: Secondary | ICD-10-CM | POA: Diagnosis not present

## 2015-06-05 DIAGNOSIS — B0089 Other herpesviral infection: Secondary | ICD-10-CM | POA: Diagnosis not present

## 2015-06-05 DIAGNOSIS — B009 Herpesviral infection, unspecified: Secondary | ICD-10-CM | POA: Diagnosis not present

## 2015-06-09 ENCOUNTER — Inpatient Hospital Stay: Payer: Medicare Other | Attending: Hematology and Oncology | Admitting: Hematology and Oncology

## 2015-06-09 ENCOUNTER — Other Ambulatory Visit: Payer: Self-pay | Admitting: Family Medicine

## 2015-06-09 VITALS — BP 145/88 | HR 61 | Temp 97.4°F | Resp 18 | Ht 72.0 in | Wt 189.7 lb

## 2015-06-09 DIAGNOSIS — K219 Gastro-esophageal reflux disease without esophagitis: Secondary | ICD-10-CM

## 2015-06-09 DIAGNOSIS — R200 Anesthesia of skin: Secondary | ICD-10-CM

## 2015-06-09 DIAGNOSIS — Z7901 Long term (current) use of anticoagulants: Secondary | ICD-10-CM | POA: Diagnosis not present

## 2015-06-09 DIAGNOSIS — D6851 Activated protein C resistance: Secondary | ICD-10-CM | POA: Diagnosis not present

## 2015-06-09 DIAGNOSIS — Z86718 Personal history of other venous thrombosis and embolism: Secondary | ICD-10-CM | POA: Diagnosis not present

## 2015-06-09 DIAGNOSIS — Z85828 Personal history of other malignant neoplasm of skin: Secondary | ICD-10-CM

## 2015-06-09 DIAGNOSIS — I10 Essential (primary) hypertension: Secondary | ICD-10-CM | POA: Diagnosis not present

## 2015-06-09 DIAGNOSIS — Z87891 Personal history of nicotine dependence: Secondary | ICD-10-CM | POA: Diagnosis not present

## 2015-06-09 DIAGNOSIS — H532 Diplopia: Secondary | ICD-10-CM | POA: Diagnosis not present

## 2015-06-09 DIAGNOSIS — Z7982 Long term (current) use of aspirin: Secondary | ICD-10-CM | POA: Diagnosis not present

## 2015-06-09 DIAGNOSIS — Z79899 Other long term (current) drug therapy: Secondary | ICD-10-CM

## 2015-06-09 DIAGNOSIS — I871 Compression of vein: Secondary | ICD-10-CM

## 2015-06-09 NOTE — Telephone Encounter (Signed)
Electronic refill request. Last Filled:    30 capsule 2 03/03/2015  Last office visit:   05/19/15   Please advise.
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
## 2015-06-09 NOTE — Telephone Encounter (Signed)
Sent. Thanks.   

## 2015-06-12 DIAGNOSIS — G500 Trigeminal neuralgia: Secondary | ICD-10-CM | POA: Diagnosis not present

## 2015-06-13 DIAGNOSIS — G500 Trigeminal neuralgia: Secondary | ICD-10-CM | POA: Diagnosis not present

## 2015-06-14 DIAGNOSIS — H401124 Primary open-angle glaucoma, left eye, indeterminate stage: Secondary | ICD-10-CM | POA: Diagnosis not present

## 2015-06-14 DIAGNOSIS — H401121 Primary open-angle glaucoma, left eye, mild stage: Secondary | ICD-10-CM | POA: Diagnosis not present

## 2015-06-14 DIAGNOSIS — H40001 Preglaucoma, unspecified, right eye: Secondary | ICD-10-CM | POA: Diagnosis not present

## 2015-06-19 ENCOUNTER — Encounter: Payer: Self-pay | Admitting: Family Medicine

## 2015-06-19 ENCOUNTER — Ambulatory Visit (INDEPENDENT_AMBULATORY_CARE_PROVIDER_SITE_OTHER): Payer: Medicare Other | Admitting: Family Medicine

## 2015-06-19 VITALS — BP 142/84 | HR 62 | Temp 97.7°F | Wt 192.0 lb

## 2015-06-19 DIAGNOSIS — H532 Diplopia: Secondary | ICD-10-CM

## 2015-06-19 MED ORDER — BACLOFEN 20 MG PO TABS: 20.0000 mg | ORAL_TABLET | Freq: Two times a day (BID) | ORAL | Status: DC

## 2015-06-19 NOTE — Progress Notes (Signed)
Pre visit review using our clinic review tool, if applicable. No additional management support is needed unless otherwise documented below in the visit note.  He has seen Dr. Christella Noa for post op visits.  Patient was asking about a second opinion.    He still has double vision when his head is above horizontal gaze.  Less sx with looking down with his head and upward deviation of the eyes.  This is some better than prev, overall.    Last MRI was done 1 week ago.    L ear feels stuffy w/o cracking and popping.  Can't clear L TM with valsalva.  L ear hearing is muffled.  L nostril stuffier than R side.   Meds, vitals, and allergies reviewed.   ROS: See HPI.  Otherwise, noncontributory.  GEN: nad, alert and oriented HEENT: mucous membranes moist, tm w/o erythema but no L TM movement on valsalva, nasal exam w/o erythema, clear discharge noted,  OP with cobblestoning, dec L facial sensation at baseline  NECK: supple w/o LA CV: rrr.   PULM: ctab, no inc wob EXT: no edema CN 2-12 wnl B, S/S/DTR wnl x4, except for dec L facial sensation at baseline as expected.

## 2015-06-19 NOTE — Patient Instructions (Signed)
Likely eustachian tube dysfunction on the left ear.  Gently try to pop your ears.  Use nasal saline twice a day and start taking claritin 10mg  a day.  I'll check with Dr. Christella Noa in the meantime.  I'll await the eye appointment notes from [facility].  Take care.  Glad to see you.

## 2015-06-23 DIAGNOSIS — H532 Diplopia: Secondary | ICD-10-CM | POA: Insufficient documentation

## 2015-06-23 NOTE — Assessment & Plan Note (Signed)
Post op, should slowly resolve, is some better.   I'll check with Dr. Christella Noa in the meantime.  I'll await the eye appointment notes from [facility]- he has f/u pending.    Likely incidental eustachian tube dysfunction on the left ear.  Gently try to pop the ears. Use nasal saline twice a day and start taking claritin 10mg  a day.  >25 minutes spent in face to face time with patient, >50% spent in counselling or coordination of care.

## 2015-06-24 ENCOUNTER — Telehealth: Payer: Self-pay | Admitting: Family Medicine

## 2015-06-24 NOTE — Telephone Encounter (Signed)
Patient notified and CD left at front desk for pick up.

## 2015-06-24 NOTE — Telephone Encounter (Signed)
I tried to get the patient's CD to load with the MRI.  I wouldn't get it to open on our computer system.  Called Dr. Lacy Duverney office for advice, left message with his staff.   Will await return call.  Please notify pt to pick up CD.  Thanks.

## 2015-06-29 ENCOUNTER — Encounter: Payer: Self-pay | Admitting: Hematology and Oncology

## 2015-06-30 DIAGNOSIS — R339 Retention of urine, unspecified: Secondary | ICD-10-CM | POA: Diagnosis not present

## 2015-07-01 ENCOUNTER — Other Ambulatory Visit: Payer: Self-pay | Admitting: Family Medicine

## 2015-07-03 DIAGNOSIS — H4912 Fourth [trochlear] nerve palsy, left eye: Secondary | ICD-10-CM | POA: Diagnosis not present

## 2015-07-03 DIAGNOSIS — I10 Essential (primary) hypertension: Secondary | ICD-10-CM | POA: Diagnosis not present

## 2015-07-03 DIAGNOSIS — H401121 Primary open-angle glaucoma, left eye, mild stage: Secondary | ICD-10-CM | POA: Diagnosis not present

## 2015-07-03 DIAGNOSIS — Z7982 Long term (current) use of aspirin: Secondary | ICD-10-CM | POA: Diagnosis not present

## 2015-07-03 DIAGNOSIS — Z888 Allergy status to other drugs, medicaments and biological substances status: Secondary | ICD-10-CM | POA: Diagnosis not present

## 2015-07-03 DIAGNOSIS — H02403 Unspecified ptosis of bilateral eyelids: Secondary | ICD-10-CM | POA: Diagnosis not present

## 2015-07-03 DIAGNOSIS — Z885 Allergy status to narcotic agent status: Secondary | ICD-10-CM | POA: Diagnosis not present

## 2015-07-03 DIAGNOSIS — Z79899 Other long term (current) drug therapy: Secondary | ICD-10-CM | POA: Diagnosis not present

## 2015-07-03 DIAGNOSIS — R200 Anesthesia of skin: Secondary | ICD-10-CM | POA: Diagnosis not present

## 2015-07-03 DIAGNOSIS — H40001 Preglaucoma, unspecified, right eye: Secondary | ICD-10-CM | POA: Diagnosis not present

## 2015-07-04 DIAGNOSIS — Z4789 Encounter for other orthopedic aftercare: Secondary | ICD-10-CM | POA: Diagnosis not present

## 2015-07-08 ENCOUNTER — Other Ambulatory Visit: Payer: Self-pay | Admitting: *Deleted

## 2015-07-08 DIAGNOSIS — R9389 Abnormal findings on diagnostic imaging of other specified body structures: Secondary | ICD-10-CM

## 2015-07-10 ENCOUNTER — Other Ambulatory Visit: Payer: Self-pay | Admitting: Family Medicine

## 2015-07-12 ENCOUNTER — Other Ambulatory Visit: Payer: Self-pay | Admitting: Family Medicine

## 2015-07-14 DIAGNOSIS — R3912 Poor urinary stream: Secondary | ICD-10-CM | POA: Diagnosis not present

## 2015-07-14 DIAGNOSIS — R350 Frequency of micturition: Secondary | ICD-10-CM | POA: Diagnosis not present

## 2015-07-14 DIAGNOSIS — R338 Other retention of urine: Secondary | ICD-10-CM | POA: Diagnosis not present

## 2015-07-14 DIAGNOSIS — N401 Enlarged prostate with lower urinary tract symptoms: Secondary | ICD-10-CM | POA: Diagnosis not present

## 2015-07-14 DIAGNOSIS — N138 Other obstructive and reflux uropathy: Secondary | ICD-10-CM | POA: Diagnosis not present

## 2015-07-14 DIAGNOSIS — N323 Diverticulum of bladder: Secondary | ICD-10-CM | POA: Diagnosis not present

## 2015-07-15 ENCOUNTER — Other Ambulatory Visit: Payer: Self-pay

## 2015-07-15 MED ORDER — XARELTO 20 MG PO TABS: 20.0000 mg | ORAL_TABLET | Freq: Every evening | ORAL | Status: DC

## 2015-07-15 NOTE — Telephone Encounter (Signed)
Pt request refill xarelto to [facility]; pt said has been taking for 1 yr and now out of med; on pts hx med list xarelto refilled # 30 x 2 on 06/03/14. Pt saw Dr Damita Dunnings on 06/19/15. Dr Damita Dunnings out of office but spoke with [facility] CMA and she advised to refill # 30 and then next time send refill request to Dr Damita Dunnings. Pt will ck with pharmacy.

## 2015-07-21 ENCOUNTER — Other Ambulatory Visit: Payer: Self-pay | Admitting: Urology

## 2015-07-28 DIAGNOSIS — R339 Retention of urine, unspecified: Secondary | ICD-10-CM | POA: Diagnosis not present

## 2015-08-06 ENCOUNTER — Other Ambulatory Visit: Payer: Self-pay | Admitting: Family Medicine

## 2015-08-06 ENCOUNTER — Ambulatory Visit (INDEPENDENT_AMBULATORY_CARE_PROVIDER_SITE_OTHER): Payer: Medicare Other | Admitting: Neurology

## 2015-08-06 ENCOUNTER — Encounter: Payer: Self-pay | Admitting: Neurology

## 2015-08-06 VITALS — BP 152/86 | HR 58 | Ht 72.0 in | Wt 196.0 lb

## 2015-08-06 DIAGNOSIS — G500 Trigeminal neuralgia: Secondary | ICD-10-CM

## 2015-08-06 DIAGNOSIS — R208 Other disturbances of skin sensation: Secondary | ICD-10-CM

## 2015-08-06 DIAGNOSIS — IMO0001 Reserved for inherently not codable concepts without codable children: Secondary | ICD-10-CM

## 2015-08-06 DIAGNOSIS — R200 Anesthesia of skin: Secondary | ICD-10-CM

## 2015-08-06 DIAGNOSIS — R030 Elevated blood-pressure reading, without diagnosis of hypertension: Secondary | ICD-10-CM

## 2015-08-06 MED ORDER — OXCARBAZEPINE 300 MG PO TABS: ORAL_TABLET | ORAL | Status: DC

## 2015-08-06 MED ORDER — GABAPENTIN 300 MG PO CAPS: ORAL_CAPSULE | ORAL | Status: DC

## 2015-08-06 NOTE — Patient Instructions (Signed)
The facial numbness is a side effect of the surgery. 1.  Stop baclofen 2.  We will slowly get off of oxcarbazepine.  Take 2 pills twice daily for 7 days, then 1 pill twice daily for 7 days, then stop.  If you experience recurrence of shooting pain, contact us and we may need to go back up. 3.  Once you are off oxcarbazepine, we will start gabapentin to try and treat the burning pain.  Take 1 capsule at bedtime for 7 days, then 1 capsule twice daily for 7 days, then 1 capsule three times daily.  If a lower dose takes care of the burning, you may remain at that dose and not increase it. 4.  Follow up with Dr. Christella Noa.  5.  Follow up with me in 4 months.
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
## 2015-08-11 ENCOUNTER — Telehealth: Payer: Self-pay | Admitting: *Deleted

## 2015-08-11 ENCOUNTER — Other Ambulatory Visit: Payer: Self-pay | Admitting: Family Medicine

## 2015-08-11 NOTE — Telephone Encounter (Signed)
PA Form received for Nitrofurantoin from [place].  Form placed in Dr. Buckner Malta In [place].

## 2015-08-11 NOTE — Telephone Encounter (Signed)
Electronic refill request. Last Filled:     30 tablet 0 07/15/2015  Please advise.

## 2015-08-11 NOTE — Telephone Encounter (Signed)
I'll work on the hard copy.  Thanks.  

## 2015-08-11 NOTE — Telephone Encounter (Signed)
Sent. Thanks.   

## 2015-08-12 ENCOUNTER — Encounter: Payer: Self-pay | Admitting: Family Medicine

## 2015-08-12 DIAGNOSIS — N390 Urinary tract infection, site not specified: Secondary | ICD-10-CM | POA: Insufficient documentation

## 2015-08-12 NOTE — Telephone Encounter (Signed)
Lattie Haw with [employer] PA left v/m requesting additional clinical information for pt. Request cb.

## 2015-08-12 NOTE — Telephone Encounter (Signed)
Hard copy is in Dr. Buckner Malta In Box to fill out.

## 2015-08-12 NOTE — Telephone Encounter (Signed)
Hard copy filled out by Dr. Damita Dunnings and faxed to [facility].

## 2015-08-12 NOTE — Telephone Encounter (Signed)
Porsha with [employer] left v/m that nitrofurantoin was approved for 1 year from 08/11/2015. Approval letter to follow.

## 2015-08-19 NOTE — Telephone Encounter (Signed)
Letter of approval received, signed, faxed to pharmacy and scanned.

## 2015-08-22 ENCOUNTER — Encounter: Payer: Self-pay | Admitting: Family Medicine

## 2015-08-22 ENCOUNTER — Ambulatory Visit (INDEPENDENT_AMBULATORY_CARE_PROVIDER_SITE_OTHER): Payer: Medicare Other | Admitting: Family Medicine

## 2015-08-22 VITALS — BP 118/68 | HR 73 | Temp 98.1°F | Wt 185.0 lb

## 2015-08-22 DIAGNOSIS — G500 Trigeminal neuralgia: Secondary | ICD-10-CM | POA: Diagnosis not present

## 2015-08-22 DIAGNOSIS — N390 Urinary tract infection, site not specified: Secondary | ICD-10-CM | POA: Diagnosis not present

## 2015-08-22 MED ORDER — LISINOPRIL 40 MG PO TABS: ORAL_TABLET | ORAL | Status: DC

## 2015-08-22 MED ORDER — CIPROFLOXACIN HCL 500 MG PO TABS: 500.0000 mg | ORAL_TABLET | Freq: Two times a day (BID) | ORAL | Status: DC

## 2015-08-22 MED ORDER — NITROFURANTOIN MACROCRYSTAL 50 MG PO CAPS: 50.0000 mg | ORAL_CAPSULE | Freq: Every day | ORAL | Status: DC

## 2015-08-22 MED ORDER — GABAPENTIN 300 MG PO CAPS: ORAL_CAPSULE | ORAL | Status: DC

## 2015-08-22 NOTE — Progress Notes (Signed)
Pre visit review using our clinic review tool, if applicable. No additional management support is needed unless otherwise documented below in the visit note.  Dysuria.  Still on xarelto, with plan to change to lovenox in the near future for TURP.  He is going to have 7 days of lovenox before the surgery.   Ran out of macrobid and had trouble getting rx filled, though PA prev done.   Had dysuria recently, when off abx, then improved on cipro.   No dysuria now.  No FCNAVD Still having to do BID or TID self cath.    TN- off trileptal now with the plan to start gabapentin today per neurology.  He has f/u with Dr. Christella Noa pending for next week.   His prev pain is better now but still has burning and numbness in the L side of the face.   Double vision is totally resolved, in the last 2 weeks.  No double vision now.   Meds, vitals, and allergies reviewed.   ROS: Per HPI unless specifically indicated in ROS section   GEN: nad, alert and oriented HEENT: mucous membranes moist NECK: supple w/o LA CV: rrr PULM: ctab, no inc wob ABD: soft, +bs EXT: no edema CN 2-12 wnl B, S/S/DTR wnl x4 except for altered sensation on the L side of face.

## 2015-08-22 NOTE — Patient Instructions (Signed)
Restart nitrofurantoin.  Start the gabapentin and I'll await the notes from urology and neurosurgery.  Take care.  Glad to see you.

## 2015-08-24 NOTE — Assessment & Plan Note (Signed)
>  25 minutes spent in face to face time with patient, >50% spent in counselling or coordination of care His prev pain is better now but still has burning and numbness in the L side of the face.  Double vision is totally resolved, in the last 2 weeks. No double vision now.  He'll f/u with neurosurgery and I'll await input.

## 2015-08-24 NOTE — Assessment & Plan Note (Signed)
No sx now.  Still on xarelto, with plan to change to lovenox in the near future for TURP. He is going to have 7 days of lovenox before the surgery.  No dysuria now. No FCNAVD  Still having to do BID or TID self cath.  Continue suppressive tx for now.   He agrees .

## 2015-08-25 NOTE — Patient Instructions (Signed)
[name]  08/25/2015   Your procedure is scheduled on: 09/04/2015    Report to [facility] Main  Entrance take [place]  elevators to 3rd floor to  [place] at   [place] AM.  Call this number if you have problems the morning of surgery [phone]   Remember: ONLY 1 PERSON MAY GO WITH YOU TO SHORT STAY TO GET  READY MORNING OF [place].  Do not eat food or drink liquids :After Midnight.     Take these medicines the morning of surgery with A SIP OF WATER: Proscar, Flomax, Timolol eye drops                                 You may not have any metal on your body including hair pins and              piercings  Do not wear jewelry,  lotions, powders or perfumes, deodorant                          Men may shave face and neck.   Do not bring valuables to the hospital. [place].  Contacts, dentures or bridgework may not be worn into surgery.  Leave suitcase in the car. After surgery it may be brought to your room.        Special Instructions: coughing and deep breathing exercises, leg exercises               Please read over the following fact sheets you were given: _____________________________________________________________________             [facility] - Preparing for Surgery Before surgery, you can play an important role.  Because skin is not sterile, your skin needs to be as free of germs as possible.  You can reduce the number of germs on your skin by washing with CHG (chlorahexidine gluconate) soap before surgery.  CHG is an antiseptic cleaner which kills germs and bonds with the skin to continue killing germs even after washing. Please DO NOT use if you have an allergy to CHG or antibacterial soaps.  If your skin becomes reddened/irritated stop using the CHG and inform your nurse when you arrive at Short Stay. Do not shave (including legs and underarms) for at least 48 hours prior to the  first CHG shower.  You may shave your face/neck. Please follow these instructions carefully:  1.  Shower with CHG Soap the night before surgery and the  morning of Surgery.  2.  If you choose to wash your hair, wash your hair first as usual with your  normal  shampoo.  3.  After you shampoo, rinse your hair and body thoroughly to remove the  shampoo.                           4.  Use CHG as you would any other liquid soap.  You can apply chg directly  to the skin and wash                       Gently with a scrungie or clean washcloth.  5.  Apply the CHG Soap to your body ONLY FROM THE NECK DOWN.   Do not use on face/ open                           Wound or open sores. Avoid contact with eyes, ears mouth and genitals (private parts).                       Wash face,  Genitals (private parts) with your normal soap.             6.  Wash thoroughly, paying special attention to the area where your surgery  will be performed.  7.  Thoroughly rinse your body with warm water from the neck down.  8.  DO NOT shower/wash with your normal soap after using and rinsing off  the CHG Soap.                9.  Pat yourself dry with a clean towel.            10.  Wear clean pajamas.            11.  Place clean sheets on your bed the night of your first shower and do not  sleep with pets. Day of Surgery : Do not apply any lotions/deodorants the morning of surgery.  Please wear clean clothes to the hospital/surgery center.  FAILURE TO FOLLOW THESE INSTRUCTIONS MAY RESULT IN THE CANCELLATION OF YOUR SURGERY PATIENT SIGNATURE_________________________________  NURSE SIGNATURE__________________________________  ________________________________________________________________________

## 2015-08-26 DIAGNOSIS — G500 Trigeminal neuralgia: Secondary | ICD-10-CM | POA: Diagnosis not present

## 2015-08-26 DIAGNOSIS — Z6825 Body mass index (BMI) 25.0-25.9, adult: Secondary | ICD-10-CM | POA: Diagnosis not present

## 2015-08-27 ENCOUNTER — Encounter (HOSPITAL_COMMUNITY): Payer: Self-pay

## 2015-08-27 ENCOUNTER — Encounter (HOSPITAL_COMMUNITY)
Admission: RE | Admit: 2015-08-27 | Discharge: 2015-08-27 | Disposition: A | Discharge status: Home / Self Care | Payer: Medicare Other | Source: Ambulatory Visit | Attending: Urology | Admitting: Urology

## 2015-08-27 DIAGNOSIS — N400 Enlarged prostate without lower urinary tract symptoms: Secondary | ICD-10-CM | POA: Insufficient documentation

## 2015-08-27 DIAGNOSIS — Z01812 Encounter for preprocedural laboratory examination: Secondary | ICD-10-CM | POA: Insufficient documentation

## 2015-08-27 LAB — BASIC METABOLIC PANEL
ANION GAP: 6 (ref 5–15)
BUN: 14 mg/dL (ref 6–20)
CALCIUM: 9.1 mg/dL (ref 8.9–10.3)
CO2: 28 mmol/L (ref 22–32)
Chloride: 108 mmol/L (ref 101–111)
Creatinine, Ser: 0.99 mg/dL (ref 0.61–1.24)
Glucose, Bld: 92 mg/dL (ref 65–99)
Potassium: 4.4 mmol/L (ref 3.5–5.1)
Sodium: 142 mmol/L (ref 135–145)

## 2015-08-27 LAB — CBC
HCT: 42.1 % (ref 39.0–52.0)
Hemoglobin: 14 g/dL (ref 13.0–17.0)
MCH: 28.2 pg (ref 26.0–34.0)
MCHC: 33.3 g/dL (ref 30.0–36.0)
MCV: 84.9 fL (ref 78.0–100.0)
PLATELETS: 245 10*3/uL (ref 150–400)
RBC: 4.96 MIL/uL (ref 4.22–5.81)
RDW: 12.6 % (ref 11.5–15.5)
WBC: 3.7 10*3/uL — AB (ref 4.0–10.5)

## 2015-08-27 LAB — PROTIME-INR
INR: 1.65 — AB (ref 0.00–1.49)
PROTHROMBIN TIME: 19.5 s — AB (ref 11.6–15.2)

## 2015-08-27 NOTE — Progress Notes (Addendum)
EKG-05/15/15- EPIC  CXR- 12/06/14- EPIC  LOV- PCP-08/22/15- EPIC

## 2015-08-27 NOTE — Progress Notes (Signed)
Pt/INR done 08/27/2015 faxed via EPIC to Dr Gaynelle Arabian.

## 2015-09-02 DIAGNOSIS — R339 Retention of urine, unspecified: Secondary | ICD-10-CM | POA: Diagnosis not present
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
## 2015-09-04 ENCOUNTER — Encounter (HOSPITAL_COMMUNITY)
Admission: RE | Disposition: A | Discharge status: Home / Self Care | Payer: Self-pay | Source: Ambulatory Visit | Attending: Urology

## 2015-09-04 ENCOUNTER — Ambulatory Visit (HOSPITAL_COMMUNITY): Payer: Medicare Other | Admitting: Anesthesiology

## 2015-09-04 ENCOUNTER — Encounter (HOSPITAL_COMMUNITY): Payer: Self-pay

## 2015-09-04 ENCOUNTER — Observation Stay (HOSPITAL_COMMUNITY)
Admission: RE | Admit: 2015-09-04 | Discharge: 2015-09-06 | Disposition: A | Discharge status: Home / Self Care | Payer: Medicare Other | Source: Ambulatory Visit | Attending: Urology | Admitting: Urology

## 2015-09-04 DIAGNOSIS — Z87891 Personal history of nicotine dependence: Secondary | ICD-10-CM | POA: Diagnosis not present

## 2015-09-04 DIAGNOSIS — E119 Type 2 diabetes mellitus without complications: Secondary | ICD-10-CM | POA: Diagnosis not present

## 2015-09-04 DIAGNOSIS — N138 Other obstructive and reflux uropathy: Secondary | ICD-10-CM | POA: Insufficient documentation

## 2015-09-04 DIAGNOSIS — K219 Gastro-esophageal reflux disease without esophagitis: Secondary | ICD-10-CM | POA: Insufficient documentation

## 2015-09-04 DIAGNOSIS — R350 Frequency of micturition: Secondary | ICD-10-CM | POA: Insufficient documentation

## 2015-09-04 DIAGNOSIS — Z8042 Family history of malignant neoplasm of prostate: Secondary | ICD-10-CM | POA: Diagnosis not present

## 2015-09-04 DIAGNOSIS — R338 Other retention of urine: Secondary | ICD-10-CM | POA: Diagnosis not present

## 2015-09-04 DIAGNOSIS — M1990 Unspecified osteoarthritis, unspecified site: Secondary | ICD-10-CM | POA: Diagnosis not present

## 2015-09-04 DIAGNOSIS — I10 Essential (primary) hypertension: Secondary | ICD-10-CM | POA: Diagnosis not present

## 2015-09-04 DIAGNOSIS — R3912 Poor urinary stream: Secondary | ICD-10-CM | POA: Insufficient documentation

## 2015-09-04 DIAGNOSIS — N359 Urethral stricture, unspecified: Secondary | ICD-10-CM | POA: Insufficient documentation

## 2015-09-04 DIAGNOSIS — N401 Enlarged prostate with lower urinary tract symptoms: Principal | ICD-10-CM | POA: Insufficient documentation

## 2015-09-04 DIAGNOSIS — N400 Enlarged prostate without lower urinary tract symptoms: Secondary | ICD-10-CM | POA: Diagnosis present

## 2015-09-04 DIAGNOSIS — Z89421 Acquired absence of other right toe(s): Secondary | ICD-10-CM | POA: Diagnosis not present

## 2015-09-04 DIAGNOSIS — Z7901 Long term (current) use of anticoagulants: Secondary | ICD-10-CM | POA: Insufficient documentation

## 2015-09-04 DIAGNOSIS — I4891 Unspecified atrial fibrillation: Secondary | ICD-10-CM | POA: Diagnosis not present

## 2015-09-04 DIAGNOSIS — Z86718 Personal history of other venous thrombosis and embolism: Secondary | ICD-10-CM | POA: Diagnosis not present

## 2015-09-04 DIAGNOSIS — N323 Diverticulum of bladder: Secondary | ICD-10-CM | POA: Insufficient documentation

## 2015-09-04 HISTORY — PX: TRANSURETHRAL RESECTION OF PROSTATE: SHX73

## 2015-09-04 HISTORY — DX: Dizziness and giddiness: R42

## 2015-09-04 LAB — PROTIME-INR
INR: 1.13 (ref 0.00–1.49)
PROTHROMBIN TIME: 14.3 s (ref 11.6–15.2)

## 2015-09-04 SURGERY — TURP (TRANSURETHRAL RESECTION OF PROSTATE)
Anesthesia: General

## 2015-09-04 MED ORDER — LISINOPRIL 20 MG PO TABS
40.0000 mg | ORAL_TABLET | Freq: Every day | ORAL | Status: DC
  Administered 2015-09-05: 40 mg via ORAL
  Filled 2015-09-04 (×2): qty 2

## 2015-09-04 MED ORDER — LACTATED RINGERS IV SOLN
INTRAVENOUS | Status: DC | PRN
  Administered 2015-09-04: 07:00:00 via INTRAVENOUS

## 2015-09-04 MED ORDER — DIPHENHYDRAMINE HCL 12.5 MG/5ML PO ELIX: 12.5000 mg | ORAL_SOLUTION | Freq: Four times a day (QID) | ORAL | Status: DC | PRN

## 2015-09-04 MED ORDER — ENOXAPARIN SODIUM 80 MG/0.8ML ~~LOC~~ SOLN
80.0000 mg | Freq: Two times a day (BID) | SUBCUTANEOUS | Status: DC
  Administered 2015-09-04 – 2015-09-05 (×4): 80 mg via SUBCUTANEOUS
  Filled 2015-09-04 (×3): qty 0.8

## 2015-09-04 MED ORDER — LACTATED RINGERS IV SOLN: INTRAVENOUS | Status: DC

## 2015-09-04 MED ORDER — OXYCODONE HCL 5 MG PO TABS: 5.0000 mg | ORAL_TABLET | Freq: Once | ORAL | Status: DC | PRN

## 2015-09-04 MED ORDER — PSYLLIUM 95 % PO PACK
1.0000 | PACK | Freq: Every day | ORAL | Status: DC
  Administered 2015-09-04 – 2015-09-05 (×2): 1 via ORAL
  Filled 2015-09-04 (×3): qty 1

## 2015-09-04 MED ORDER — BELLADONNA ALKALOIDS-OPIUM 16.2-60 MG RE SUPP
RECTAL | Status: DC | PRN
  Administered 2015-09-04: 1 via RECTAL

## 2015-09-04 MED ORDER — BACITRACIN-NEOMYCIN-POLYMYXIN 400-5-5000 EX OINT: 1.0000 "application " | TOPICAL_OINTMENT | Freq: Three times a day (TID) | CUTANEOUS | Status: DC | PRN

## 2015-09-04 MED ORDER — DEXAMETHASONE SODIUM PHOSPHATE 4 MG/ML IJ SOLN
INTRAMUSCULAR | Status: DC | PRN
Start: 2015-09-04 — End: 2015-09-04
  Administered 2015-09-04: 10 mg via INTRAVENOUS

## 2015-09-04 MED ORDER — LIDOCAINE HCL 2 % EX GEL
CUTANEOUS | Status: AC
  Filled 2015-09-04: qty 5

## 2015-09-04 MED ORDER — ONDANSETRON HCL 4 MG/2ML IJ SOLN
4.0000 mg | INTRAMUSCULAR | Status: DC | PRN
  Administered 2015-09-05: 4 mg via INTRAVENOUS
  Filled 2015-09-04: qty 2

## 2015-09-04 MED ORDER — MEPERIDINE HCL 50 MG/ML IJ SOLN: 6.2500 mg | INTRAMUSCULAR | Status: DC | PRN

## 2015-09-04 MED ORDER — PROPOFOL 10 MG/ML IV BOLUS
INTRAVENOUS | Status: DC | PRN
  Administered 2015-09-04: 160 mg via INTRAVENOUS

## 2015-09-04 MED ORDER — ONDANSETRON HCL 4 MG/2ML IJ SOLN
INTRAMUSCULAR | Status: DC | PRN
  Administered 2015-09-04: 4 mg via INTRAVENOUS

## 2015-09-04 MED ORDER — DEXTROSE-NACL 5-0.45 % IV SOLN
INTRAVENOUS | Status: DC
  Administered 2015-09-04 (×3): via INTRAVENOUS

## 2015-09-04 MED ORDER — BELLADONNA ALKALOIDS-OPIUM 16.2-60 MG RE SUPP
RECTAL | Status: AC
  Filled 2015-09-04: qty 1

## 2015-09-04 MED ORDER — FENTANYL CITRATE (PF) 100 MCG/2ML IJ SOLN
INTRAMUSCULAR | Status: DC | PRN
  Administered 2015-09-04 (×2): 25 ug via INTRAVENOUS
  Administered 2015-09-04: 50 ug via INTRAVENOUS

## 2015-09-04 MED ORDER — CEFAZOLIN SODIUM-DEXTROSE 2-4 GM/100ML-% IV SOLN
2.0000 g | INTRAVENOUS | Status: AC
Start: 2015-09-04 — End: 2015-09-04
  Administered 2015-09-04: 2 g via INTRAVENOUS

## 2015-09-04 MED ORDER — HYDROMORPHONE HCL 1 MG/ML IJ SOLN: 0.2500 mg | INTRAMUSCULAR | Status: DC | PRN

## 2015-09-04 MED ORDER — NITROFURANTOIN MACROCRYSTAL 50 MG PO CAPS
50.0000 mg | ORAL_CAPSULE | Freq: Every day | ORAL | Status: DC
  Administered 2015-09-05: 50 mg via ORAL
  Filled 2015-09-04 (×2): qty 1

## 2015-09-04 MED ORDER — TIMOLOL MALEATE 0.5 % OP SOLN
1.0000 [drp] | Freq: Two times a day (BID) | OPHTHALMIC | Status: DC
  Administered 2015-09-04 – 2015-09-05 (×3): 1 [drp] via OPHTHALMIC
  Filled 2015-09-04: qty 5

## 2015-09-04 MED ORDER — SODIUM CHLORIDE 0.9 % IR SOLN
3000.0000 mL | Status: DC
  Administered 2015-09-04 (×3): 3000 mL

## 2015-09-04 MED ORDER — CIPROFLOXACIN HCL 500 MG PO TABS
500.0000 mg | ORAL_TABLET | Freq: Two times a day (BID) | ORAL | Status: DC
  Administered 2015-09-04 – 2015-09-05 (×4): 500 mg via ORAL
  Filled 2015-09-04 (×4): qty 1

## 2015-09-04 MED ORDER — ACETAMINOPHEN 500 MG PO TABS
ORAL_TABLET | ORAL | Status: AC
  Filled 2015-09-04: qty 1

## 2015-09-04 MED ORDER — PHENYLEPHRINE HCL 10 MG/ML IJ SOLN
INTRAMUSCULAR | Status: DC | PRN
  Administered 2015-09-04 (×3): 40 ug via INTRAVENOUS
  Administered 2015-09-04: 80 ug via INTRAVENOUS

## 2015-09-04 MED ORDER — LIDOCAINE HCL (CARDIAC) 20 MG/ML IV SOLN
INTRAVENOUS | Status: AC
  Filled 2015-09-04: qty 5

## 2015-09-04 MED ORDER — PHENYLEPHRINE 40 MCG/ML (10ML) SYRINGE FOR IV PUSH (FOR BLOOD PRESSURE SUPPORT)
PREFILLED_SYRINGE | INTRAVENOUS | Status: AC
  Filled 2015-09-04: qty 10

## 2015-09-04 MED ORDER — ACETAMINOPHEN 500 MG PO TABS
1000.0000 mg | ORAL_TABLET | Freq: Four times a day (QID) | ORAL | Status: DC | PRN
  Administered 2015-09-04: 1000 mg via ORAL

## 2015-09-04 MED ORDER — FENTANYL CITRATE (PF) 100 MCG/2ML IJ SOLN
INTRAMUSCULAR | Status: AC
  Filled 2015-09-04: qty 2

## 2015-09-04 MED ORDER — CEFAZOLIN SODIUM-DEXTROSE 2-4 GM/100ML-% IV SOLN
INTRAVENOUS | Status: AC
  Filled 2015-09-04: qty 100

## 2015-09-04 MED ORDER — ONDANSETRON HCL 4 MG/2ML IJ SOLN
INTRAMUSCULAR | Status: AC
  Filled 2015-09-04: qty 2

## 2015-09-04 MED ORDER — SODIUM CHLORIDE 0.9 % IR SOLN
Status: DC | PRN
  Administered 2015-09-04: 9000 mL via INTRAVESICAL

## 2015-09-04 MED ORDER — OXYCODONE HCL 5 MG/5ML PO SOLN: 5.0000 mg | Freq: Once | ORAL | Status: DC | PRN

## 2015-09-04 MED ORDER — SENNOSIDES-DOCUSATE SODIUM 8.6-50 MG PO TABS
2.0000 | ORAL_TABLET | Freq: Every day | ORAL | Status: DC
  Administered 2015-09-04 – 2015-09-05 (×2): 2 via ORAL
  Filled 2015-09-04 (×2): qty 2

## 2015-09-04 MED ORDER — LIDOCAINE HCL (CARDIAC) 20 MG/ML IV SOLN
INTRAVENOUS | Status: DC | PRN
  Administered 2015-09-04: 100 mg via INTRAVENOUS

## 2015-09-04 MED ORDER — GABAPENTIN 300 MG PO CAPS
300.0000 mg | ORAL_CAPSULE | Freq: Every day | ORAL | Status: DC
  Administered 2015-09-04 – 2015-09-05 (×2): 300 mg via ORAL
  Filled 2015-09-04 (×2): qty 1

## 2015-09-04 MED ORDER — ZOLPIDEM TARTRATE 5 MG PO TABS
5.0000 mg | ORAL_TABLET | Freq: Every evening | ORAL | Status: DC | PRN
  Administered 2015-09-05: 5 mg via ORAL
  Filled 2015-09-04: qty 1

## 2015-09-04 MED ORDER — DEXAMETHASONE SODIUM PHOSPHATE 10 MG/ML IJ SOLN
INTRAMUSCULAR | Status: AC
  Filled 2015-09-04: qty 1

## 2015-09-04 MED ORDER — PROPOFOL 10 MG/ML IV BOLUS
INTRAVENOUS | Status: AC
  Filled 2015-09-04: qty 40

## 2015-09-04 MED ORDER — DIPHENHYDRAMINE HCL 50 MG/ML IJ SOLN: 12.5000 mg | Freq: Four times a day (QID) | INTRAMUSCULAR | Status: DC | PRN

## 2015-09-04 SURGICAL SUPPLY — 14 items
BAG URINE DRAINAGE (UROLOGICAL SUPPLIES) ×1 IMPLANT
BAG URO CATCHER STRL LF (MISCELLANEOUS) ×2 IMPLANT
CATH HEMA 3WAY 30CC 22FR COUDE (CATHETERS) ×2 IMPLANT
CLOTH BEACON ORANGE TIMEOUT ST (SAFETY) ×2 IMPLANT
GLOVE BIOGEL M STRL SZ7.5 (GLOVE) ×2 IMPLANT
GOWN STRL REUS W/TWL LRG LVL3 (GOWN DISPOSABLE) ×2 IMPLANT
GOWN STRL REUS W/TWL XL LVL3 (GOWN DISPOSABLE) ×2 IMPLANT
HOLDER FOLEY CATH W/STRAP (MISCELLANEOUS) ×1 IMPLANT
LOOP CUT BIPOLAR 24F LRG (ELECTROSURGICAL) ×1 IMPLANT
MANIFOLD NEPTUNE II (INSTRUMENTS) ×2 IMPLANT
PACK CYSTO (CUSTOM PROCEDURE TRAY) ×2 IMPLANT
SYR 30ML LL (SYRINGE) ×1 IMPLANT
SYRINGE IRR TOOMEY STRL 70CC (SYRINGE) ×2 IMPLANT
TUBING CONNECTING 10 (TUBING) ×3 IMPLANT

## 2015-09-04 NOTE — Anesthesia Preprocedure Evaluation (Signed)
Anesthesia Evaluation  Patient identified by MRN, date of birth, ID band Patient awake    Reviewed: Allergy & Precautions, NPO status , Patient's Chart, lab work & pertinent test results  History of Anesthesia Complications (+) PONV  Airway Mallampati: I  TM Distance: >3 FB Neck ROM: Full    Dental  (+) Teeth Intact, Dental Advisory Given   Pulmonary former smoker,    breath sounds clear to auscultation       Cardiovascular hypertension, Pt. on medications  Rhythm:Regular Rate:Normal     Neuro/Psych    GI/Hepatic GERD  Medicated and Controlled,  Endo/Other    Renal/GU      Musculoskeletal   Abdominal   Peds  Hematology   Anesthesia Other Findings   Reproductive/Obstetrics                             Anesthesia Physical Anesthesia Plan  ASA: II  Anesthesia Plan: General   Post-op Pain Management:    Induction: Intravenous  Airway Management Planned: LMA  Additional Equipment:   Intra-op Plan:   Post-operative Plan: Extubation in OR  Informed Consent: I have reviewed the patients History and Physical, chart, labs and discussed the procedure including the risks, benefits and alternatives for the proposed anesthesia with the patient or authorized representative who has indicated his/her understanding and acceptance.   Dental advisory given  Plan Discussed with: CRNA, Anesthesiologist and Surgeon  Anesthesia Plan Comments:         Anesthesia Quick Evaluation

## 2015-09-04 NOTE — Interval H&P Note (Signed)
History and Physical Interval Note:  09/04/2015 7:25 AM  [name]  has presented today for surgery, with the diagnosis of BENIGN LOCALIZED PROSTATE HYPERPLASIA  The various methods of treatment have been discussed with the patient and family. After consideration of risks, benefits and other options for treatment, the patient has consented to  Procedure(s): TRANSURETHRAL RESECTION OF THE PROSTATE (TURP) (N/A) as a surgical intervention .  The patient's history has been reviewed, patient examined, no change in status, stable for surgery.  I have reviewed the patient's chart and labs.  Questions were answered to the patient's satisfaction.     Sheppard Luckenbach I Koden Hunzeker

## 2015-09-04 NOTE — Transfer of Care (Signed)
Immediate Anesthesia Transfer of Care Note  Patient: [name]  Procedure(s) Performed: Procedure(s): TRANSURETHRAL RESECTION OF THE PROSTATE (TURP) (N/A)  Patient Location: PACU  Anesthesia Type:General  Level of Consciousness: awake, alert  and oriented  Airway & Oxygen Therapy: Patient Spontanous Breathing and Patient connected to face mask oxygen  Post-op Assessment: Report given to RN  Post vital signs: Reviewed and stable  Last Vitals: 132/78, 60, 12, 100%, 97.4 Filed Vitals:   09/04/15 0519  BP: 137/72  Pulse: 60  Temp: 36.4 C  Resp: 18    Last Pain: There were no vitals filed for this visit.       Complications: No apparent anesthesia complications

## 2015-09-04 NOTE — Op Note (Signed)
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 Tissue sent to pathology, per second Timeout protocol  Surgeon: S. Gaynelle Arabian, MD  First assistant: None  Anesthesia: Gen. LMA  Preparation: After appropriate preanesthesia, the patient was brought the operating, placed on the operating table in dorsal supine position where general LMA anesthesia was introduced. He was then replaced in the dorsal lithotomy position with the pubis was prepped with Betadine solution, and draped in usual fashion. The history was reviewed. The armband was double checked.  Review history:  Active Problems Problems  1. Acute urinary retention (R33.8) 2. Bladder calculus (N21.0) 3. Gross hematuria (R31.0) 4. Phimosis (N47.1)  History of Present Illness    70 yo diabetic male returns today for a cystoscopy, flowrate, PVR & prostate u/s. Originally referred by Dr. Alfonzo Beers (ER physician) for f/u after being seen in the ER on 07/04/15 for urinary retention, of 1500cc at [facility]. He was noted to have episode of urinary dribbling 4 months ago, followed by episode of recent gross painless hematuria, thought 2ndary to taking Eliquis for Atrial Fibrillation, and subsequent urinary retention with 1500 cc residual.. C/s negative. 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 Seurm Cr. 1.5. He was begun on flomax, 0.4mg . in [place], and referred for f/u.     07/04/15 C&S - negative   Past Medical  History Problems  1. History of DVT of leg (deep venous thrombosis) (I82.409) 2. History of arthritis (Z87.39) 3. History of asthma (Z87.09) 4. History of atrial fibrillation (Z86.79) 5. History of diabetes mellitus (Z86.39) 6. History of heartburn (Z87.898) 7. History of hypercholesterolemia (Z86.39)   Statement of Likelihood of Success: Excellent. TIME-OUT observed.:  Procedure: Following urethral meatal dilation to a size 28 [place] with the [employer] sounds for urethral meatal stricture, the continuous flow resectoscope was placed within the bladder. Bilobar BPH was noted with elevated bladder neck. Cystoscopic findings revealed 2 bladder diverticula, at the anterior right ladder base, and the posterior bladder dome. Trabeculation and cellules were also identified. There was no evidence of bladder stone or tumor noted, however.  Resection was accomplished from the 7:00 to the 5:00 position, from the 11:00 to the 7:00 position, and from the 2:00 to the 5:00 positions. Chips were evacuated and sent to the laboratory for evaluation.  Hemostasis was achieved with the electrosurgical unit.  A size 22 French 30 mL hematuria catheter was passed without difficulty, and inflated with sterile water. The urine was clear. Mild traction was placed. Continuous irrigation was placed and the patient was awakened and taken to recovery room in good condition. He received a BX and O suppository.  Estimated blood loss was 300 mL.

## 2015-09-04 NOTE — Anesthesia Procedure Notes (Signed)
Procedure Name: LMA Insertion Date/Time: 09/04/2015 7:33 AM Performed by: Bethena Roys T Pre-anesthesia Checklist: Patient identified, Emergency Drugs available, Suction available and Patient being monitored Patient Re-evaluated:Patient Re-evaluated prior to inductionOxygen Delivery Method: Circle system utilized Preoxygenation: Pre-oxygenation with 100% oxygen Intubation Type: IV induction Ventilation: Mask ventilation without difficulty LMA: LMA inserted LMA Size: 5.0 Number of attempts: 1 Airway Equipment and Method: Bite block Placement Confirmation: positive ETCO2 Dental Injury: Teeth and Oropharynx as per pre-operative assessment

## 2015-09-04 NOTE — Anesthesia Postprocedure Evaluation (Signed)
Anesthesia Post Note  Patient: [name]  Procedure(s) Performed: Procedure(s) (LRB): TRANSURETHRAL RESECTION OF THE PROSTATE (TURP) (N/A)  Patient location during evaluation: PACU Anesthesia Type: General Level of consciousness: awake and alert Pain management: pain level controlled Vital Signs Assessment: post-procedure vital signs reviewed and stable Respiratory status: spontaneous breathing, nonlabored ventilation, respiratory function stable and patient connected to face mask oxygen Cardiovascular status: blood pressure returned to baseline and stable Postop Assessment: no signs of nausea or vomiting Anesthetic complications: no    Last Vitals:  Filed Vitals:   09/04/15 0900 09/04/15 0915  BP: 122/79 115/71  Pulse: 56 48  Temp:    Resp: 11 11    Last Pain:  Filed Vitals:   09/04/15 0917  PainSc: 0-No pain                 Nayzeth Altman A

## 2015-09-05 DIAGNOSIS — N401 Enlarged prostate with lower urinary tract symptoms: Secondary | ICD-10-CM | POA: Diagnosis not present

## 2015-09-05 MED ORDER — OXYCODONE-ACETAMINOPHEN 5-325 MG PO TABS
1.0000 | ORAL_TABLET | ORAL | Status: DC | PRN
  Administered 2015-09-05: 1 via ORAL
  Filled 2015-09-05: qty 1

## 2015-09-05 MED ORDER — BISACODYL 10 MG RE SUPP
10.0000 mg | Freq: Every day | RECTAL | Status: DC | PRN
Start: 2015-09-05 — End: 2015-09-06
  Administered 2015-09-05: 10 mg via RECTAL
  Filled 2015-09-05: qty 1

## 2015-09-05 MED ORDER — PHENAZOPYRIDINE HCL 200 MG PO TABS
200.0000 mg | ORAL_TABLET | Freq: Three times a day (TID) | ORAL | Status: DC | PRN
Start: 2015-09-05 — End: 2015-09-11

## 2015-09-05 MED ORDER — BACITRACIN-NEOMYCIN-POLYMYXIN 400-5-5000 EX OINT: 1.0000 "application " | TOPICAL_OINTMENT | Freq: Three times a day (TID) | CUTANEOUS | Status: DC | PRN

## 2015-09-05 MED ORDER — TRAMADOL-ACETAMINOPHEN 37.5-325 MG PO TABS: 1.0000 | ORAL_TABLET | Freq: Four times a day (QID) | ORAL | Status: DC | PRN

## 2015-09-05 MED ORDER — OXYBUTYNIN CHLORIDE 5 MG PO TABS: 5.0000 mg | ORAL_TABLET | Freq: Three times a day (TID) | ORAL | Status: DC | PRN

## 2015-09-05 MED ORDER — FLEET ENEMA 7-19 GM/118ML RE ENEM: 1.0000 | ENEMA | Freq: Once | RECTAL | Status: DC

## 2015-09-05 NOTE — Discharge Summary (Signed)
Physician Discharge Summary  Patient ID: [name] MRN: [id number] DOB/AGE: 68-Jul-1947 68 y.o.  Admit date: 09/04/2015 Discharge date: 09/05/2015  Admission Diagnoses: [place] HYPERPLASIA  Discharge Diagnoses:  Active Problems:   Benign hypertrophy of prostate   Discharged Condition: stable  Hospital Course:  stable  Significant Diagnostic Studies: No results found.  Discharge Exam: Blood pressure 110/60, pulse 64, temperature 98.1 F (36.7 C), temperature source Oral, resp. rate 18, height 6' (1.829 m), weight 83.915 kg (185 lb), SpO2 100 %.   Disposition: 01-Home or Self Care  Discharge Instructions    Continue foley catheter    Complete by:  As directed      Discharge patient    Complete by:  As directed             Medication List    STOP taking these medications        aspirin EC 81 MG tablet     ciprofloxacin 500 MG tablet  Commonly known as:  CIPRO     enoxaparin 80 MG/0.8ML injection  Commonly known as:  LOVENOX     finasteride 5 MG tablet  Commonly known as:  PROSCAR     Fish Oil 1000 MG Caps     METAMUCIL PO     nitrofurantoin 50 MG capsule  Commonly known as:  MACRODANTIN     tamsulosin 0.4 MG Caps capsule  Commonly known as:  FLOMAX     timolol 0.5 % ophthalmic solution  Commonly known as:  TIMOPTIC     XARELTO 20 MG Tabs tablet  Generic drug:  rivaroxaban      TAKE these medications        gabapentin 300 MG capsule  Commonly known as:  NEURONTIN  Take 1 capsule at bedtime for 7 days, then 1 capsule twice daily for 7 days, then 1 capsule three times daily     lisinopril 40 MG tablet  Commonly known as:  PRINIVIL,ZESTRIL  TAKE 1 TABLET (40 MG TOTAL) BY MOUTH DAILY.     neomycin-bacitracin-polymyxin ointment  Commonly known as:  NEOSPORIN  Apply 1 application topically 3 (three) times daily as needed for wound care (catheter irritation). apply to eye     oxybutynin 5 MG tablet  Commonly known as:  DITROPAN   Take 1 tablet (5 mg total) by mouth every 8 (eight) hours as needed for bladder spasms.     phenazopyridine 200 MG tablet  Commonly known as:  PYRIDIUM  Take 1 tablet (200 mg total) by mouth 3 (three) times daily as needed for pain.     traMADol-acetaminophen 37.5-325 MG tablet  Commonly known as:  ULTRACET  Take 1 tablet by mouth every 6 (six) hours as needed.           Follow-up Information    Follow up with Ailene Rud, MD.   Specialty:  Urology   Contact information:   [place] [address] [phone]      Leave foley in place and RTC per appointment for foley catheter removal.  Signed: Benjimen Kelley I Roderica Cathell 09/05/2015, 11:10 AM

## 2015-09-05 NOTE — Progress Notes (Signed)
Urology Progress Note  1 Day Post-Op   Subjective: Post TURP. Urine still bloody, but no clots. C/o meatal discomfort-probably 2ndary dilation.     No acute urologic events overnight. Ambulation:   positive Flatus:    positive Bowel movement  negative  Pain: some relief  Objective:  Blood pressure 110/60, pulse 64, temperature 98.1 F (36.7 C), temperature source Oral, resp. rate 18, height 6' (1.829 m), weight 83.915 kg (185 lb), SpO2 100 %.  Physical Exam:  General:  No acute distress, awake Extremities: extremities normal, atraumatic, no cyanosis or edema Genitourinary:  Net s-p area.  Foley: urine still bloody.     I/O last 3 completed shifts: In: [id number] [P.O.:960; I.V.:2230; [id number] Out: [id number] [Urine:33775; Blood:300]  No results for input(s): HGB, WBC, PLT in the last 72 hours.  No results for input(s): NA, K, CL, CO2, BUN, CREATININE, CALCIUM, GFRNONAA, GFRAA in the last 72 hours.  Invalid input(s): MAGNESIUM   Recent Labs     09/04/15  0600  INR  1.13     Invalid input(s): ABG  Assessment/Plan:  Catheter not removed. Pt is to increase activities as tolerated.   RTC next week for catheter removal. ( has appointment already).

## 2015-09-05 NOTE — Plan of Care (Signed)
Problem: Urinary Elimination: Goal: Ability to avoid or minimize complications of infection will improve Outcome: Completed/Met Date Met:  09/05/15 Pt will go home with foley catheter. Currently cbi is finishing up, urine is red in color. Will irrigate prior to DC per m.d recommendation

## 2015-09-05 NOTE — Progress Notes (Signed)
Patient has been discharged, attempt made to discontinue cbi. Bladder has now been irrigated x3. Pt continues to experience clots with dark red colored urine without cbi. Provider has been paged for update

## 2015-09-05 NOTE — Plan of Care (Signed)
Problem: Fluid Volume: Goal: Ability to maintain a balanced intake and output will improve Outcome: Completed/Met Date Met:  09/05/15 Pt has tolerated po's very well. N/v denied

## 2015-09-05 NOTE — Plan of Care (Signed)
Problem: Urinary Elimination: Goal: Ability to achieve and maintain urine output will improve Outcome: Completed/Met Date Met:  09/05/15 Pt is to be discharged with foley catheter. Output is sufficient, red in color. No clots present at this time. will continue to monitor until tine of discharge. Instruction provided regarding s&s of complications r/t foley

## 2015-09-05 NOTE — Care Management Obs Status (Signed)
[facility] NOTIFICATION   Patient Details  Name: [name] MRN: [id number] Date of Birth: January 10, 1946   Medicare Observation Status Notification Given:  Yes    Guadalupe Maple, RN 09/05/2015, 3:58 PM

## 2015-09-05 NOTE — Discharge Instructions (Addendum)
Benign Prostatic Hyperplasia An enlarged prostate (benign prostatic hyperplasia) is common in older men. You may experience the following:  Weak urine stream.  Dribbling.  Feeling like the bladder has not emptied completely.  Difficulty starting urination.  Getting up frequently at night to urinate.  Urinating more frequently during the day. HOME CARE INSTRUCTIONS  Monitor your prostatic hyperplasia for any changes. The following actions may help to alleviate any discomfort you are experiencing:  Give yourself time when you urinate.  Stay away from alcohol.  Avoid beverages containing caffeine, such as coffee, tea, and colas, because they can make the problem worse.  Avoid decongestants, antihistamines, and some prescription medicines that can make the problem worse.  Follow up with your health care provider for further treatment as recommended. SEEK MEDICAL CARE IF:  You are experiencing progressive difficulty voiding.  Your urine stream is progressively getting narrower.  You are awaking from sleep with the urge to void more frequently.  You are constantly feeling the need to void.  You experience loss of urine, especially in small amounts. SEEK IMMEDIATE MEDICAL CARE IF:   You develop increased pain with urination or are unable to urinate.  You develop severe abdominal pain, vomiting, a high fever, or fainting.  You develop back pain or blood in your urine. MAKE SURE YOU:   Understand these instructions.  Will watch your condition.  Will get help right away if you are not doing well or get worse.   This information is not intended to replace advice given to you by your health care provider. Make sure you discuss any questions you have with your health care provider.   Document Released: 03/22/2005 Document Revised: 04/12/2014 Document Reviewed: 08/22/2012 Elsevier Interactive Patient Education 2016 [place] transurethral resection of the prostate  (TURP) instructions  Your recent prostate surgery requires very special post hospital care. Despite the fact that no skin incisions were used the area around the prostate incision is quite raw and is covered with a scab to promote healing and prevent bleeding. Certain cautions are needed to assure that the scab is not disturbed of the next 2-3 weeks while the healing proceeds.  Because the raw surface in your prostate and the irritating effects of urine you may expect frequency of urination and/or urgency (a stronger desire to urinate) and perhaps even getting up at night more often. This will usually resolve or improve slowly over the healing period. You may see some blood in your urine over the first 6 weeks. Do not be alarmed, even if the urine was clear for a while. Get off your feet and drink lots of fluids until clearing occurs. If you start to pass clots or don't improve call us.  Diet:  You may return to your normal diet immediately. Because of the raw surface of your bladder, alcohol, spicy foods, foods high in acid and drinks with caffeine may cause irritation or frequency and should be used in moderation. To keep your urine flowing freely and avoid constipation, drink plenty of fluids during the day (8-10 glasses). Tip: Avoid cranberry juice because it is very acidic.  Activity:  Your physical activity doesn't need to be restricted. However, if you are very active, you may see some blood in the urine. We suggest that you reduce your activity under the circumstances until the bleeding has stopped.  Bowels:  It is important to keep your bowels regular during the postoperative period. Straining with bowel movements can cause bleeding. A bowel  movement every other day is reasonable. Use a mild laxative if needed, such as milk of magnesia 2-3 tablespoons, or 2 Dulcolax tablets. Call if you continue to have problems. If you had been taking narcotics for pain, before, during or after your  surgery, you may be constipated. Take a laxative if necessary.  Medication:  You should resume your pre-surgery medications unless told not to. In addition you may be given an antibiotic to prevent or treat infection. Antibiotics are not always necessary. All medication should be taken as prescribed until the bottles are finished unless you are having an unusual reaction to one of the drugs.     Problems you should report to [place]:  a. Fever greater than 101F. b. Heavy bleeding, or clots (see notes above about blood in urine). c. Inability to urinate. d. Drug reactions (hives, rash, nausea, vomiting, diarrhea). e. Severe burning or pain with urination that is not improving.

## 2015-09-06 ENCOUNTER — Other Ambulatory Visit: Payer: Self-pay | Admitting: Family Medicine

## 2015-09-06 MED ORDER — TRIMETHOPRIM 100 MG PO TABS
100.0000 mg | ORAL_TABLET | ORAL | Status: DC
Start: 2015-09-06 — End: 2015-09-23

## 2015-09-06 MED ORDER — PHENAZOPYRIDINE HCL 200 MG PO TABS: 200.0000 mg | ORAL_TABLET | Freq: Three times a day (TID) | ORAL | Status: DC | PRN

## 2015-09-06 MED ORDER — TRAMADOL-ACETAMINOPHEN 37.5-325 MG PO TABS: 1.0000 | ORAL_TABLET | Freq: Four times a day (QID) | ORAL | Status: DC | PRN

## 2015-09-06 NOTE — Discharge Summary (Signed)
Physician Discharge Summary  Patient ID: [name] MRN: [id number] DOB/AGE: 1946/02/07 70 y.o.  Admit date: 09/04/2015 Discharge date: 09/06/2015  Admission Diagnoses: [place] HYPERPLASIA  Discharge Diagnoses:  Active Problems:   Benign hypertrophy of prostate   Discharged Condition: improved  Hospital Course: TURP Thursday, Bleeding with clot evacuation Friday. For discharge Saturday AM.   Significant Diagnostic Studies: No results found.  Discharge Exam: Blood pressure 103/54, pulse 62, temperature 98 F (36.7 C), temperature source Oral, resp. rate 19, height 6' (1.829 m), weight 83.915 kg (185 lb), SpO2 100 %.   Disposition: 01-Home or Self Care  Discharge Instructions    Continue foley catheter    Complete by:  As directed      Continue foley catheter    Complete by:  As directed      Discharge patient    Complete by:  As directed      Discharge patient    Complete by:  As directed      Discontinue IV    Complete by:  As directed             Medication List    STOP taking these medications        aspirin EC 81 MG tablet     ciprofloxacin 500 MG tablet  Commonly known as:  CIPRO     enoxaparin 80 MG/0.8ML injection  Commonly known as:  LOVENOX     finasteride 5 MG tablet  Commonly known as:  PROSCAR     Fish Oil 1000 MG Caps     METAMUCIL PO     nitrofurantoin 50 MG capsule  Commonly known as:  MACRODANTIN     tamsulosin 0.4 MG Caps capsule  Commonly known as:  FLOMAX     timolol 0.5 % ophthalmic solution  Commonly known as:  TIMOPTIC     XARELTO 20 MG Tabs tablet  Generic drug:  rivaroxaban      TAKE these medications        gabapentin 300 MG capsule  Commonly known as:  NEURONTIN  Take 1 capsule at bedtime for 7 days, then 1 capsule twice daily for 7 days, then 1 capsule three times daily     lisinopril 40 MG tablet  Commonly known as:  PRINIVIL,ZESTRIL  TAKE 1 TABLET (40 MG TOTAL) BY MOUTH DAILY.     neomycin-bacitracin-polymyxin ointment  Commonly known as:  NEOSPORIN  Apply 1 application topically 3 (three) times daily as needed for wound care (catheter irritation). apply to eye     oxybutynin 5 MG tablet  Commonly known as:  DITROPAN  Take 1 tablet (5 mg total) by mouth every 8 (eight) hours as needed for bladder spasms.     phenazopyridine 200 MG tablet  Commonly known as:  PYRIDIUM  Take 1 tablet (200 mg total) by mouth 3 (three) times daily as needed for pain.     phenazopyridine 200 MG tablet  Commonly known as:  PYRIDIUM  Take 1 tablet (200 mg total) by mouth 3 (three) times daily as needed for pain.     traMADol-acetaminophen 37.5-325 MG tablet  Commonly known as:  ULTRACET  Take 1 tablet by mouth every 6 (six) hours as needed.     traMADol-acetaminophen 37.5-325 MG tablet  Commonly known as:  ULTRACET  Take 1 tablet by mouth every 6 (six) hours as needed.     trimethoprim 100 MG tablet  Commonly known as:  TRIMPEX  Take 1 tablet (100  mg total) by mouth 1 day or 1 dose.           Follow-up Information    Follow up with Ailene Rud, MD.   Specialty:  Urology   Contact information:   [place] [address] [phone]       Follow up with Ailene Rud, MD.   Specialty:  Urology   Contact information:   [place] [address] [phone]       Signed: Ailene Rud 09/06/2015, 12:29 PM

## 2015-09-06 NOTE — Progress Notes (Signed)
Urology Progress Note  2 Days Post-Op   Subjective: Discharge cancelled yesterday b/c of clots, in need of irrigation. Pt lives by himself. He is taught how to irrigate his own catheter.     No acute urologic events overnight. Ambulation:   positive Flatus:    positive Bowel movement  positive  Pain: complete resolution  Objective:  Blood pressure 103/54, pulse 62, temperature 98 F (36.7 C), temperature source Oral, resp. rate 19, height 6' (1.829 m), weight 83.915 kg (185 lb), SpO2 100 %.  Physical Exam:  General:  No acute distress, awake Extremities: extremities normal, atraumatic, no cyanosis or edema Genitourinary:  Normal penis Foley: urine pein this AM . Few clots irrigated this AM.     I/O last 3 completed shifts: In: 37932.5 [P.O.:240; I.V.:1692.5; [id number] Out: [id number] [Urine:43375]  No results for input(s): HGB, WBC, PLT in the last 72 hours.  No results for input(s): NA, K, CL, CO2, BUN, CREATININE, CALCIUM, GFRNONAA, GFRAA in the last 72 hours.  Invalid input(s): MAGNESIUM   Recent Labs     09/04/15  0600  INR  1.13     Invalid input(s): ABG  Assessment/Plan:  Catheter not removed. Will remove next week in office. D/c today.

## 2015-09-11 ENCOUNTER — Ambulatory Visit (INDEPENDENT_AMBULATORY_CARE_PROVIDER_SITE_OTHER): Payer: Medicare Other | Admitting: Internal Medicine

## 2015-09-11 ENCOUNTER — Encounter: Payer: Self-pay | Admitting: Internal Medicine

## 2015-09-11 VITALS — BP 100/70 | HR 88 | Temp 97.8°F | Wt 190.0 lb

## 2015-09-11 DIAGNOSIS — I82402 Acute embolism and thrombosis of unspecified deep veins of left lower extremity: Secondary | ICD-10-CM

## 2015-09-11 DIAGNOSIS — N401 Enlarged prostate with lower urinary tract symptoms: Secondary | ICD-10-CM | POA: Diagnosis not present

## 2015-09-11 DIAGNOSIS — R3912 Poor urinary stream: Secondary | ICD-10-CM | POA: Diagnosis not present

## 2015-09-11 NOTE — Progress Notes (Signed)
Pre visit review using our clinic review tool, if applicable. No additional management support is needed unless otherwise documented below in the visit note. 
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
## 2015-09-11 NOTE — Assessment & Plan Note (Addendum)
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 Hopefully if more than xarelto needed--they can intervene and make recommendations

## 2015-09-11 NOTE — Patient Instructions (Signed)
Please call Dr Mike Gip tomorrow to review what is going on.

## 2015-09-12 ENCOUNTER — Other Ambulatory Visit: Payer: Self-pay | Admitting: Hematology and Oncology

## 2015-09-12 ENCOUNTER — Other Ambulatory Visit: Payer: Self-pay | Admitting: *Deleted

## 2015-09-12 ENCOUNTER — Ambulatory Visit
Admission: RE | Admit: 2015-09-12 | Discharge: 2015-09-12 | Disposition: A | Discharge status: Home / Self Care | Payer: Medicare Other | Source: Ambulatory Visit | Attending: Hematology and Oncology | Admitting: Hematology and Oncology

## 2015-09-12 ENCOUNTER — Telehealth: Payer: Self-pay | Admitting: *Deleted

## 2015-09-12 ENCOUNTER — Other Ambulatory Visit: Payer: Self-pay | Admitting: Interventional Radiology

## 2015-09-12 DIAGNOSIS — I82402 Acute embolism and thrombosis of unspecified deep veins of left lower extremity: Secondary | ICD-10-CM

## 2015-09-12 DIAGNOSIS — I82412 Acute embolism and thrombosis of left femoral vein: Secondary | ICD-10-CM | POA: Diagnosis not present

## 2015-09-12 DIAGNOSIS — I82422 Acute embolism and thrombosis of left iliac vein: Secondary | ICD-10-CM

## 2015-09-12 MED ORDER — RIVAROXABAN (XARELTO) VTE STARTER PACK (15 & 20 MG): ORAL_TABLET | ORAL | Status: DC

## 2015-09-12 NOTE — Telephone Encounter (Signed)
Per Dr Mike Gip, needs [place] LLE, if positive for clots will need to change Xarelto to 15 mg bid times 21 days. Order entered for doppler and patient agrees to come for appt at [facility] and be here at 1230 for 1oo appt

## 2015-09-12 NOTE — Telephone Encounter (Signed)
while pt was at pharmacy he was told to start 15 mg bid and he has not taken any of the blood thinner today and he has 20 mg at home in which he will start after he has take then 15 mg bid x 21 days. Pharmacist told him that and I can hear pt agreeing over the phone.   Then I called and got his voicemail and told him to just take 1 15 mg tablet today and start on it twice a day tom. Because it will be too late in the day to get both doses in. The doctor wants 10 hours in between and there is not enough time in the day today to work in 2 doses.

## 2015-09-12 NOTE — Telephone Encounter (Signed)
Patient called to report that his left leg is swollen and hurts, he went to see his PCP yesterday who instructed him to contact us. Per Dr Alla German note, he was off Xarelto for surgical procedure for a week and just restarted his Xarelto 6/7. Please advise

## 2015-09-16 ENCOUNTER — Other Ambulatory Visit: Payer: Self-pay | Admitting: Hematology and Oncology

## 2015-09-16 ENCOUNTER — Telehealth: Payer: Self-pay | Admitting: Hematology and Oncology

## 2015-09-16 NOTE — Telephone Encounter (Signed)
Re:  Left lower extremity DVT  Spoke to patient regarding left lower extremity duplex on Friday, 09/12/2015. There was extensive left lower extremity deep venous thrombosis with complete occlusion of the common femoral vein, saphenous femoral junction, and femoral vein. There was near complete occlusion of the profunda femoral vein.  There was no thrombus seen more distally.  He noted left leg swelling and minimal discomfort.  There was no change in color or temperature in the left leg.  He denies any chest pain, shortness of breath or hemoptysis.  Discussed plan to increase Xarelto to 15 mg BID x 3 weeks then 20 mg a day.  This is a new clot which started after he held his Xarelto for a prostate procedure.  He will need to be bridged with Lovenox for procedures in the future.  I spoke with Dr. Earleen Newport.  He will see him on Tuesday, 09/16/2015, for evaluation and possible thrombolysis.  If he has any problems over the weekend, he would be admitted in [facility] for IV heparin then procedure.   Lequita Asal, MD

## 2015-09-17 DIAGNOSIS — Z85828 Personal history of other malignant neoplasm of skin: Secondary | ICD-10-CM | POA: Diagnosis not present

## 2015-09-17 DIAGNOSIS — B0089 Other herpesviral infection: Secondary | ICD-10-CM | POA: Diagnosis not present

## 2015-09-17 DIAGNOSIS — B009 Herpesviral infection, unspecified: Secondary | ICD-10-CM | POA: Diagnosis not present

## 2015-09-17 DIAGNOSIS — L218 Other seborrheic dermatitis: Secondary | ICD-10-CM | POA: Diagnosis not present

## 2015-09-18 ENCOUNTER — Ambulatory Visit
Admission: RE | Admit: 2015-09-18 | Discharge: 2015-09-18 | Disposition: A | Discharge status: Home / Self Care | Payer: Medicare Other | Source: Ambulatory Visit | Attending: Interventional Radiology | Admitting: Interventional Radiology

## 2015-09-18 DIAGNOSIS — I82419 Acute embolism and thrombosis of unspecified femoral vein: Secondary | ICD-10-CM | POA: Diagnosis not present

## 2015-09-18 DIAGNOSIS — I82422 Acute embolism and thrombosis of left iliac vein: Secondary | ICD-10-CM
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
## 2015-09-19 ENCOUNTER — Other Ambulatory Visit (HOSPITAL_COMMUNITY): Payer: Self-pay | Admitting: Interventional Radiology

## 2015-09-19 DIAGNOSIS — I82402 Acute embolism and thrombosis of unspecified deep veins of left lower extremity: Secondary | ICD-10-CM

## 2015-09-23 ENCOUNTER — Other Ambulatory Visit: Payer: Self-pay | Admitting: Radiology

## 2015-09-23 DIAGNOSIS — N401 Enlarged prostate with lower urinary tract symptoms: Secondary | ICD-10-CM | POA: Diagnosis not present

## 2015-09-23 DIAGNOSIS — N138 Other obstructive and reflux uropathy: Secondary | ICD-10-CM | POA: Diagnosis not present

## 2015-09-24 ENCOUNTER — Ambulatory Visit (HOSPITAL_COMMUNITY)
Admission: RE | Admit: 2015-09-24 | Discharge: 2015-09-24 | Disposition: A | Discharge status: Home / Self Care | Payer: Medicare Other | Source: Ambulatory Visit | Attending: Interventional Radiology | Admitting: Interventional Radiology

## 2015-09-24 ENCOUNTER — Other Ambulatory Visit (HOSPITAL_COMMUNITY): Payer: Self-pay | Admitting: Interventional Radiology

## 2015-09-24 ENCOUNTER — Encounter (HOSPITAL_COMMUNITY): Payer: Self-pay

## 2015-09-24 ENCOUNTER — Telehealth: Payer: Self-pay | Admitting: Hematology and Oncology

## 2015-09-24 DIAGNOSIS — R339 Retention of urine, unspecified: Secondary | ICD-10-CM | POA: Diagnosis not present

## 2015-09-24 DIAGNOSIS — G500 Trigeminal neuralgia: Secondary | ICD-10-CM | POA: Insufficient documentation

## 2015-09-24 DIAGNOSIS — Z8249 Family history of ischemic heart disease and other diseases of the circulatory system: Secondary | ICD-10-CM | POA: Insufficient documentation

## 2015-09-24 DIAGNOSIS — I10 Essential (primary) hypertension: Secondary | ICD-10-CM | POA: Insufficient documentation

## 2015-09-24 DIAGNOSIS — K219 Gastro-esophageal reflux disease without esophagitis: Secondary | ICD-10-CM | POA: Diagnosis not present

## 2015-09-24 DIAGNOSIS — I82402 Acute embolism and thrombosis of unspecified deep veins of left lower extremity: Secondary | ICD-10-CM

## 2015-09-24 DIAGNOSIS — Z87891 Personal history of nicotine dependence: Secondary | ICD-10-CM | POA: Diagnosis not present

## 2015-09-24 DIAGNOSIS — I82522 Chronic embolism and thrombosis of left iliac vein: Secondary | ICD-10-CM | POA: Insufficient documentation

## 2015-09-24 DIAGNOSIS — R319 Hematuria, unspecified: Secondary | ICD-10-CM | POA: Diagnosis not present

## 2015-09-24 DIAGNOSIS — I871 Compression of vein: Secondary | ICD-10-CM | POA: Insufficient documentation

## 2015-09-24 DIAGNOSIS — T85868A Thrombosis due to other internal prosthetic devices, implants and grafts, initial encounter: Secondary | ICD-10-CM | POA: Diagnosis not present

## 2015-09-24 LAB — CBC WITH DIFFERENTIAL/PLATELET
BASOS ABS: 0 10*3/uL (ref 0.0–0.1)
BASOS PCT: 1 %
EOS ABS: 0.5 10*3/uL (ref 0.0–0.7)
Eosinophils Relative: 6 %
HEMATOCRIT: 29.2 % — AB (ref 39.0–52.0)
Hemoglobin: 9.2 g/dL — ABNORMAL LOW (ref 13.0–17.0)
Lymphocytes Relative: 8 %
Lymphs Abs: 0.7 10*3/uL (ref 0.7–4.0)
MCH: 26.7 pg (ref 26.0–34.0)
MCHC: 31.5 g/dL (ref 30.0–36.0)
MCV: 84.9 fL (ref 78.0–100.0)
MONO ABS: 0.6 10*3/uL (ref 0.1–1.0)
MONOS PCT: 7 %
NEUTROS ABS: 6.6 10*3/uL (ref 1.7–7.7)
NEUTROS PCT: 78 %
Platelets: 310 10*3/uL (ref 150–400)
RBC: 3.44 MIL/uL — ABNORMAL LOW (ref 4.22–5.81)
RDW: 13 % (ref 11.5–15.5)
WBC: 8.3 10*3/uL (ref 4.0–10.5)

## 2015-09-24 LAB — BASIC METABOLIC PANEL
ANION GAP: 8 (ref 5–15)
BUN: 10 mg/dL (ref 6–20)
CALCIUM: 9.4 mg/dL (ref 8.9–10.3)
CO2: 26 mmol/L (ref 22–32)
CREATININE: 1.07 mg/dL (ref 0.61–1.24)
Chloride: 104 mmol/L (ref 101–111)
GLUCOSE: 111 mg/dL — AB (ref 65–99)
Potassium: 4.1 mmol/L (ref 3.5–5.1)
Sodium: 138 mmol/L (ref 135–145)

## 2015-09-24 LAB — PROTIME-INR
INR: 1.1 (ref 0.00–1.49)
PROTHROMBIN TIME: 14.4 s (ref 11.6–15.2)

## 2015-09-24 LAB — APTT: APTT: 32 s (ref 24–37)

## 2015-09-24 MED ORDER — FENTANYL CITRATE (PF) 100 MCG/2ML IJ SOLN
INTRAMUSCULAR | Status: AC | PRN
  Administered 2015-09-24 (×2): 50 ug via INTRAVENOUS
  Administered 2015-09-24 (×2): 25 ug via INTRAVENOUS

## 2015-09-24 MED ORDER — LIDOCAINE HCL 1 % IJ SOLN
INTRAMUSCULAR | Status: AC
  Administered 2015-09-24: 10 mL
  Filled 2015-09-24: qty 20

## 2015-09-24 MED ORDER — SODIUM CHLORIDE 0.9 % IV SOLN: INTRAVENOUS | Status: DC

## 2015-09-24 MED ORDER — SODIUM CHLORIDE 0.9 % IV SOLN
INTRAVENOUS | Status: AC | PRN
  Administered 2015-09-24: 150 mL/h via INTRAVENOUS

## 2015-09-24 MED ORDER — FENTANYL CITRATE (PF) 100 MCG/2ML IJ SOLN
INTRAMUSCULAR | Status: AC
  Filled 2015-09-24: qty 4

## 2015-09-24 MED ORDER — IOHEXOL 300 MG/ML  SOLN
150.0000 mL | Freq: Once | INTRAMUSCULAR | Status: AC | PRN
  Administered 2015-09-24: 75 mL via INTRAVENOUS

## 2015-09-24 MED ORDER — SODIUM CHLORIDE 0.9 % IV SOLN: INTRAVENOUS | Status: AC

## 2015-09-24 MED ORDER — MIDAZOLAM HCL 2 MG/2ML IJ SOLN
INTRAMUSCULAR | Status: AC
  Filled 2015-09-24: qty 4

## 2015-09-24 MED ORDER — MIDAZOLAM HCL 2 MG/2ML IJ SOLN
INTRAMUSCULAR | Status: AC | PRN
  Administered 2015-09-24: 1 mg via INTRAVENOUS
  Administered 2015-09-24: 0.5 mg via INTRAVENOUS
  Administered 2015-09-24: 1 mg via INTRAVENOUS

## 2015-09-24 NOTE — Procedures (Signed)
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  He has had TURP 6/12017 requiring withdrawal of anticoagulation, and his DVT recurred with symptoms 6/7.    Procedure:  Left popliteal vein US guided puncture, with left leg venogram, and balloon angioplasty of chronic venous occlusion of Wall-stent, with restoration of in-line flow through the left iliofemoral system after Zelante/Angiojet and angioplasty.   Findings:  Dense chronic in-stent stenosis of the Wall-Stent with occlusion.  Restoration of flow after prolonged angioplasty of chronic stenosis.    Complications: None Recommendations:  - Observe for 2 hours, with plan to DC today - Would advise return to anticoagulation with oral or lovenox, if can tolerate. Will discuss with Dr. Mike Gip  - Continue hydration for 2 hours - Advise ASA 325 for 30 days, then 81mg  indefinite. - Follow up in 2 weeks with Dr. Earleen Newport at [facility] clinic - Routine care  Signed,  Dulcy Fanny. Earleen Newport, DO

## 2015-09-24 NOTE — Sedation Documentation (Signed)
IV increased to 200cc per hour

## 2015-09-24 NOTE — Sedation Documentation (Signed)
Patient denies pain and is resting comfortably.  

## 2015-09-24 NOTE — Sedation Documentation (Signed)
Patient is resting comfortably. 

## 2015-09-24 NOTE — Progress Notes (Signed)
Pt was to be discharged at 1200 today, Dr Earleen Newport nneded to make contact with 2 other physicians regarding his anticoagulation. PA Claiborne Billings from radiology did call and speak to the pt/ per pt he is to restart his xarelto 15 mg BID and aspirin 81 mg daily. Pt is now waiting for his daughter to pick him up.
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
## 2015-09-24 NOTE — Sedation Documentation (Signed)
Having pain with balloon inflation, otherwise comfortable.

## 2015-09-24 NOTE — Discharge Instructions (Signed)
Venogram, Care After °Refer to this sheet in the next few weeks. These instructions provide you with information on caring for yourself after your procedure. Your health care provider may also give you more specific instructions. Your treatment has been planned according to current medical practices, but problems sometimes occur. Call your health care provider if you have any problems or questions after your procedure. °WHAT TO EXPECT AFTER THE PROCEDURE °After your procedure, it is typical to have the following sensations: °· Mild discomfort at the catheter insertion site. °HOME CARE INSTRUCTIONS  °· Take all medicines exactly as directed. °· Follow any prescribed diet. °· Follow instructions regarding both rest and physical activity. °· Drink more fluids for the first several days after the procedure in order to help flush dye from your kidneys. °SEEK MEDICAL CARE IF: °· You develop a rash. °· You have fever not controlled by medicine. °SEEK IMMEDIATE MEDICAL CARE IF: °· There is pain, drainage, bleeding, redness, swelling, warmth or a red streak at the site of the IV tube. °· The extremity where your IV tube was placed becomes discolored, numb, or cool. °· You have difficulty breathing or shortness of breath. °· You develop chest pain. °· You have excessive dizziness or fainting. °  °This information is not intended to replace advice given to you by your health care provider. Make sure you discuss any questions you have with your health care provider. °  °Document Released: 01/10/2013 Document Revised: 03/27/2013 Document Reviewed: 01/10/2013 °Elsevier Interactive Patient Education ©2016 [employer] ° °

## 2015-09-24 NOTE — Sedation Documentation (Signed)
Having pain during angioplasty. Otherwise is comfortable.

## 2015-09-24 NOTE — Telephone Encounter (Signed)
Dr. Earleen Newport @ [facility] is about to treat Mr. Doucette for DVT and wants to discuss with you post care. Please contact him via pager: [phone]

## 2015-09-24 NOTE — Progress Notes (Signed)
Pt arrived for procedure. States he was having urinary retention and hematuria yesterday, saw urologist and foley cath was placed. Foley cath in place with bright, red blood. Pt states urine was bloody yesterday when foley was placed. States he notified urologist office and was told that was normal after having catheter placed.

## 2015-09-25 NOTE — Telephone Encounter (Signed)
Mike Gip given note to call and she called the doctor and she sent me a message to get appt for pt and I sent inbasket today

## 2015-09-29 ENCOUNTER — Other Ambulatory Visit: Payer: Self-pay

## 2015-09-29 ENCOUNTER — Other Ambulatory Visit: Payer: Self-pay | Admitting: *Deleted

## 2015-09-29 DIAGNOSIS — D649 Anemia, unspecified: Secondary | ICD-10-CM

## 2015-09-29 DIAGNOSIS — D6851 Activated protein C resistance: Secondary | ICD-10-CM

## 2015-09-29 DIAGNOSIS — I82402 Acute embolism and thrombosis of unspecified deep veins of left lower extremity: Secondary | ICD-10-CM

## 2015-09-29 MED ORDER — GABAPENTIN 300 MG PO CAPS: 300.0000 mg | ORAL_CAPSULE | Freq: Three times a day (TID) | ORAL | Status: DC

## 2015-09-30 ENCOUNTER — Inpatient Hospital Stay: Payer: Medicare Other | Attending: Hematology and Oncology

## 2015-09-30 ENCOUNTER — Encounter: Payer: Self-pay | Admitting: Hematology and Oncology

## 2015-09-30 ENCOUNTER — Telehealth: Payer: Self-pay

## 2015-09-30 ENCOUNTER — Inpatient Hospital Stay (HOSPITAL_BASED_OUTPATIENT_CLINIC_OR_DEPARTMENT_OTHER): Payer: Medicare Other | Admitting: Hematology and Oncology

## 2015-09-30 VITALS — BP 101/63 | HR 63 | Temp 97.2°F | Resp 16 | Ht 72.0 in | Wt 191.7 lb

## 2015-09-30 DIAGNOSIS — I825Z2 Chronic embolism and thrombosis of unspecified deep veins of left distal lower extremity: Secondary | ICD-10-CM | POA: Diagnosis not present

## 2015-09-30 DIAGNOSIS — I10 Essential (primary) hypertension: Secondary | ICD-10-CM | POA: Insufficient documentation

## 2015-09-30 DIAGNOSIS — I871 Compression of vein: Secondary | ICD-10-CM | POA: Diagnosis not present

## 2015-09-30 DIAGNOSIS — Z79899 Other long term (current) drug therapy: Secondary | ICD-10-CM | POA: Diagnosis not present

## 2015-09-30 DIAGNOSIS — D6851 Activated protein C resistance: Secondary | ICD-10-CM | POA: Diagnosis not present

## 2015-09-30 DIAGNOSIS — H532 Diplopia: Secondary | ICD-10-CM | POA: Diagnosis not present

## 2015-09-30 DIAGNOSIS — Z7901 Long term (current) use of anticoagulants: Secondary | ICD-10-CM | POA: Diagnosis not present

## 2015-09-30 DIAGNOSIS — K219 Gastro-esophageal reflux disease without esophagitis: Secondary | ICD-10-CM

## 2015-09-30 DIAGNOSIS — I82402 Acute embolism and thrombosis of unspecified deep veins of left lower extremity: Secondary | ICD-10-CM

## 2015-09-30 DIAGNOSIS — D649 Anemia, unspecified: Secondary | ICD-10-CM | POA: Insufficient documentation

## 2015-09-30 DIAGNOSIS — D500 Iron deficiency anemia secondary to blood loss (chronic): Secondary | ICD-10-CM

## 2015-09-30 LAB — BASIC METABOLIC PANEL
Anion gap: 6 (ref 5–15)
BUN: 13 mg/dL (ref 6–20)
CO2: 27 mmol/L (ref 22–32)
Calcium: 8.5 mg/dL — ABNORMAL LOW (ref 8.9–10.3)
Chloride: 106 mmol/L (ref 101–111)
Creatinine, Ser: 0.95 mg/dL (ref 0.61–1.24)
GFR calc Af Amer: >60 mL/min (ref >60)
GFR calc non Af Amer: >60 mL/min (ref >60)
Glucose, Bld: 88 mg/dL (ref 65–99)
Potassium: 3.4 mmol/L — ABNORMAL LOW (ref 3.5–5.1)
Sodium: 139 mmol/L (ref 135–145)

## 2015-09-30 LAB — CBC WITH DIFFERENTIAL/PLATELET
Basophils Absolute: 0.1 10*3/uL (ref 0–0.1)
Basophils Relative: 1 %
Eosinophils Absolute: 0.5 10*3/uL (ref 0–0.7)
Eosinophils Relative: 11 %
HCT: 24.8 % — ABNORMAL LOW (ref 40.0–52.0)
Hemoglobin: 8.2 g/dL — ABNORMAL LOW (ref 13.0–18.0)
Lymphocytes Relative: 19 %
Lymphs Abs: 0.9 10*3/uL — ABNORMAL LOW (ref 1.0–3.6)
MCH: 27.1 pg (ref 26.0–34.0)
MCHC: 33.2 g/dL (ref 32.0–36.0)
MCV: 81.5 fL (ref 80.0–100.0)
Monocytes Absolute: 0.4 10*3/uL (ref 0.2–1.0)
Monocytes Relative: 9 %
Neutro Abs: 2.8 10*3/uL (ref 1.4–6.5)
Neutrophils Relative %: 60 %
Platelets: 263 10*3/uL (ref 150–440)
RBC: 3.04 MIL/uL — ABNORMAL LOW (ref 4.40–5.90)
RDW: 13.7 % (ref 11.5–14.5)
WBC: 4.6 10*3/uL (ref 3.8–10.6)

## 2015-09-30 LAB — IRON AND TIBC
Iron: 10 ug/dL — ABNORMAL LOW (ref 45–182)
Saturation Ratios: 3 % — ABNORMAL LOW (ref 17.9–39.5)
TIBC: 321 ug/dL (ref 250–450)
UIBC: 311 ug/dL

## 2015-09-30 LAB — FERRITIN: Ferritin: 9 ng/mL — ABNORMAL LOW (ref 24–336)

## 2015-09-30 NOTE — Progress Notes (Signed)
Prostate surgery was done in the first of June. Had trigeminal nerve surgery in the beginning of Feb.  No other concerns.
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
## 2015-09-30 NOTE — Telephone Encounter (Signed)
Called pt left VM for pt to return our phone call rgarding his labs today

## 2015-10-01 ENCOUNTER — Telehealth: Payer: Self-pay

## 2015-10-01 NOTE — Telephone Encounter (Signed)
Called pt per MD:   Ferritin 7 (low) after blood loss. Goal ferritin 100.    Patient encouraged to eat iron rich foods. He was also going to try oral iron with OJ or vitamin C, but concern for constipation and straining given prostate procedure and bleeding.   He has an appt next week for labs. If he has any worrisome symptoms (chest pain, SOB, dizziness, recurrent bleeding), he is to call.   Reported above to pt and explained to pt oral Iron is OTC.  Pt wrote it all down and insured that he would call us with any concerns

## 2015-10-05 ENCOUNTER — Other Ambulatory Visit: Payer: Self-pay | Admitting: Family Medicine

## 2015-10-06 ENCOUNTER — Inpatient Hospital Stay: Payer: Medicare Other | Attending: Hematology and Oncology

## 2015-10-06 DIAGNOSIS — D6851 Activated protein C resistance: Secondary | ICD-10-CM | POA: Insufficient documentation

## 2015-10-06 DIAGNOSIS — Z7901 Long term (current) use of anticoagulants: Secondary | ICD-10-CM | POA: Insufficient documentation

## 2015-10-06 DIAGNOSIS — I82599 Chronic embolism and thrombosis of other specified deep vein of unspecified lower extremity: Secondary | ICD-10-CM | POA: Diagnosis not present

## 2015-10-06 DIAGNOSIS — D649 Anemia, unspecified: Secondary | ICD-10-CM

## 2015-10-06 DIAGNOSIS — Z8042 Family history of malignant neoplasm of prostate: Secondary | ICD-10-CM | POA: Insufficient documentation

## 2015-10-06 DIAGNOSIS — Z79899 Other long term (current) drug therapy: Secondary | ICD-10-CM | POA: Diagnosis not present

## 2015-10-06 DIAGNOSIS — Z85828 Personal history of other malignant neoplasm of skin: Secondary | ICD-10-CM | POA: Insufficient documentation

## 2015-10-06 DIAGNOSIS — D509 Iron deficiency anemia, unspecified: Secondary | ICD-10-CM | POA: Insufficient documentation

## 2015-10-06 DIAGNOSIS — Z951 Presence of aortocoronary bypass graft: Secondary | ICD-10-CM | POA: Diagnosis not present

## 2015-10-06 DIAGNOSIS — I10 Essential (primary) hypertension: Secondary | ICD-10-CM | POA: Diagnosis not present

## 2015-10-06 DIAGNOSIS — Z7982 Long term (current) use of aspirin: Secondary | ICD-10-CM | POA: Insufficient documentation

## 2015-10-06 DIAGNOSIS — Z87891 Personal history of nicotine dependence: Secondary | ICD-10-CM | POA: Diagnosis not present

## 2015-10-06 DIAGNOSIS — N029 Recurrent and persistent hematuria with unspecified morphologic changes: Secondary | ICD-10-CM | POA: Diagnosis not present

## 2015-10-06 DIAGNOSIS — I871 Compression of vein: Secondary | ICD-10-CM | POA: Diagnosis not present

## 2015-10-06 DIAGNOSIS — Z800 Family history of malignant neoplasm of digestive organs: Secondary | ICD-10-CM | POA: Insufficient documentation

## 2015-10-06 DIAGNOSIS — K219 Gastro-esophageal reflux disease without esophagitis: Secondary | ICD-10-CM | POA: Insufficient documentation

## 2015-10-06 LAB — CBC WITH DIFFERENTIAL/PLATELET
Basophils Absolute: 0.1 10*3/uL (ref 0–0.1)
Basophils Relative: 1 %
Eosinophils Absolute: 0.4 10*3/uL (ref 0–0.7)
Eosinophils Relative: 10 %
HCT: 28.1 % — ABNORMAL LOW (ref 40.0–52.0)
Hemoglobin: 9.3 g/dL — ABNORMAL LOW (ref 13.0–18.0)
Lymphocytes Relative: 23 %
Lymphs Abs: 0.9 10*3/uL — ABNORMAL LOW (ref 1.0–3.6)
MCH: 26.7 pg (ref 26.0–34.0)
MCHC: 33.1 g/dL (ref 32.0–36.0)
MCV: 80.8 fL (ref 80.0–100.0)
Monocytes Absolute: 0.5 10*3/uL (ref 0.2–1.0)
Monocytes Relative: 11 %
Neutro Abs: 2.2 10*3/uL (ref 1.4–6.5)
Neutrophils Relative %: 55 %
Platelets: 241 10*3/uL (ref 150–440)
RBC: 3.48 MIL/uL — ABNORMAL LOW (ref 4.40–5.90)
RDW: 14.4 % (ref 11.5–14.5)
WBC: 4 10*3/uL (ref 3.8–10.6)

## 2015-10-06 LAB — RETICULOCYTES
RBC.: 3.48 MIL/uL — ABNORMAL LOW (ref 4.40–5.90)
Retic Count, Absolute: 167 10*3/uL (ref 19.0–183.0)
Retic Ct Pct: 4.8 % — ABNORMAL HIGH (ref 0.4–3.1)

## 2015-10-06 LAB — SAMPLE TO BLOOD BANK

## 2015-10-06 LAB — FERRITIN: Ferritin: 27 ng/mL (ref 24–336)

## 2015-10-13 DIAGNOSIS — H40013 Open angle with borderline findings, low risk, bilateral: Secondary | ICD-10-CM | POA: Diagnosis not present

## 2015-10-13 DIAGNOSIS — H01021 Squamous blepharitis right upper eyelid: Secondary | ICD-10-CM | POA: Diagnosis not present

## 2015-10-13 DIAGNOSIS — H01025 Squamous blepharitis left lower eyelid: Secondary | ICD-10-CM | POA: Diagnosis not present

## 2015-10-13 DIAGNOSIS — H2513 Age-related nuclear cataract, bilateral: Secondary | ICD-10-CM | POA: Diagnosis not present

## 2015-10-13 DIAGNOSIS — H01024 Squamous blepharitis left upper eyelid: Secondary | ICD-10-CM | POA: Diagnosis not present

## 2015-10-13 DIAGNOSIS — H01022 Squamous blepharitis right lower eyelid: Secondary | ICD-10-CM | POA: Diagnosis not present

## 2015-10-15 ENCOUNTER — Other Ambulatory Visit: Payer: Self-pay | Admitting: Interventional Radiology

## 2015-10-15 DIAGNOSIS — I82422 Acute embolism and thrombosis of left iliac vein: Secondary | ICD-10-CM

## 2015-10-31 ENCOUNTER — Other Ambulatory Visit: Payer: Self-pay

## 2015-10-31 DIAGNOSIS — D500 Iron deficiency anemia secondary to blood loss (chronic): Secondary | ICD-10-CM
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
## 2015-11-03 ENCOUNTER — Other Ambulatory Visit: Payer: Self-pay | Admitting: *Deleted

## 2015-11-03 ENCOUNTER — Encounter (INDEPENDENT_AMBULATORY_CARE_PROVIDER_SITE_OTHER): Payer: Self-pay

## 2015-11-03 ENCOUNTER — Inpatient Hospital Stay (HOSPITAL_BASED_OUTPATIENT_CLINIC_OR_DEPARTMENT_OTHER): Payer: Medicare Other | Admitting: Hematology and Oncology

## 2015-11-03 ENCOUNTER — Inpatient Hospital Stay: Payer: Medicare Other | Admitting: *Deleted

## 2015-11-03 VITALS — BP 105/60 | HR 57 | Temp 97.7°F | Resp 18 | Wt 189.3 lb

## 2015-11-03 DIAGNOSIS — D500 Iron deficiency anemia secondary to blood loss (chronic): Secondary | ICD-10-CM

## 2015-11-03 DIAGNOSIS — Z7901 Long term (current) use of anticoagulants: Secondary | ICD-10-CM

## 2015-11-03 DIAGNOSIS — I82422 Acute embolism and thrombosis of left iliac vein: Secondary | ICD-10-CM

## 2015-11-03 DIAGNOSIS — N029 Recurrent and persistent hematuria with unspecified morphologic changes: Secondary | ICD-10-CM

## 2015-11-03 DIAGNOSIS — R310 Gross hematuria: Secondary | ICD-10-CM | POA: Diagnosis not present

## 2015-11-03 DIAGNOSIS — D509 Iron deficiency anemia, unspecified: Secondary | ICD-10-CM | POA: Diagnosis not present

## 2015-11-03 DIAGNOSIS — I871 Compression of vein: Secondary | ICD-10-CM | POA: Diagnosis not present

## 2015-11-03 DIAGNOSIS — I82599 Chronic embolism and thrombosis of other specified deep vein of unspecified lower extremity: Secondary | ICD-10-CM | POA: Diagnosis not present

## 2015-11-03 DIAGNOSIS — Z79899 Other long term (current) drug therapy: Secondary | ICD-10-CM | POA: Diagnosis not present

## 2015-11-03 DIAGNOSIS — R319 Hematuria, unspecified: Secondary | ICD-10-CM

## 2015-11-03 DIAGNOSIS — Z85828 Personal history of other malignant neoplasm of skin: Secondary | ICD-10-CM

## 2015-11-03 DIAGNOSIS — D6851 Activated protein C resistance: Secondary | ICD-10-CM | POA: Diagnosis not present

## 2015-11-03 DIAGNOSIS — I10 Essential (primary) hypertension: Secondary | ICD-10-CM

## 2015-11-03 DIAGNOSIS — Z951 Presence of aortocoronary bypass graft: Secondary | ICD-10-CM

## 2015-11-03 DIAGNOSIS — K219 Gastro-esophageal reflux disease without esophagitis: Secondary | ICD-10-CM

## 2015-11-03 DIAGNOSIS — Z7982 Long term (current) use of aspirin: Secondary | ICD-10-CM

## 2015-11-03 DIAGNOSIS — Z87891 Personal history of nicotine dependence: Secondary | ICD-10-CM

## 2015-11-03 LAB — IRON AND TIBC
Iron: 71 ug/dL (ref 45–182)
Saturation Ratios: 23 % (ref 17.9–39.5)
TIBC: 305 ug/dL (ref 250–450)
UIBC: 234 ug/dL

## 2015-11-03 LAB — CBC WITH DIFFERENTIAL/PLATELET
Basophils Absolute: 0.1 10*3/uL (ref 0–0.1)
Basophils Relative: 2 %
Eosinophils Absolute: 0.3 10*3/uL (ref 0–0.7)
Eosinophils Relative: 9 %
HCT: 39.7 % — ABNORMAL LOW (ref 40.0–52.0)
Hemoglobin: 13.2 g/dL (ref 13.0–18.0)
Lymphocytes Relative: 28 %
Lymphs Abs: 1 10*3/uL (ref 1.0–3.6)
MCH: 28.1 pg (ref 26.0–34.0)
MCHC: 33.3 g/dL (ref 32.0–36.0)
MCV: 84.3 fL (ref 80.0–100.0)
Monocytes Absolute: 0.4 10*3/uL (ref 0.2–1.0)
Monocytes Relative: 10 %
Neutro Abs: 1.9 10*3/uL (ref 1.4–6.5)
Neutrophils Relative %: 51 %
Platelets: 186 10*3/uL (ref 150–440)
RBC: 4.71 MIL/uL (ref 4.40–5.90)
RDW: 16.7 % — ABNORMAL HIGH (ref 11.5–14.5)
WBC: 3.6 10*3/uL — ABNORMAL LOW (ref 3.8–10.6)

## 2015-11-03 LAB — URINALYSIS COMPLETE WITH MICROSCOPIC (ARMC ONLY)
Bilirubin Urine: NEGATIVE
Glucose, UA: NEGATIVE mg/dL
Ketones, ur: NEGATIVE mg/dL
Leukocytes, UA: NEGATIVE
Nitrite: NEGATIVE
Protein, ur: 30 mg/dL — AB
Specific Gravity, Urine: 1.011 (ref 1.005–1.030)
Squamous Epithelial / LPF: NONE SEEN
pH: 6 (ref 5.0–8.0)

## 2015-11-03 LAB — COMPREHENSIVE METABOLIC PANEL
ALT: 13 U/L — ABNORMAL LOW (ref 17–63)
AST: 17 U/L (ref 15–41)
Albumin: 4.4 g/dL (ref 3.5–5.0)
Alkaline Phosphatase: 61 U/L (ref 38–126)
Anion gap: 5 (ref 5–15)
BUN: 20 mg/dL (ref 6–20)
CO2: 27 mmol/L (ref 22–32)
Calcium: 9.2 mg/dL (ref 8.9–10.3)
Chloride: 104 mmol/L (ref 101–111)
Creatinine, Ser: 1.07 mg/dL (ref 0.61–1.24)
GFR calc Af Amer: >60 mL/min (ref >60)
GFR calc non Af Amer: >60 mL/min (ref >60)
Glucose, Bld: 89 mg/dL (ref 65–99)
Potassium: 4.2 mmol/L (ref 3.5–5.1)
Sodium: 136 mmol/L (ref 135–145)
Total Bilirubin: 0.6 mg/dL (ref 0.3–1.2)
Total Protein: 6.7 g/dL (ref 6.5–8.1)

## 2015-11-03 LAB — FERRITIN: Ferritin: 16 ng/mL — ABNORMAL LOW (ref 24–336)

## 2015-11-03 NOTE — Progress Notes (Signed)
Patient is here for follow up, reports doing well, no complaints today.

## 2015-11-04 LAB — URINE CULTURE: Culture: NO GROWTH

## 2015-11-05 ENCOUNTER — Encounter: Payer: Self-pay | Admitting: Hematology and Oncology

## 2015-11-06 ENCOUNTER — Ambulatory Visit
Admission: RE | Admit: 2015-11-06 | Discharge: 2015-11-06 | Disposition: A | Discharge status: Home / Self Care | Payer: Medicare Other | Source: Ambulatory Visit | Attending: Interventional Radiology | Admitting: Interventional Radiology

## 2015-11-06 DIAGNOSIS — I82422 Acute embolism and thrombosis of left iliac vein: Secondary | ICD-10-CM | POA: Diagnosis not present
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
## 2015-11-20 DIAGNOSIS — L218 Other seborrheic dermatitis: Secondary | ICD-10-CM | POA: Diagnosis not present

## 2015-11-20 DIAGNOSIS — Z85828 Personal history of other malignant neoplasm of skin: Secondary | ICD-10-CM | POA: Diagnosis not present

## 2015-11-20 DIAGNOSIS — L821 Other seborrheic keratosis: Secondary | ICD-10-CM | POA: Diagnosis not present

## 2015-11-20 DIAGNOSIS — L570 Actinic keratosis: Secondary | ICD-10-CM | POA: Diagnosis not present

## 2015-11-20 DIAGNOSIS — D1801 Hemangioma of skin and subcutaneous tissue: Secondary | ICD-10-CM | POA: Diagnosis not present

## 2015-11-20 DIAGNOSIS — C44311 Basal cell carcinoma of skin of nose: Secondary | ICD-10-CM | POA: Diagnosis not present

## 2015-11-20 DIAGNOSIS — D692 Other nonthrombocytopenic purpura: Secondary | ICD-10-CM | POA: Diagnosis not present

## 2015-11-20 DIAGNOSIS — D485 Neoplasm of uncertain behavior of skin: Secondary | ICD-10-CM | POA: Diagnosis not present

## 2015-12-09 DIAGNOSIS — C44311 Basal cell carcinoma of skin of nose: Secondary | ICD-10-CM | POA: Diagnosis not present

## 2015-12-09 DIAGNOSIS — Z85828 Personal history of other malignant neoplasm of skin: Secondary | ICD-10-CM | POA: Diagnosis not present

## 2015-12-11 ENCOUNTER — Ambulatory Visit: Payer: Medicare Other | Admitting: Hematology and Oncology

## 2015-12-15 ENCOUNTER — Ambulatory Visit (INDEPENDENT_AMBULATORY_CARE_PROVIDER_SITE_OTHER): Payer: Medicare Other | Admitting: Neurology

## 2015-12-15 ENCOUNTER — Encounter: Payer: Self-pay | Admitting: Neurology

## 2015-12-15 VITALS — BP 106/66 | HR 68 | Ht 72.0 in | Wt 195.0 lb

## 2015-12-15 DIAGNOSIS — R200 Anesthesia of skin: Secondary | ICD-10-CM

## 2015-12-15 DIAGNOSIS — G500 Trigeminal neuralgia: Secondary | ICD-10-CM | POA: Diagnosis not present

## 2015-12-15 DIAGNOSIS — R208 Other disturbances of skin sensation: Secondary | ICD-10-CM | POA: Diagnosis not present

## 2015-12-15 MED ORDER — GABAPENTIN 300 MG PO CAPS: 600.0000 mg | ORAL_CAPSULE | Freq: Three times a day (TID) | ORAL | 4 refills | Status: DC
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
## 2015-12-15 NOTE — Patient Instructions (Signed)
[name]  Increase gabapentin 300mg  to 2 pills three times daily.  Contact me in 4 weeks with update and we can still increase dose if needed. Follow up after the new year.

## 2015-12-19 DIAGNOSIS — N323 Diverticulum of bladder: Secondary | ICD-10-CM | POA: Diagnosis not present

## 2015-12-19 DIAGNOSIS — N400 Enlarged prostate without lower urinary tract symptoms: Secondary | ICD-10-CM | POA: Diagnosis not present

## 2015-12-23 DIAGNOSIS — Z6826 Body mass index (BMI) 26.0-26.9, adult: Secondary | ICD-10-CM | POA: Diagnosis not present

## 2015-12-23 DIAGNOSIS — G500 Trigeminal neuralgia: Secondary | ICD-10-CM | POA: Diagnosis not present

## 2015-12-29 DIAGNOSIS — G500 Trigeminal neuralgia: Secondary | ICD-10-CM | POA: Diagnosis not present

## 2016-01-01 DIAGNOSIS — G9389 Other specified disorders of brain: Secondary | ICD-10-CM | POA: Diagnosis not present

## 2016-01-01 DIAGNOSIS — G500 Trigeminal neuralgia: Secondary | ICD-10-CM | POA: Diagnosis not present

## 2016-01-06 ENCOUNTER — Other Ambulatory Visit: Payer: Self-pay | Admitting: Neurosurgery

## 2016-01-06 ENCOUNTER — Ambulatory Visit (INDEPENDENT_AMBULATORY_CARE_PROVIDER_SITE_OTHER): Payer: Medicare Other

## 2016-01-06 ENCOUNTER — Other Ambulatory Visit: Payer: Self-pay | Admitting: Family Medicine

## 2016-01-06 DIAGNOSIS — Z6826 Body mass index (BMI) 26.0-26.9, adult: Secondary | ICD-10-CM | POA: Diagnosis not present

## 2016-01-06 DIAGNOSIS — Z23 Encounter for immunization: Secondary | ICD-10-CM | POA: Diagnosis not present

## 2016-01-06 DIAGNOSIS — G500 Trigeminal neuralgia: Secondary | ICD-10-CM | POA: Diagnosis not present

## 2016-02-03 ENCOUNTER — Ambulatory Visit
Admission: RE | Admit: 2016-02-03 | Discharge: 2016-02-03 | Disposition: A | Discharge status: Home / Self Care | Payer: Medicare Other | Source: Ambulatory Visit | Attending: Hematology and Oncology | Admitting: Hematology and Oncology

## 2016-02-03 ENCOUNTER — Inpatient Hospital Stay (HOSPITAL_BASED_OUTPATIENT_CLINIC_OR_DEPARTMENT_OTHER): Payer: Medicare Other | Admitting: Hematology and Oncology

## 2016-02-03 ENCOUNTER — Encounter: Payer: Self-pay | Admitting: Hematology and Oncology

## 2016-02-03 ENCOUNTER — Telehealth: Payer: Self-pay | Admitting: *Deleted

## 2016-02-03 ENCOUNTER — Inpatient Hospital Stay: Payer: Medicare Other | Attending: Hematology and Oncology

## 2016-02-03 ENCOUNTER — Other Ambulatory Visit: Payer: Self-pay | Admitting: *Deleted

## 2016-02-03 VITALS — HR 67 | Temp 98.4°F | Resp 18 | Wt 192.7 lb

## 2016-02-03 DIAGNOSIS — Z7901 Long term (current) use of anticoagulants: Secondary | ICD-10-CM

## 2016-02-03 DIAGNOSIS — R200 Anesthesia of skin: Secondary | ICD-10-CM | POA: Diagnosis not present

## 2016-02-03 DIAGNOSIS — D500 Iron deficiency anemia secondary to blood loss (chronic): Secondary | ICD-10-CM

## 2016-02-03 DIAGNOSIS — Z85828 Personal history of other malignant neoplasm of skin: Secondary | ICD-10-CM | POA: Diagnosis not present

## 2016-02-03 DIAGNOSIS — K219 Gastro-esophageal reflux disease without esophagitis: Secondary | ICD-10-CM | POA: Diagnosis not present

## 2016-02-03 DIAGNOSIS — I82512 Chronic embolism and thrombosis of left femoral vein: Secondary | ICD-10-CM | POA: Insufficient documentation

## 2016-02-03 DIAGNOSIS — R05 Cough: Secondary | ICD-10-CM

## 2016-02-03 DIAGNOSIS — Z7982 Long term (current) use of aspirin: Secondary | ICD-10-CM | POA: Diagnosis not present

## 2016-02-03 DIAGNOSIS — N323 Diverticulum of bladder: Secondary | ICD-10-CM | POA: Diagnosis not present

## 2016-02-03 DIAGNOSIS — M2578 Osteophyte, vertebrae: Secondary | ICD-10-CM | POA: Insufficient documentation

## 2016-02-03 DIAGNOSIS — I82502 Chronic embolism and thrombosis of unspecified deep veins of left lower extremity: Secondary | ICD-10-CM

## 2016-02-03 DIAGNOSIS — H532 Diplopia: Secondary | ICD-10-CM | POA: Diagnosis not present

## 2016-02-03 DIAGNOSIS — I10 Essential (primary) hypertension: Secondary | ICD-10-CM

## 2016-02-03 DIAGNOSIS — Z800 Family history of malignant neoplasm of digestive organs: Secondary | ICD-10-CM | POA: Diagnosis not present

## 2016-02-03 DIAGNOSIS — I871 Compression of vein: Secondary | ICD-10-CM

## 2016-02-03 DIAGNOSIS — Z79899 Other long term (current) drug therapy: Secondary | ICD-10-CM | POA: Insufficient documentation

## 2016-02-03 DIAGNOSIS — N400 Enlarged prostate without lower urinary tract symptoms: Secondary | ICD-10-CM | POA: Insufficient documentation

## 2016-02-03 DIAGNOSIS — Z87891 Personal history of nicotine dependence: Secondary | ICD-10-CM | POA: Insufficient documentation

## 2016-02-03 DIAGNOSIS — R059 Cough, unspecified: Secondary | ICD-10-CM

## 2016-02-03 DIAGNOSIS — Z86718 Personal history of other venous thrombosis and embolism: Secondary | ICD-10-CM

## 2016-02-03 DIAGNOSIS — D6851 Activated protein C resistance: Secondary | ICD-10-CM | POA: Insufficient documentation

## 2016-02-03 DIAGNOSIS — Z8042 Family history of malignant neoplasm of prostate: Secondary | ICD-10-CM | POA: Insufficient documentation

## 2016-02-03 LAB — CBC WITH DIFFERENTIAL/PLATELET
Basophils Absolute: 0.1 10*3/uL (ref 0–0.1)
Basophils Relative: 1 %
Eosinophils Absolute: 0.6 10*3/uL (ref 0–0.7)
Eosinophils Relative: 9 %
HCT: 46.7 % (ref 40.0–52.0)
Hemoglobin: 15.7 g/dL (ref 13.0–18.0)
Lymphocytes Relative: 15 %
Lymphs Abs: 1.1 10*3/uL (ref 1.0–3.6)
MCH: 28.2 pg (ref 26.0–34.0)
MCHC: 33.6 g/dL (ref 32.0–36.0)
MCV: 83.8 fL (ref 80.0–100.0)
Monocytes Absolute: 0.7 10*3/uL (ref 0.2–1.0)
Monocytes Relative: 9 %
Neutro Abs: 4.7 10*3/uL (ref 1.4–6.5)
Neutrophils Relative %: 66 %
Platelets: 184 10*3/uL (ref 150–440)
RBC: 5.57 MIL/uL (ref 4.40–5.90)
RDW: 14.5 % (ref 11.5–14.5)
WBC: 7 10*3/uL (ref 3.8–10.6)

## 2016-02-03 LAB — COMPREHENSIVE METABOLIC PANEL
ALT: 14 U/L — ABNORMAL LOW (ref 17–63)
AST: 17 U/L (ref 15–41)
Albumin: 4.6 g/dL (ref 3.5–5.0)
Alkaline Phosphatase: 69 U/L (ref 38–126)
Anion gap: 6 (ref 5–15)
BUN: 17 mg/dL (ref 6–20)
CO2: 27 mmol/L (ref 22–32)
Calcium: 9.1 mg/dL (ref 8.9–10.3)
Chloride: 106 mmol/L (ref 101–111)
Creatinine, Ser: 1 mg/dL (ref 0.61–1.24)
GFR calc Af Amer: >60 mL/min (ref >60)
GFR calc non Af Amer: >60 mL/min (ref >60)
Glucose, Bld: 100 mg/dL — ABNORMAL HIGH (ref 65–99)
Potassium: 4.3 mmol/L (ref 3.5–5.1)
Sodium: 139 mmol/L (ref 135–145)
Total Bilirubin: 0.7 mg/dL (ref 0.3–1.2)
Total Protein: 7.1 g/dL (ref 6.5–8.1)

## 2016-02-03 LAB — IRON AND TIBC
Iron: 44 ug/dL — ABNORMAL LOW (ref 45–182)
Saturation Ratios: 17 % — ABNORMAL LOW (ref 17.9–39.5)
TIBC: 266 ug/dL (ref 250–450)
UIBC: 222 ug/dL

## 2016-02-03 LAB — FERRITIN: Ferritin: 53 ng/mL (ref 24–336)
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
## 2016-02-03 NOTE — Telephone Encounter (Signed)
Called patient to inform him CXR is normal.  Patient verbalized understanding.

## 2016-02-03 NOTE — Telephone Encounter (Signed)
-----   Message from Lequita Asal, MD sent at 02/03/2016 12:13 PM EDT ----- Regarding: Please call patient  CXR is normal.  M ----- Message ----- From: Interface, Rad Results In Sent: 02/03/2016  11:35 AM To: Lequita Asal, MD

## 2016-02-05 ENCOUNTER — Encounter: Payer: Self-pay | Admitting: Family Medicine

## 2016-02-05 ENCOUNTER — Ambulatory Visit (INDEPENDENT_AMBULATORY_CARE_PROVIDER_SITE_OTHER): Payer: Medicare Other | Admitting: Family Medicine

## 2016-02-05 DIAGNOSIS — R059 Cough, unspecified: Secondary | ICD-10-CM | POA: Insufficient documentation

## 2016-02-05 DIAGNOSIS — R05 Cough: Secondary | ICD-10-CM | POA: Diagnosis not present

## 2016-02-05 MED ORDER — BENZONATATE 200 MG PO CAPS: 200.0000 mg | ORAL_CAPSULE | Freq: Three times a day (TID) | ORAL | 0 refills | Status: DC | PRN

## 2016-02-05 MED ORDER — AZITHROMYCIN 250 MG PO TABS: ORAL_TABLET | ORAL | 0 refills | Status: DC

## 2016-02-05 NOTE — Assessment & Plan Note (Signed)
Nontoxic, but given the duration and the rhonchi, I would treat.  D/w pt.  Start zmax and tessalon.  Update me as needed.  He agrees.  Okay for outpatient f/u.

## 2016-02-05 NOTE — Progress Notes (Signed)
He has planned f/u with Dr. Christella Noa for later this year.  Still with L sided facial sx and decrease in hearing on the L side.    Urology update: urinating but still with incomplete bladder emptying with uro f/u pending.  Not needing to cath now.    duration of symptoms: >2 weeks, into 3rd week now.  rhinorrhea:no congestion:yes ear pain:no sore throat:no Cough:yes, with some sputum.  Myalgias: no Fevers: no  Recent CXR with no pneumonia, CHF, nor other acute cardiopulmonary abnormality.  Per HPI unless specifically indicated in ROS section   Meds, vitals, and allergies reviewed.   GEN: nad, alert and oriented HEENT: mucous membranes moist, TM w/o erythema, nasal epithelium slightly injected, OP with mild cobblestoning NECK: supple w/o LA CV: rrr. PULM: coarse BS B with B but L>R rhonchi, no inc wob ABD: soft, +bs EXT: no edema

## 2016-02-05 NOTE — Progress Notes (Signed)
Pre visit review using our clinic review tool, if applicable. No additional management support is needed unless otherwise documented below in the visit note. 

## 2016-02-05 NOTE — Patient Instructions (Signed)
Start zithromax and use tessalon for the cough.  Take care.  Glad to see you. Rest and fluids in the meantime.  Update me if not getting any better.

## 2016-02-10 ENCOUNTER — Ambulatory Visit (INDEPENDENT_AMBULATORY_CARE_PROVIDER_SITE_OTHER): Payer: Medicare Other | Admitting: Family Medicine

## 2016-02-10 ENCOUNTER — Encounter: Payer: Self-pay | Admitting: Family Medicine

## 2016-02-10 DIAGNOSIS — R05 Cough: Secondary | ICD-10-CM

## 2016-02-10 DIAGNOSIS — R059 Cough, unspecified: Secondary | ICD-10-CM

## 2016-02-10 MED ORDER — AMOXICILLIN-POT CLAVULANATE 875-125 MG PO TABS: 1.0000 | ORAL_TABLET | Freq: Two times a day (BID) | ORAL | 0 refills | Status: DC

## 2016-02-10 MED ORDER — LISINOPRIL 40 MG PO TABS: 20.0000 mg | ORAL_TABLET | Freq: Every day | ORAL | Status: DC

## 2016-02-10 MED ORDER — ALBUTEROL SULFATE HFA 108 (90 BASE) MCG/ACT IN AERS: 2.0000 | INHALATION_SPRAY | Freq: Four times a day (QID) | RESPIRATORY_TRACT | 0 refills | Status: DC | PRN

## 2016-02-10 NOTE — Progress Notes (Signed)
Cough continues.  Discolored sputum, yellowish; prev was clearing up then got discolored again.  Now with more head congestion.  No fevers.  No vomiting.    Low BP noted. Dec in PO intake since taste is affected.  Still on ACE.    Still with L sided facial pain, unfortunately at baseline.   Meds, vitals, and allergies reviewed.   ROS: Per HPI unless specifically indicated in ROS section   GEN: nad, alert and oriented HEENT: mucous membranes moist, tm w/o erythema, nasal exam w/o erythema, clear discharge noted,  OP with cobblestoning, sinuses not ttp x4 NECK: supple w/o LA CV: rrr.   PULM: B basilar rhonchi but o/w ctab, no inc wob EXT: no edema

## 2016-02-10 NOTE — Progress Notes (Signed)
Pre visit review using our clinic review tool, if applicable. No additional management support is needed unless otherwise documented below in the visit note. 

## 2016-02-10 NOTE — Patient Instructions (Signed)
Likely bronchitis.  Change to augmentin.  Use albuterol inhaler for the cough.  In the meantime, cut the lisinopril back to 1/2 tab a day.  When feeling better, can go back to 1 tab a day if BP is elevated.  Take care.  Glad to see you.  Update me as needed.

## 2016-02-11 NOTE — Assessment & Plan Note (Signed)
Likely bronchitis. No focal decrease in breath sounds. Nontoxic. Change to Augmentin. Use albuterol as needed for cough. can update me in a few days. He agrees. Okay for outpatient follow-up.

## 2016-02-25 ENCOUNTER — Ambulatory Visit (INDEPENDENT_AMBULATORY_CARE_PROVIDER_SITE_OTHER): Payer: Medicare Other | Admitting: Family Medicine

## 2016-02-25 ENCOUNTER — Encounter: Payer: Self-pay | Admitting: Family Medicine

## 2016-02-25 DIAGNOSIS — R059 Cough, unspecified: Secondary | ICD-10-CM

## 2016-02-25 DIAGNOSIS — R05 Cough: Secondary | ICD-10-CM | POA: Diagnosis not present

## 2016-02-25 MED ORDER — ALBUTEROL SULFATE HFA 108 (90 BASE) MCG/ACT IN AERS: 1.0000 | INHALATION_SPRAY | Freq: Four times a day (QID) | RESPIRATORY_TRACT | 1 refills | Status: DC | PRN

## 2016-02-25 MED ORDER — HYDROCORTISONE VALERATE 0.2 % EX CREA: 1.0000 "application " | TOPICAL_CREAM | Freq: Two times a day (BID) | CUTANEOUS | 0 refills | Status: DC | PRN

## 2016-02-25 MED ORDER — BENZONATATE 200 MG PO CAPS: 200.0000 mg | ORAL_CAPSULE | Freq: Three times a day (TID) | ORAL | 1 refills | Status: DC | PRN

## 2016-02-25 NOTE — Progress Notes (Signed)
BP controlled with 40mg  of lisinopril now.  No ADE on med.    Cough. Used SABA inhaler some, out of med now.  Done with abx.  Still with taste changes noted.  Less sputum but still with coughing fits.  No fevers.  No vomiting, no diarrhea.    He has used hydrocort valerate on irritated skin on his ears over the years.  Needed refill.  See AVS.   Meds, vitals, and allergies reviewed.   ROS: Per HPI unless specifically indicated in ROS section   nad ncat Mmm TM wnl Neck supple, no LA rrr Ctab, no cough, no wheeze.  abd soft Ext w/o edema.

## 2016-02-25 NOTE — Progress Notes (Signed)
Pre visit review using our clinic review tool, if applicable. No additional management support is needed unless otherwise documented below in the visit note. 

## 2016-02-25 NOTE — Assessment & Plan Note (Signed)
Lungs are clear. Likely a postinfectious cough. See after visit summary. Supportive care. Follow-up as needed. Nontoxic. He agrees.

## 2016-02-25 NOTE — Patient Instructions (Signed)
Restart the cough pills and the inhaler.  Cough should gradually get better.  Update me if this isn't the case.  Ask about OTC hydrocortisone cream (1%) if the valerate cream is really expensive.   Take care.  Glad to see you.  Happy thanksgiving.

## 2016-03-02 ENCOUNTER — Telehealth: Payer: Self-pay | Admitting: *Deleted

## 2016-03-02 NOTE — Telephone Encounter (Signed)
Faxed request received from [place], [facility] for [place] 0.2% Cream.  PA began on [facility] but requires some info that could not be located in chart.  Printout of questions placed in Dr. Josefine Class In [place].

## 2016-03-08 ENCOUNTER — Telehealth: Payer: Self-pay | Admitting: *Deleted

## 2016-03-08 ENCOUNTER — Encounter: Payer: Self-pay | Admitting: Family Medicine

## 2016-03-08 DIAGNOSIS — R238 Other skin changes: Secondary | ICD-10-CM | POA: Insufficient documentation

## 2016-03-08 NOTE — Telephone Encounter (Signed)
Elmo Putt with [facility] Medicare left a voicemail stating that they need additional clinical information regarding the prior authorization on the cream. They need to know what kind of skin changes there are and if patient has a cortisteroid dermatology responsive dermatitis? Please call back with this information [phone] opt 5

## 2016-03-08 NOTE — Telephone Encounter (Signed)
Information was given to [place] at [facility].

## 2016-03-08 NOTE — Telephone Encounter (Signed)
Skin changes- recurrent irritation on the ears.  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   Thanks.

## 2016-03-08 NOTE — Telephone Encounter (Signed)
PA submitted thru CMM, awaiting response. 

## 2016-03-09 NOTE — Telephone Encounter (Signed)
Denial letter received and given to Dr. Damita Dunnings.

## 2016-03-09 NOTE — Telephone Encounter (Signed)
[employer] left v/m that valerate cream has been denied due to not on formulary; letter of denial to follow.

## 2016-03-10 ENCOUNTER — Other Ambulatory Visit: Payer: Self-pay | Admitting: Family Medicine

## 2016-03-10 MED ORDER — TRIAMCINOLONE ACETONIDE 0.1 % EX CREA: 1.0000 "application " | TOPICAL_CREAM | Freq: Two times a day (BID) | CUTANEOUS | 1 refills | Status: DC | PRN

## 2016-03-10 NOTE — Progress Notes (Signed)
Notify patient. Hydrocortisone valerate cream authorization denied. Change to triamcinolone. This is a reasonable substitution. Should work the same way. May be cheaper for patient. Prescription sent. Update me as needed. Thanks.

## 2016-03-10 NOTE — Progress Notes (Signed)
Patient notified as instructed by telephone and verbalized understanding. 

## 2016-03-17 ENCOUNTER — Encounter: Payer: Self-pay | Admitting: Hematology and Oncology

## 2016-03-17 ENCOUNTER — Other Ambulatory Visit: Payer: Self-pay | Admitting: Family Medicine

## 2016-03-17 NOTE — Telephone Encounter (Signed)
Received refill electronically Last refill 02/15/16 Last office visit 02/25/16

## 2016-03-18 NOTE — Telephone Encounter (Signed)
Sent. Thanks.   

## 2016-03-23 ENCOUNTER — Encounter (HOSPITAL_COMMUNITY): Payer: Self-pay

## 2016-03-23 ENCOUNTER — Ambulatory Visit (INDEPENDENT_AMBULATORY_CARE_PROVIDER_SITE_OTHER): Payer: Medicare Other | Admitting: Family Medicine

## 2016-03-23 ENCOUNTER — Other Ambulatory Visit: Payer: Self-pay | Admitting: Neurosurgery

## 2016-03-23 ENCOUNTER — Inpatient Hospital Stay (HOSPITAL_COMMUNITY): Payer: Medicare Other | Admitting: Emergency Medicine

## 2016-03-23 ENCOUNTER — Inpatient Hospital Stay (HOSPITAL_COMMUNITY): Payer: Medicare Other | Admitting: Anesthesiology

## 2016-03-23 ENCOUNTER — Encounter: Payer: Self-pay | Admitting: Family Medicine

## 2016-03-23 DIAGNOSIS — J0100 Acute maxillary sinusitis, unspecified: Secondary | ICD-10-CM | POA: Insufficient documentation

## 2016-03-23 MED ORDER — AMOXICILLIN-POT CLAVULANATE 875-125 MG PO TABS: 1.0000 | ORAL_TABLET | Freq: Two times a day (BID) | ORAL | 0 refills | Status: DC

## 2016-03-23 NOTE — Patient Instructions (Addendum)
Start the antibiotics today and I'll await the preop notes.  Take care.  Glad to see you.

## 2016-03-23 NOTE — Assessment & Plan Note (Signed)
Given the exam, would treat.  His surgery is another consideration.  CTAB, nontoxic.  I'll defer to surgery and anesthesia, as his condition may change between now and then.  D/w pt.  He understood. Okay for outpatient f/u.  App help of all involved.

## 2016-03-23 NOTE — Pre-Procedure Instructions (Signed)
[name]  03/23/2016      [facility] [place], [place] - [name] [place] [address] Phone: [phone] Fax: [phone]    Your procedure is scheduled on Friday, December 29..  Report to [facility] [place] Admitting at 6:00 AM                    Your Atkinson or procedure is scheduled for 9:15 AM   Call this number if you have problems the morning of Atkinson: [phone]   Remember:  Do not eat food or drink liquids after midnight Thursday, December 28.  Take these medicines the morning of Atkinson with A SIP OF WATER: gabapentin (NEURONTIN), tamsulosin (FLOMAX).                  May use Eye Drops and may use Albuterol Inhaler if needed and bring Inhaler tot the Atkinson with you.                     May take benzonatate [name]) if needed.                                   Stop Aspirin and Xarelto as instructed by your surgeon.                           1 Week prior to Atkinson STOP taking Aspirin Products (Goody Powder, Excedrin Migraine), Ibuprofen (Advil), Naproxen (Aleve), Vitamins and Herbal Products (ie Fish Oil)                        [name]  Before Atkinson, you can play an important role. Because skin is not sterile, your skin needs to be as free of germs as possible. You can reduce the number of germs on your skin by washing with CHG (chlorahexidine gluconate) Soap before Atkinson.  CHG is an antiseptic cleaner which kills germs and bonds with the skin to continue killing germs even after washing.  Please do not use if you have an allergy to CHG or antibacterial soaps. If your skin becomes reddened/irritated stop using the CHG.  Do not shave (including legs and underarms) for at least 48 hours prior to first CHG shower. It is OK to shave your face.  Please follow these instructions carefully.   1. Shower the NIGHT BEFORE Atkinson and the MORNING OF Atkinson with CHG.    2. If you chose to wash your hair, wash your hair first as usual with your normal shampoo.  3. After you shampoo, rinse your hair and body thoroughly to remove the shampoo.  4. Use CHG as you would any other liquid soap. You can apply CHG directly to the skin and wash gently with a scrungie or a clean washcloth.   5. Apply the CHG Soap to your body ONLY FROM THE NECK DOWN.  Do not use on open wounds or open sores. Avoid contact with your eyes, ears, mouth and genitals (private parts). Wash genitals (private parts) with your normal soap.  6. Wash thoroughly, paying special attention to the area where your Atkinson will be performed.  7. Thoroughly rinse your body with warm water from the neck down.  8. DO NOT shower/wash with your normal soap after using and rinsing  off the CHG Soap.  9. Pat yourself dry with a CLEAN TOWEL.   10. Wear CLEAN PAJAMAS   11. Place CLEAN SHEETS on your bed the night of your first shower and DO NOT SLEEP WITH PETS.  Day of Atkinson: Do not apply any deodorants/lotions. Please wear clean clothes to the Atkinson/Atkinson center.    Do not wear jewelry, make-up or nail polish.  Do not wear lotions, powders, or perfumes, or deodorant.  Men may shave face and neck.  Do not bring valuables to the Atkinson.  [name] is not responsible for any belongings or valuables.  Contacts, dentures or bridgework may not be worn into Atkinson.  Leave your suitcase in the car.  After Atkinson it may be brought to your room.  For patients admitted to the Atkinson, discharge time will be determined by your treatment team.  Patients discharged the day of Atkinson will not be allowed to drive home.   Name and phone number of your driver: -   Please read over the following fact sheets that you were given: Coughing and Deep Breathing and Pain Booklet

## 2016-03-23 NOTE — Progress Notes (Signed)
He has surgery planned for 03/2916.  He has pre op eval tomorrow.  Still on xarelto.    Sx started a few days ago.  Facial pain B with congestion.  Tooth pain.  No fevers.  No ear pain.  Still with some cough.  No sputum now, prev with clear sputum.  No vomiting, no diarrhea.    Meds, vitals, and allergies reviewed.   ROS: Per HPI unless specifically indicated in ROS section   GEN: nad, alert and oriented HEENT: mucous membranes moist, tm w/o erythema, nasal exam w/o erythema, clear discharge noted,  OP with cobblestoning, max sinuses ttp B NECK: supple w/o LA CV: rrr.   PULM: ctab, no inc wob EXT: no edema

## 2016-03-24 ENCOUNTER — Telehealth: Payer: Self-pay

## 2016-03-24 ENCOUNTER — Telehealth: Payer: Self-pay | Admitting: Internal Medicine

## 2016-03-24 ENCOUNTER — Encounter (HOSPITAL_COMMUNITY)
Admission: RE | Admit: 2016-03-24 | Discharge: 2016-03-24 | Disposition: A | Discharge status: Home / Self Care | Payer: Medicare Other | Source: Ambulatory Visit | Attending: Neurosurgery | Admitting: Neurosurgery

## 2016-03-24 ENCOUNTER — Telehealth: Payer: Self-pay | Admitting: *Deleted

## 2016-03-24 ENCOUNTER — Other Ambulatory Visit: Payer: Self-pay | Admitting: Internal Medicine

## 2016-03-24 ENCOUNTER — Encounter (HOSPITAL_COMMUNITY): Payer: Self-pay

## 2016-03-24 ENCOUNTER — Other Ambulatory Visit: Payer: Self-pay | Admitting: Neurosurgery

## 2016-03-24 DIAGNOSIS — Z01812 Encounter for preprocedural laboratory examination: Secondary | ICD-10-CM | POA: Insufficient documentation

## 2016-03-24 HISTORY — DX: Anemia, unspecified: D64.9

## 2016-03-24 LAB — BASIC METABOLIC PANEL
ANION GAP: 5 (ref 5–15)
BUN: 17 mg/dL (ref 6–20)
CHLORIDE: 109 mmol/L (ref 101–111)
CO2: 26 mmol/L (ref 22–32)
Calcium: 9.2 mg/dL (ref 8.9–10.3)
Creatinine, Ser: 0.95 mg/dL (ref 0.61–1.24)
GFR calc non Af Amer: >60 mL/min (ref >60)
Glucose, Bld: 104 mg/dL — ABNORMAL HIGH (ref 65–99)
POTASSIUM: 4 mmol/L (ref 3.5–5.1)
SODIUM: 140 mmol/L (ref 135–145)

## 2016-03-24 LAB — CBC
HEMATOCRIT: 43 % (ref 39.0–52.0)
Hemoglobin: 14.7 g/dL (ref 13.0–17.0)
MCH: 29.5 pg (ref 26.0–34.0)
MCHC: 34.2 g/dL (ref 30.0–36.0)
MCV: 86.2 fL (ref 78.0–100.0)
Platelets: 184 10*3/uL (ref 150–400)
RBC: 4.99 MIL/uL (ref 4.22–5.81)
RDW: 13.1 % (ref 11.5–15.5)
WBC: 4.6 10*3/uL (ref 4.0–10.5)

## 2016-03-24 LAB — TYPE AND SCREEN
ABO/RH(D): AB POS
ANTIBODY SCREEN: NEGATIVE

## 2016-03-24 LAB — SURGICAL PCR SCREEN
MRSA, PCR: NEGATIVE
Staphylococcus aureus: NEGATIVE

## 2016-03-24 LAB — ABO/RH: ABO/RH(D): AB POS

## 2016-03-24 MED ORDER — ENOXAPARIN SODIUM 40 MG/0.4ML ~~LOC~~ SOLN: SUBCUTANEOUS | 0 refills | Status: DC

## 2016-03-24 MED ORDER — ENOXAPARIN SODIUM 40 MG/0.4ML ~~LOC~~ SOLN: 40.0000 mg | SUBCUTANEOUS | 0 refills | Status: DC

## 2016-03-24 NOTE — Telephone Encounter (Signed)
Pt left v/m; pt is having surgery 04/02/16 and pt was advised by surgeon to stop Xarelto on 03/26/16. Pt wants to know if should be on lovenox while off Xarelto. Pt request cb.

## 2016-03-24 NOTE — Progress Notes (Signed)
Called pt and went over instructions to take xarelto through 12/22, then start lovwnox 40 daily 12/23through 12/26.  Then no medicine [id number] and 12/28 then surgery 12/29., will check with corcoran at that point from surgery to see what to do after that. Pt is agreeable. He will have surgery in [place]

## 2016-03-24 NOTE — Telephone Encounter (Signed)
Patient has been scheduled for surgery on 04/02/2016 and has been told by surgeon to stop XARLTO on 03/26/2016. He wants to know if he should be put on Lovenox? If yes who should prescribe.

## 2016-03-24 NOTE — Telephone Encounter (Signed)
Spoke to [place] at Dr. Lacy Duverney office and was advised that he is not in the office, but given his number to page him. [phone] Dr. Christella Noa paged. Dr. Christella Noa paged per Dr. Damita Dunnings.
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
## 2016-03-24 NOTE — Telephone Encounter (Signed)
Would use lovenox 40mg  a day.  Start the day after stopping xarelto.  Last dose of lovenox the day before surgery.   rx sent.   I'll defer restart of xarelto to Dr. Christella Noa, depending on surgical course.   I appreciate the help of all involved.  Routed to DR. Cabbell in the meantime.   Thanks.

## 2016-03-24 NOTE — Telephone Encounter (Signed)
Dr B discussed and advised sherry what directions for the patient

## 2016-03-24 NOTE — Telephone Encounter (Signed)
After Dr. Josefine Class conversation with Dr. Christella Noa I called the patient and advised him that Dr. Damita Dunnings will be working on his Lovenox bridge. Advised patient that his last dose of Lovenox will be the day before his surgery per Dr. Damita Dunnings.

## 2016-03-24 NOTE — Telephone Encounter (Signed)
Review the chart in detail; recommend starting Lovenox 40 mg subcutaneous/prophylactic dose once a day; 12/23 [ start day after xarelto]. STOP lovenox on 12/26; check Dr.Corcoran re: re-staring of xarelto.

## 2016-03-24 NOTE — Telephone Encounter (Signed)
Dr. Mike Gip is on vacation I am routing this to on-call Dr. Burlene Arnt.

## 2016-03-24 NOTE — Telephone Encounter (Signed)
Please call Dr. Christella Noa and make sure he is okay with lovenox bridge for this patient.  We'll go from there.   I'm okay with doing it if Dr. Christella Noa is okay with it.   Thanks.

## 2016-03-25 NOTE — Telephone Encounter (Signed)
Dr. Rogue Bussing covering for corcoran rcvd a message that pt was having surgery 12/29 and that surgeon told pt to stop xarelto 12/22.  Pt wanted to [facility] if he needs to go on lovenox and Dr. Jacinto Reap response was yes. He will need to be on lovenox starting 12/23, and take it through 12/26.  Then be off medicine 12/27 and 12/28. Have surgery 12/29 and pt will have surgery in [place].  Check with Corcoran on 12/29 for more decisions.  Called pt and let him know above and he said he already got rx for lovenox with instructions and wanted to know what to do after surgery and I told him that I am sending note to Clyde who will rtn to work 12/28 and let her know that you will need guidance for medication after surgery 12/29 and what hospital you will be in and pt agreeable to plan. He has been on lovenox in past and already knows how to give injections.

## 2016-03-25 NOTE — Telephone Encounter (Signed)
Left a message to call back and ask for [name], or [name].

## 2016-03-25 NOTE — Telephone Encounter (Signed)
Spoke to pt

## 2016-03-31 ENCOUNTER — Other Ambulatory Visit: Payer: Self-pay | Admitting: Family Medicine

## 2016-04-02 ENCOUNTER — Encounter (HOSPITAL_COMMUNITY): Payer: Self-pay | Admitting: *Deleted

## 2016-04-02 ENCOUNTER — Ambulatory Visit (HOSPITAL_COMMUNITY)
Admission: RE | Admit: 2016-04-02 | Discharge: 2016-04-02 | Disposition: A | Discharge status: Home / Self Care | Payer: Medicare Other | Source: Ambulatory Visit | Attending: Neurosurgery | Admitting: Neurosurgery

## 2016-04-02 ENCOUNTER — Encounter (HOSPITAL_COMMUNITY)
Admission: RE | Disposition: A | Discharge status: Home / Self Care | Payer: Self-pay | Source: Ambulatory Visit | Attending: Neurosurgery

## 2016-04-02 DIAGNOSIS — K219 Gastro-esophageal reflux disease without esophagitis: Secondary | ICD-10-CM | POA: Insufficient documentation

## 2016-04-02 DIAGNOSIS — Z86718 Personal history of other venous thrombosis and embolism: Secondary | ICD-10-CM | POA: Diagnosis not present

## 2016-04-02 DIAGNOSIS — G500 Trigeminal neuralgia: Secondary | ICD-10-CM | POA: Insufficient documentation

## 2016-04-02 DIAGNOSIS — Z8249 Family history of ischemic heart disease and other diseases of the circulatory system: Secondary | ICD-10-CM | POA: Insufficient documentation

## 2016-04-02 DIAGNOSIS — I10 Essential (primary) hypertension: Secondary | ICD-10-CM | POA: Insufficient documentation

## 2016-04-02 DIAGNOSIS — Z87891 Personal history of nicotine dependence: Secondary | ICD-10-CM | POA: Diagnosis not present

## 2016-04-02 DIAGNOSIS — D649 Anemia, unspecified: Secondary | ICD-10-CM | POA: Insufficient documentation

## 2016-04-02 LAB — PROTIME-INR
INR: 1.01
Prothrombin Time: 13.3 seconds (ref 11.4–15.2)

## 2016-04-02 SURGERY — CRANIECTOMY FOR TIC DOULOUREUX
Anesthesia: General | Laterality: Left

## 2016-04-02 MED ORDER — HEMOSTATIC AGENTS (NO CHARGE) OPTIME
TOPICAL | Status: DC | PRN
  Administered 2016-04-02: 1 via TOPICAL

## 2016-04-02 MED ORDER — BACITRACIN ZINC 500 UNIT/GM EX OINT
TOPICAL_OINTMENT | CUTANEOUS | Status: AC
Start: 2016-04-02 — End: 2016-04-02
  Filled 2016-04-02: qty 28.35

## 2016-04-02 MED ORDER — PROPOFOL 10 MG/ML IV BOLUS
INTRAVENOUS | Status: AC
Start: 2016-04-02 — End: ?
  Filled 2016-04-02: qty 40

## 2016-04-02 MED ORDER — THROMBIN 20000 UNITS EX SOLR
CUTANEOUS | Status: AC
  Filled 2016-04-02: qty 20000

## 2016-04-02 MED ORDER — LIDOCAINE-EPINEPHRINE (PF) 2 %-1:200000 IJ SOLN
INTRAMUSCULAR | Status: AC
  Filled 2016-04-02: qty 20

## 2016-04-02 MED ORDER — THROMBIN 20000 UNITS EX SOLR
CUTANEOUS | Status: DC | PRN
  Administered 2016-04-02: 20 mL via TOPICAL

## 2016-04-02 MED ORDER — MICROFIBRILLAR COLL HEMOSTAT EX POWD
CUTANEOUS | Status: AC
  Filled 2016-04-02: qty 5

## 2016-04-02 MED ORDER — CEFAZOLIN SODIUM-DEXTROSE 2-4 GM/100ML-% IV SOLN
2.0000 g | INTRAVENOUS | Status: DC
  Filled 2016-04-02: qty 100

## 2016-04-02 MED ORDER — FENTANYL CITRATE (PF) 100 MCG/2ML IJ SOLN
INTRAMUSCULAR | Status: AC
  Filled 2016-04-02: qty 4

## 2016-04-02 MED ORDER — BACITRACIN ZINC 500 UNIT/GM EX OINT
TOPICAL_OINTMENT | CUTANEOUS | Status: DC | PRN
  Administered 2016-04-02: 1 via TOPICAL

## 2016-04-02 MED ORDER — SODIUM CHLORIDE 0.9 % IJ SOLN
INTRAMUSCULAR | Status: AC
  Filled 2016-04-02: qty 10

## 2016-04-02 MED ORDER — 0.9 % SODIUM CHLORIDE (POUR BTL) OPTIME
TOPICAL | Status: DC | PRN
  Administered 2016-04-02 (×2): 1000 mL

## 2016-04-02 MED ORDER — MIDAZOLAM HCL 2 MG/2ML IJ SOLN
INTRAMUSCULAR | Status: AC
  Filled 2016-04-02: qty 2

## 2016-04-02 MED ORDER — CHLORHEXIDINE GLUCONATE CLOTH 2 % EX PADS: 6.0000 | MEDICATED_PAD | Freq: Once | CUTANEOUS | Status: DC

## 2016-04-02 MED ORDER — LACTATED RINGERS IV SOLN
INTRAVENOUS | Status: DC
  Administered 2016-04-02: 07:00:00 via INTRAVENOUS

## 2016-04-02 NOTE — Anesthesia Preprocedure Evaluation (Deleted)
Anesthesia Evaluation  Patient identified by MRN, date of birth, ID band Patient awake    Reviewed: Allergy & Precautions, NPO status , Patient's Chart, lab work & pertinent test results  History of Anesthesia Complications (+) PONV and history of anesthetic complications  Airway Mallampati: II  TM Distance: >3 FB Neck ROM: Full    Dental  (+) Dental Advisory Given, Partial Upper, Partial Lower   Pulmonary former smoker,    Pulmonary exam normal breath sounds clear to auscultation       Cardiovascular hypertension, Pt. on medications + Peripheral Vascular Disease and + DVT  Normal cardiovascular exam Rhythm:Regular Rate:Normal     Neuro/Psych  Headaches, Trigeminal neuralgia   Neuromuscular disease negative psych ROS   GI/Hepatic Neg liver ROS, GERD  Medicated,  Endo/Other  negative endocrine ROS  Renal/GU negative Renal ROS     Musculoskeletal negative musculoskeletal ROS (+)   Abdominal   Peds  Hematology  (+) Blood dyscrasia (Xarelto), , May-Thurner syndrome, heterozygosity for Factor V Leiden   Anesthesia Other Findings Day of surgery medications reviewed with the patient.  Reproductive/Obstetrics                            Anesthesia Physical Anesthesia Plan  ASA: II  Anesthesia Plan: General   Post-op Pain Management:    Induction: Intravenous  Airway Management Planned: Oral ETT  Additional Equipment: Arterial line  Intra-op Plan:   Post-operative Plan: Extubation in OR  Informed Consent: I have reviewed the patients History and Physical, chart, labs and discussed the procedure including the risks, benefits and alternatives for the proposed anesthesia with the patient or authorized representative who has indicated his/her understanding and acceptance.   Dental advisory given  Plan Discussed with: CRNA  Anesthesia Plan Comments: (Risks/benefits of general  anesthesia discussed with patient including risk of damage to teeth, lips, gum, and tongue, nausea/vomiting, allergic reactions to medications, and the possibility of heart attack, stroke and death.  All patient questions answered.  Patient wishes to proceed.)        Anesthesia Quick Evaluation

## 2016-04-02 NOTE — Progress Notes (Signed)
A Line D/C. Pressure held for 5 min.Occlusive dressing applied. Site WNL. Pt tolerated well.
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
## 2016-04-02 NOTE — Progress Notes (Signed)
Pt changed back into his clothes. D/C in stable condition

## 2016-04-09 NOTE — Discharge Summary (Signed)
[name] did not undergo surgery. His description of his symptoms seemed to be more consistent with anesthesia dolorosa which I did not believe at this time would be helped by surgery. He went home from the preop area.

## 2016-05-17 DIAGNOSIS — G500 Trigeminal neuralgia: Secondary | ICD-10-CM | POA: Diagnosis not present

## 2016-05-17 DIAGNOSIS — Z6826 Body mass index (BMI) 26.0-26.9, adult: Secondary | ICD-10-CM | POA: Diagnosis not present

## 2016-05-18 ENCOUNTER — Encounter: Payer: Self-pay | Admitting: Neurology

## 2016-05-18 ENCOUNTER — Ambulatory Visit (INDEPENDENT_AMBULATORY_CARE_PROVIDER_SITE_OTHER): Payer: Medicare Other | Admitting: Neurology

## 2016-05-18 VITALS — BP 106/72 | HR 57 | Ht 72.0 in | Wt 195.5 lb

## 2016-05-18 DIAGNOSIS — R200 Anesthesia of skin: Secondary | ICD-10-CM

## 2016-05-18 NOTE — Patient Instructions (Signed)
Anesthesia delorosa  Gradually get off of the gabapentin and see how you do If facial numbness gets worse, then contact me and you can restart the gabapentin Follow up in 3 months.
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
## 2016-05-19 DIAGNOSIS — Z85828 Personal history of other malignant neoplasm of skin: Secondary | ICD-10-CM | POA: Diagnosis not present

## 2016-05-19 DIAGNOSIS — D485 Neoplasm of uncertain behavior of skin: Secondary | ICD-10-CM | POA: Diagnosis not present

## 2016-05-19 DIAGNOSIS — L570 Actinic keratosis: Secondary | ICD-10-CM | POA: Diagnosis not present

## 2016-06-01 ENCOUNTER — Ambulatory Visit: Payer: Medicare Other | Admitting: Hematology and Oncology

## 2016-06-01 ENCOUNTER — Other Ambulatory Visit: Payer: Medicare Other

## 2016-06-01 ENCOUNTER — Encounter: Payer: Self-pay | Admitting: Family Medicine

## 2016-06-01 ENCOUNTER — Ambulatory Visit (INDEPENDENT_AMBULATORY_CARE_PROVIDER_SITE_OTHER): Payer: Medicare Other | Admitting: Family Medicine

## 2016-06-01 DIAGNOSIS — J069 Acute upper respiratory infection, unspecified: Secondary | ICD-10-CM

## 2016-06-01 NOTE — Progress Notes (Signed)
He is tapering down on gabapentin.   Cough.  Sx started about 4 days ago.  Some better in the meantime.  Taking mucinex/nyquil/vick's vaporub.  He prev had some yellow sputum.  No fevers.  He is clearly better in the meantime.  Still with some occ cough.    Meds, vitals, and allergies reviewed.   ROS: Per HPI unless specifically indicated in ROS section   GEN: nad, alert and oriented HEENT: mucous membranes moist, tm w/o erythema, nasal exam w/o erythema, clear discharge noted,  OP with cobblestoning NECK: supple w/o LA CV: rrr.   PULM: ctab, no inc wob EXT: no edema SKIN: no acute rash

## 2016-06-01 NOTE — Patient Instructions (Signed)
You should gradually get better. If notably worse in the meantime then let me know.  Take care.  Glad to see you.

## 2016-06-01 NOTE — Progress Notes (Signed)
Pre visit review using our clinic review tool, if applicable. No additional management support is needed unless otherwise documented below in the visit note. 

## 2016-06-02 DIAGNOSIS — J069 Acute upper respiratory infection, unspecified: Secondary | ICD-10-CM | POA: Insufficient documentation

## 2016-06-02 NOTE — Assessment & Plan Note (Signed)
Clearly better in the meantime. No other intervention needed, other than supportive care. Follow-up as needed. He agrees.

## 2016-06-10 ENCOUNTER — Inpatient Hospital Stay (HOSPITAL_BASED_OUTPATIENT_CLINIC_OR_DEPARTMENT_OTHER): Payer: Medicare Other | Admitting: Hematology and Oncology

## 2016-06-10 ENCOUNTER — Inpatient Hospital Stay: Payer: Medicare Other | Attending: Hematology and Oncology

## 2016-06-10 ENCOUNTER — Other Ambulatory Visit: Payer: Self-pay

## 2016-06-10 ENCOUNTER — Telehealth: Payer: Self-pay | Admitting: *Deleted

## 2016-06-10 VITALS — BP 117/53 | HR 96 | Temp 96.9°F | Resp 18 | Wt 196.2 lb

## 2016-06-10 DIAGNOSIS — D500 Iron deficiency anemia secondary to blood loss (chronic): Secondary | ICD-10-CM

## 2016-06-10 DIAGNOSIS — Z7901 Long term (current) use of anticoagulants: Secondary | ICD-10-CM | POA: Insufficient documentation

## 2016-06-10 DIAGNOSIS — Z8042 Family history of malignant neoplasm of prostate: Secondary | ICD-10-CM | POA: Diagnosis not present

## 2016-06-10 DIAGNOSIS — K219 Gastro-esophageal reflux disease without esophagitis: Secondary | ICD-10-CM

## 2016-06-10 DIAGNOSIS — Z85828 Personal history of other malignant neoplasm of skin: Secondary | ICD-10-CM | POA: Diagnosis not present

## 2016-06-10 DIAGNOSIS — Z800 Family history of malignant neoplasm of digestive organs: Secondary | ICD-10-CM | POA: Diagnosis not present

## 2016-06-10 DIAGNOSIS — I10 Essential (primary) hypertension: Secondary | ICD-10-CM | POA: Insufficient documentation

## 2016-06-10 DIAGNOSIS — H9192 Unspecified hearing loss, left ear: Secondary | ICD-10-CM | POA: Diagnosis not present

## 2016-06-10 DIAGNOSIS — H532 Diplopia: Secondary | ICD-10-CM | POA: Insufficient documentation

## 2016-06-10 DIAGNOSIS — R05 Cough: Secondary | ICD-10-CM | POA: Diagnosis not present

## 2016-06-10 DIAGNOSIS — I871 Compression of vein: Secondary | ICD-10-CM

## 2016-06-10 DIAGNOSIS — Z7982 Long term (current) use of aspirin: Secondary | ICD-10-CM | POA: Diagnosis not present

## 2016-06-10 DIAGNOSIS — D6851 Activated protein C resistance: Secondary | ICD-10-CM

## 2016-06-10 DIAGNOSIS — R200 Anesthesia of skin: Secondary | ICD-10-CM | POA: Diagnosis not present

## 2016-06-10 DIAGNOSIS — Z86718 Personal history of other venous thrombosis and embolism: Secondary | ICD-10-CM | POA: Insufficient documentation

## 2016-06-10 DIAGNOSIS — Z79899 Other long term (current) drug therapy: Secondary | ICD-10-CM

## 2016-06-10 DIAGNOSIS — Z87891 Personal history of nicotine dependence: Secondary | ICD-10-CM | POA: Insufficient documentation

## 2016-06-10 LAB — CBC WITH DIFFERENTIAL/PLATELET
Basophils Absolute: 0 10*3/uL (ref 0–0.1)
Basophils Relative: 1 %
Eosinophils Absolute: 0.4 10*3/uL (ref 0–0.7)
Eosinophils Relative: 9 %
HCT: 44.5 % (ref 40.0–52.0)
Hemoglobin: 15.3 g/dL (ref 13.0–18.0)
Lymphocytes Relative: 29 %
Lymphs Abs: 1.2 10*3/uL (ref 1.0–3.6)
MCH: 29.3 pg (ref 26.0–34.0)
MCHC: 34.4 g/dL (ref 32.0–36.0)
MCV: 85.2 fL (ref 80.0–100.0)
Monocytes Absolute: 0.3 10*3/uL (ref 0.2–1.0)
Monocytes Relative: 8 %
Neutro Abs: 2.2 10*3/uL (ref 1.4–6.5)
Neutrophils Relative %: 53 %
Platelets: 190 10*3/uL (ref 150–440)
RBC: 5.23 MIL/uL (ref 4.40–5.90)
RDW: 13.7 % (ref 11.5–14.5)
WBC: 4.2 10*3/uL (ref 3.8–10.6)

## 2016-06-10 LAB — COMPREHENSIVE METABOLIC PANEL
ALT: 15 U/L — ABNORMAL LOW (ref 17–63)
AST: 18 U/L (ref 15–41)
Albumin: 4.5 g/dL (ref 3.5–5.0)
Alkaline Phosphatase: 61 U/L (ref 38–126)
Anion gap: 5 (ref 5–15)
BUN: 17 mg/dL (ref 6–20)
CO2: 26 mmol/L (ref 22–32)
Calcium: 8.9 mg/dL (ref 8.9–10.3)
Chloride: 107 mmol/L (ref 101–111)
Creatinine, Ser: 0.9 mg/dL (ref 0.61–1.24)
GFR calc Af Amer: >60 mL/min (ref >60)
GFR calc non Af Amer: >60 mL/min (ref >60)
Glucose, Bld: 92 mg/dL (ref 65–99)
Potassium: 4.1 mmol/L (ref 3.5–5.1)
Sodium: 138 mmol/L (ref 135–145)
Total Bilirubin: 0.6 mg/dL (ref 0.3–1.2)
Total Protein: 6.8 g/dL (ref 6.5–8.1)

## 2016-06-10 LAB — FERRITIN: Ferritin: 36 ng/mL (ref 24–336)

## 2016-06-10 NOTE — Progress Notes (Signed)
Patient offers no complaints today. 
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
## 2016-06-10 NOTE — Telephone Encounter (Signed)
Informed of lab results and instructed patient that he could stop taking iron per MD orders and that we would monitor his labs. Instructed pt to call if he bagan to feel worse after stopping medication voiced understanding.

## 2016-06-20 ENCOUNTER — Encounter: Payer: Self-pay | Admitting: Hematology and Oncology

## 2016-06-21 ENCOUNTER — Telehealth: Payer: Self-pay | Admitting: *Deleted

## 2016-06-21 DIAGNOSIS — H2513 Age-related nuclear cataract, bilateral: Secondary | ICD-10-CM | POA: Diagnosis not present

## 2016-06-21 DIAGNOSIS — H40013 Open angle with borderline findings, low risk, bilateral: Secondary | ICD-10-CM | POA: Diagnosis not present

## 2016-06-21 NOTE — Telephone Encounter (Signed)
-----   Message from Lequita Asal, MD sent at 06/20/2016  9:28 AM EDT ----- Regarding: Please call patient  Ensure patient has had follow-up with his urologist.  He has a history of hematuria which caused his iron deficiency.  M

## 2016-06-21 NOTE — Telephone Encounter (Signed)
Called patient and LVM that MD would like him to follow up with his urologist.

## 2016-06-25 DIAGNOSIS — N400 Enlarged prostate without lower urinary tract symptoms: Secondary | ICD-10-CM | POA: Diagnosis not present

## 2016-06-25 DIAGNOSIS — N323 Diverticulum of bladder: Secondary | ICD-10-CM | POA: Diagnosis not present

## 2016-06-25 DIAGNOSIS — N312 Flaccid neuropathic bladder, not elsewhere classified: Secondary | ICD-10-CM | POA: Diagnosis not present

## 2016-06-29 ENCOUNTER — Other Ambulatory Visit: Payer: Self-pay | Admitting: Family Medicine

## 2016-06-29 DIAGNOSIS — N400 Enlarged prostate without lower urinary tract symptoms: Secondary | ICD-10-CM | POA: Diagnosis not present

## 2016-08-16 ENCOUNTER — Encounter: Payer: Self-pay | Admitting: Neurology

## 2016-08-16 ENCOUNTER — Ambulatory Visit (INDEPENDENT_AMBULATORY_CARE_PROVIDER_SITE_OTHER): Payer: Medicare Other | Admitting: Neurology

## 2016-08-16 VITALS — BP 114/80 | HR 47 | Ht 72.0 in | Wt 192.0 lb

## 2016-08-16 DIAGNOSIS — Z8669 Personal history of other diseases of the nervous system and sense organs: Secondary | ICD-10-CM | POA: Diagnosis not present

## 2016-08-16 DIAGNOSIS — R200 Anesthesia of skin: Secondary | ICD-10-CM
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
## 2016-08-16 NOTE — Patient Instructions (Signed)
Follow up as needed

## 2016-09-20 ENCOUNTER — Other Ambulatory Visit: Payer: Self-pay | Admitting: Family Medicine

## 2016-09-20 NOTE — Telephone Encounter (Signed)
Electronic refill request. Xarelto   Last office visit:   06/01/16 Last Filled:    30 tablet 5 03/18/2016  Please advise.

## 2016-09-21 ENCOUNTER — Ambulatory Visit (INDEPENDENT_AMBULATORY_CARE_PROVIDER_SITE_OTHER): Payer: Medicare Other | Admitting: Family Medicine

## 2016-09-21 ENCOUNTER — Encounter: Payer: Self-pay | Admitting: Family Medicine

## 2016-09-21 DIAGNOSIS — L989 Disorder of the skin and subcutaneous tissue, unspecified: Secondary | ICD-10-CM

## 2016-09-21 DIAGNOSIS — I10 Essential (primary) hypertension: Secondary | ICD-10-CM | POA: Diagnosis not present

## 2016-09-21 MED ORDER — LISINOPRIL 40 MG PO TABS: 20.0000 mg | ORAL_TABLET | Freq: Every day | ORAL | Status: DC

## 2016-09-21 NOTE — Telephone Encounter (Signed)
Sent. Thanks.   

## 2016-09-21 NOTE — Patient Instructions (Addendum)
Cut the lisinopril in half and update me as needed.  Goal BP <130/<90.   This is likely a seborrheic keratosis.  Talk to dermatology about your blood thinner and removal.  Take care.  Glad to see you.  Update me as needed.

## 2016-09-21 NOTE — Progress Notes (Signed)
HTN.  Not lightheaded on standing.  Still on lisinopril 40mg  a day.  D/w pt.    Xarelto rx sent this AM, d/w pt. Some expected bruising but no ADE o/w.    Still with abnormal sensation on the L side of the face, off gabapentin in the meantime. His is tolerating current level of numbness.  When he can get his mind off it, he tends to do better.    Growth on abd wall noted.  <1cm lesion present for about 6 months.  Gradually getting bigger.  Not painful but can get caught and irritated on clothes.    L eye follow up.  Has seen eye clinic in the meantime, with tx for glaucoma per eye clinic. He has f/u pending.  No vision changes.    Meds, vitals, and allergies reviewed.   ROS: Per HPI unless specifically indicated in ROS section   nad ncat Neck supple, no LA rrr ctab abd soft Likely SK <1cm on the abd wall.   Ext w/o edema.

## 2016-09-22 DIAGNOSIS — L989 Disorder of the skin and subcutaneous tissue, unspecified: Secondary | ICD-10-CM | POA: Insufficient documentation

## 2016-09-22 NOTE — Assessment & Plan Note (Signed)
Likely relatively overtreated. Reasonable to cut lisinopril back to 20 mg a day and update me as needed. Routine cautions given. See after visit summary. He agrees.

## 2016-09-22 NOTE — Assessment & Plan Note (Signed)
Likely benign [place] keratosis. It does get caught on his clothes. I did not want to excise this given his blood thinner. He will talk with dermatology about options. He agrees.

## 2016-10-11 ENCOUNTER — Other Ambulatory Visit: Payer: Medicare Other

## 2016-10-11 ENCOUNTER — Ambulatory Visit: Payer: Medicare Other | Admitting: Hematology and Oncology

## 2016-10-25 DIAGNOSIS — H2513 Age-related nuclear cataract, bilateral: Secondary | ICD-10-CM | POA: Diagnosis not present

## 2016-10-25 DIAGNOSIS — H40013 Open angle with borderline findings, low risk, bilateral: Secondary | ICD-10-CM | POA: Diagnosis not present

## 2016-10-29 DIAGNOSIS — L570 Actinic keratosis: Secondary | ICD-10-CM | POA: Diagnosis not present

## 2016-10-29 DIAGNOSIS — D485 Neoplasm of uncertain behavior of skin: Secondary | ICD-10-CM | POA: Diagnosis not present

## 2016-10-29 DIAGNOSIS — Z85828 Personal history of other malignant neoplasm of skin: Secondary | ICD-10-CM | POA: Diagnosis not present

## 2016-10-29 DIAGNOSIS — I788 Other diseases of capillaries: Secondary | ICD-10-CM | POA: Diagnosis not present

## 2016-10-29 DIAGNOSIS — L820 Inflamed seborrheic keratosis: Secondary | ICD-10-CM | POA: Diagnosis not present
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
## 2016-11-01 ENCOUNTER — Inpatient Hospital Stay: Payer: Medicare Other | Attending: Hematology and Oncology | Admitting: Hematology and Oncology

## 2016-11-01 ENCOUNTER — Encounter: Payer: Self-pay | Admitting: Hematology and Oncology

## 2016-11-01 ENCOUNTER — Other Ambulatory Visit: Payer: Self-pay

## 2016-11-01 ENCOUNTER — Inpatient Hospital Stay: Payer: Medicare Other

## 2016-11-01 VITALS — BP 107/74 | HR 52 | Temp 97.4°F | Resp 18 | Wt 188.2 lb

## 2016-11-01 DIAGNOSIS — N323 Diverticulum of bladder: Secondary | ICD-10-CM | POA: Insufficient documentation

## 2016-11-01 DIAGNOSIS — Z86718 Personal history of other venous thrombosis and embolism: Secondary | ICD-10-CM | POA: Insufficient documentation

## 2016-11-01 DIAGNOSIS — I871 Compression of vein: Secondary | ICD-10-CM

## 2016-11-01 DIAGNOSIS — Z85828 Personal history of other malignant neoplasm of skin: Secondary | ICD-10-CM | POA: Insufficient documentation

## 2016-11-01 DIAGNOSIS — Z8042 Family history of malignant neoplasm of prostate: Secondary | ICD-10-CM | POA: Diagnosis not present

## 2016-11-01 DIAGNOSIS — Z800 Family history of malignant neoplasm of digestive organs: Secondary | ICD-10-CM | POA: Diagnosis not present

## 2016-11-01 DIAGNOSIS — H532 Diplopia: Secondary | ICD-10-CM | POA: Diagnosis not present

## 2016-11-01 DIAGNOSIS — Z87891 Personal history of nicotine dependence: Secondary | ICD-10-CM | POA: Diagnosis not present

## 2016-11-01 DIAGNOSIS — K219 Gastro-esophageal reflux disease without esophagitis: Secondary | ICD-10-CM | POA: Diagnosis not present

## 2016-11-01 DIAGNOSIS — Z7982 Long term (current) use of aspirin: Secondary | ICD-10-CM | POA: Diagnosis not present

## 2016-11-01 DIAGNOSIS — N400 Enlarged prostate without lower urinary tract symptoms: Secondary | ICD-10-CM | POA: Insufficient documentation

## 2016-11-01 DIAGNOSIS — D500 Iron deficiency anemia secondary to blood loss (chronic): Secondary | ICD-10-CM

## 2016-11-01 DIAGNOSIS — H40009 Preglaucoma, unspecified, unspecified eye: Secondary | ICD-10-CM | POA: Insufficient documentation

## 2016-11-01 DIAGNOSIS — H9192 Unspecified hearing loss, left ear: Secondary | ICD-10-CM | POA: Insufficient documentation

## 2016-11-01 DIAGNOSIS — I10 Essential (primary) hypertension: Secondary | ICD-10-CM

## 2016-11-01 DIAGNOSIS — Z79899 Other long term (current) drug therapy: Secondary | ICD-10-CM

## 2016-11-01 DIAGNOSIS — D6851 Activated protein C resistance: Secondary | ICD-10-CM

## 2016-11-01 DIAGNOSIS — Z7901 Long term (current) use of anticoagulants: Secondary | ICD-10-CM | POA: Insufficient documentation

## 2016-11-01 DIAGNOSIS — R200 Anesthesia of skin: Secondary | ICD-10-CM | POA: Insufficient documentation

## 2016-11-01 DIAGNOSIS — I82422 Acute embolism and thrombosis of left iliac vein: Secondary | ICD-10-CM

## 2016-11-01 LAB — COMPREHENSIVE METABOLIC PANEL
ALT: 16 U/L — ABNORMAL LOW (ref 17–63)
AST: 20 U/L (ref 15–41)
Albumin: 4.2 g/dL (ref 3.5–5.0)
Alkaline Phosphatase: 54 U/L (ref 38–126)
Anion gap: 6 (ref 5–15)
BUN: 20 mg/dL (ref 6–20)
CO2: 28 mmol/L (ref 22–32)
Calcium: 9.5 mg/dL (ref 8.9–10.3)
Chloride: 105 mmol/L (ref 101–111)
Creatinine, Ser: 1 mg/dL (ref 0.61–1.24)
GFR calc Af Amer: >60 mL/min (ref >60)
GFR calc non Af Amer: >60 mL/min (ref >60)
Glucose, Bld: 84 mg/dL (ref 65–99)
Potassium: 4.3 mmol/L (ref 3.5–5.1)
Sodium: 139 mmol/L (ref 135–145)
Total Bilirubin: 0.8 mg/dL (ref 0.3–1.2)
Total Protein: 6.6 g/dL (ref 6.5–8.1)

## 2016-11-01 LAB — CBC WITH DIFFERENTIAL/PLATELET
Basophils Absolute: 0 10*3/uL (ref 0–0.1)
Basophils Relative: 1 %
Eosinophils Absolute: 0.5 10*3/uL (ref 0–0.7)
Eosinophils Relative: 11 %
HCT: 44.2 % (ref 40.0–52.0)
Hemoglobin: 15.2 g/dL (ref 13.0–18.0)
Lymphocytes Relative: 28 %
Lymphs Abs: 1.2 10*3/uL (ref 1.0–3.6)
MCH: 29.4 pg (ref 26.0–34.0)
MCHC: 34.3 g/dL (ref 32.0–36.0)
MCV: 85.7 fL (ref 80.0–100.0)
Monocytes Absolute: 0.4 10*3/uL (ref 0.2–1.0)
Monocytes Relative: 10 %
Neutro Abs: 2.3 10*3/uL (ref 1.4–6.5)
Neutrophils Relative %: 50 %
Platelets: 192 10*3/uL (ref 150–440)
RBC: 5.16 MIL/uL (ref 4.40–5.90)
RDW: 13 % (ref 11.5–14.5)
WBC: 4.4 10*3/uL (ref 3.8–10.6)

## 2016-11-01 LAB — FERRITIN: Ferritin: 52 ng/mL (ref 24–336)

## 2016-11-01 NOTE — Progress Notes (Signed)
Patient offers no complaints today. 

## 2016-11-10 ENCOUNTER — Other Ambulatory Visit: Payer: Self-pay | Admitting: Interventional Radiology

## 2016-11-10 DIAGNOSIS — I871 Compression of vein: Secondary | ICD-10-CM

## 2016-11-23 ENCOUNTER — Ambulatory Visit
Admission: RE | Admit: 2016-11-23 | Discharge: 2016-11-23 | Disposition: A | Discharge status: Home / Self Care | Payer: Medicare Other | Source: Ambulatory Visit | Attending: Interventional Radiology | Admitting: Interventional Radiology

## 2016-11-23 DIAGNOSIS — I871 Compression of vein: Secondary | ICD-10-CM

## 2016-11-23 DIAGNOSIS — I82522 Chronic embolism and thrombosis of left iliac vein: Secondary | ICD-10-CM | POA: Diagnosis not present

## 2016-11-23 HISTORY — PX: IR RADIOLOGIST EVAL & MGMT: IMG5224
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
## 2016-12-28 ENCOUNTER — Other Ambulatory Visit: Payer: Self-pay | Admitting: Family Medicine

## 2016-12-28 NOTE — Telephone Encounter (Signed)
Pt has been seen for acute appts only. pls advise

## 2016-12-29 NOTE — Telephone Encounter (Signed)
Sent.  Has CPE pending.  Thanks.

## 2017-01-02 ENCOUNTER — Other Ambulatory Visit: Payer: Self-pay | Admitting: Family Medicine

## 2017-01-02 DIAGNOSIS — Z1159 Encounter for screening for other viral diseases: Secondary | ICD-10-CM

## 2017-01-02 DIAGNOSIS — I10 Essential (primary) hypertension: Secondary | ICD-10-CM

## 2017-01-04 ENCOUNTER — Ambulatory Visit (INDEPENDENT_AMBULATORY_CARE_PROVIDER_SITE_OTHER): Payer: Medicare Other

## 2017-01-04 VITALS — BP 110/78 | HR 50 | Temp 97.6°F | Ht 71.75 in | Wt 189.5 lb

## 2017-01-04 DIAGNOSIS — Z1159 Encounter for screening for other viral diseases: Secondary | ICD-10-CM | POA: Diagnosis not present

## 2017-01-04 DIAGNOSIS — Z23 Encounter for immunization: Secondary | ICD-10-CM | POA: Diagnosis not present

## 2017-01-04 DIAGNOSIS — Z0000 Encounter for general adult medical examination without abnormal findings: Secondary | ICD-10-CM

## 2017-01-04 DIAGNOSIS — I10 Essential (primary) hypertension: Secondary | ICD-10-CM

## 2017-01-04 LAB — LIPID PANEL
CHOL/HDL RATIO: 3
Cholesterol: 160 mg/dL (ref 0–200)
HDL: 54.7 mg/dL (ref >39.00)
LDL Cholesterol: 95 mg/dL (ref 0–99)
NonHDL: 105.52
TRIGLYCERIDES: 51 mg/dL (ref 0.0–149.0)
VLDL: 10.2 mg/dL (ref 0.0–40.0)

## 2017-01-04 NOTE — Progress Notes (Signed)
Pre visit review using our clinic review tool, if applicable. No additional management support is needed unless otherwise documented below in the visit note. 
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
## 2017-01-04 NOTE — Patient Instructions (Addendum)
[name] , Thank you for taking time to come for your Medicare Wellness Visit. I appreciate your ongoing commitment to your health goals. Please review the following plan we discussed and let me know if I can assist you in the future.   These are the goals we discussed: Goals    . Increase physical activity          Starting 01/04/17, I will continue to do yard work for 6-8 hours 3 days per week.        This is a list of the screening recommended for you and due dates:  Health Maintenance  Topic Date Due  . Colon Cancer Screening  05/16/2017  . Tetanus Vaccine  02/03/2025  . Flu Shot  Completed  .  Hepatitis C: One time screening is recommended by Center for Disease Control  (CDC) for  adults born from [birth year] through 1963.   Completed  . Pneumonia vaccines  Completed   Preventive Care for Adults  A healthy lifestyle and preventive care can promote health and wellness. Preventive health guidelines for adults include the following key practices.  . A routine yearly physical is a good way to check with your health care provider about your health and preventive screening. It is a chance to share any concerns and updates on your health and to receive a thorough exam.  . Visit your dentist for a routine exam and preventive care every 6 months. Brush your teeth twice a day and floss once a day. Good oral hygiene prevents tooth decay and gum disease.  . The frequency of eye exams is based on your age, health, family medical history, use  of contact lenses, and other factors. Follow your health care provider's ecommendations for frequency of eye exams.  . Eat a healthy diet. Foods like vegetables, fruits, whole grains, low-fat dairy products, and lean protein foods contain the nutrients you need without too many calories. Decrease your intake of foods high in solid fats, added sugars, and salt. Eat the right amount of calories for you. Get information about a proper diet from your health  care provider, if necessary.  . Regular physical exercise is one of the most important things you can do for your health. Most adults should get at least 150 minutes of moderate-intensity exercise (any activity that increases your heart rate and causes you to sweat) each week. In addition, most adults need muscle-strengthening exercises on 2 or more days a week.  Silver Sneakers may be a benefit available to you. To determine eligibility, you may visit the website: www.silversneakers.com or contact program at (828)010-4071 Mon-Fri between 8AM-8PM.   . Maintain a healthy weight. The body mass index (BMI) is a screening tool to identify possible weight problems. It provides an estimate of body fat based on height and weight. Your health care provider can find your BMI and can help you achieve or maintain a healthy weight.   For adults 20 years and older: ? A BMI below 18.5 is considered underweight. ? A BMI of 18.5 to 24.9 is normal. ? A BMI of 25 to 29.9 is considered overweight. ? A BMI of 30 and above is considered obese.   . Maintain normal blood lipids and cholesterol levels by exercising and minimizing your intake of saturated fat. Eat a balanced diet with plenty of fruit and vegetables. Blood tests for lipids and cholesterol should begin at age 46 and be repeated every 5 years. If your lipid or cholesterol levels are  high, you are over 50, or you are at high risk for heart disease, you may need your cholesterol levels checked more frequently. Ongoing high lipid and cholesterol levels should be treated with medicines if diet and exercise are not working.  . If you smoke, find out from your health care provider how to quit. If you do not use tobacco, please do not start.  . If you choose to drink alcohol, please do not consume more than 2 drinks per day. One drink is considered to be 12 ounces (355 mL) of beer, 5 ounces (148 mL) of wine, or 1.5 ounces (44 mL) of liquor.  . If you are 8-54  years old, ask your health care provider if you should take aspirin to prevent strokes.  . Use sunscreen. Apply sunscreen liberally and repeatedly throughout the day. You should seek shade when your shadow is shorter than you. Protect yourself by wearing long sleeves, pants, a wide-brimmed hat, and sunglasses year round, whenever you are outdoors.  . Once a month, do a whole body skin exam, using a mirror to look at the skin on your back. Tell your health care provider of new moles, moles that have irregular borders, moles that are larger than a pencil eraser, or moles that have changed in shape or color.

## 2017-01-04 NOTE — Progress Notes (Signed)
PCP notes:   Health maintenance:  Hep C screening - completed Flu vaccine - administered PCV13 - administered  Abnormal screenings:   Hearing - failed  Hearing Screening   125Hz  250Hz  500Hz  1000Hz  2000Hz  3000Hz  4000Hz  6000Hz  8000Hz   Right ear:   40 40 40  0    Left ear:   40 0 0  0      Patient concerns:   Shingrix - PCP please discuss at next appt  Nurse concerns:  None  Next PCP appt:   01/11/17 @ 0845  I reviewed health advisor's note, was available for consultation on the day of service listed in this note, and agree with documentation and plan. Elsie Stain, MD.

## 2017-01-05 LAB — HEPATITIS C ANTIBODY
Hepatitis C Ab: NONREACTIVE
SIGNAL TO CUT-OFF: 0.01 (ref <1.00)

## 2017-01-11 ENCOUNTER — Ambulatory Visit (INDEPENDENT_AMBULATORY_CARE_PROVIDER_SITE_OTHER): Payer: Medicare Other | Admitting: Family Medicine

## 2017-01-11 ENCOUNTER — Encounter: Payer: Self-pay | Admitting: Family Medicine

## 2017-01-11 VITALS — BP 110/78 | HR 50 | Temp 97.6°F | Ht 71.75 in | Wt 189.5 lb

## 2017-01-11 DIAGNOSIS — N400 Enlarged prostate without lower urinary tract symptoms: Secondary | ICD-10-CM | POA: Diagnosis not present

## 2017-01-11 DIAGNOSIS — Z0000 Encounter for general adult medical examination without abnormal findings: Secondary | ICD-10-CM

## 2017-01-11 DIAGNOSIS — Z7189 Other specified counseling: Secondary | ICD-10-CM

## 2017-01-11 DIAGNOSIS — I10 Essential (primary) hypertension: Secondary | ICD-10-CM | POA: Diagnosis not present

## 2017-01-11 DIAGNOSIS — I82502 Chronic embolism and thrombosis of unspecified deep veins of left lower extremity: Secondary | ICD-10-CM

## 2017-01-11 DIAGNOSIS — G500 Trigeminal neuralgia: Secondary | ICD-10-CM | POA: Diagnosis not present

## 2017-01-11 NOTE — Progress Notes (Signed)
Hearing screen d/w pt.  Declined hearing aid at this point.   shingrix d/w pt.  See AVS.   Prev intermittent smoking, <30 year pack hx, wouldn't qualify for lung CA screening program.   D/w pt.  He understood.   Colonoscopy 2014 Advance directive- daughter designated if patient were incapacitated.   Hypertension:    Using medication without problems or lightheadedness: yes Chest pain with exertion:no Edema:no Short of breath:no Lipids are reasonable.  D/w pt.   Diet and exercise are reasonable, d/w pt.    BPH s/p TURP.  Normal stream.  He has f/u with urology pending.  I'll defer PSA to urology, d/w pt.  He agrees.    H/o DVT.  On anticoagulation but the cost of med is really high, d/w pt- I asked for staff to help with this.  No ADE on med.  Compliant.    Facial pain resolved but numbness continues on the L side of the face.  He is tolerating as is.  His ability to taste is okay.    PMH and SH reviewed  ROS: Per HPI unless specifically indicated in ROS section   Meds, vitals, and allergies reviewed.   GEN: nad, alert and oriented, left facial numbness at baseline HEENT: mucous membranes moist NECK: supple w/o LA CV: rrr.  PULM: ctab, no inc wob ABD: soft, +bs EXT: no edema SKIN: no acute rash L facial numbness at baseline.

## 2017-01-11 NOTE — Patient Instructions (Addendum)
Check with your insurance to see if they will cover the shingrix shot. Don't change your meds for now.  Take care.  Glad to see you.  Update me as needed.

## 2017-01-12 ENCOUNTER — Other Ambulatory Visit: Payer: Self-pay | Admitting: Family Medicine

## 2017-01-12 DIAGNOSIS — Z7189 Other specified counseling: Secondary | ICD-10-CM | POA: Insufficient documentation

## 2017-01-12 DIAGNOSIS — Z0000 Encounter for general adult medical examination without abnormal findings: Secondary | ICD-10-CM | POA: Insufficient documentation

## 2017-01-12 MED ORDER — RIVAROXABAN 20 MG PO TABS: 20.0000 mg | ORAL_TABLET | Freq: Every evening | ORAL | 3 refills | Status: DC

## 2017-01-12 NOTE — Progress Notes (Signed)
rx printed.  Thanks.

## 2017-01-12 NOTE — Assessment & Plan Note (Signed)
BPH s/p TURP.  Normal stream.  He has f/u with urology pending.  I'll defer PSA to urology, d/w pt.  He agrees.

## 2017-01-12 NOTE — Assessment & Plan Note (Signed)
Advance directive- daughter designated if patient were incapacitated.   

## 2017-01-12 NOTE — Assessment & Plan Note (Signed)
On anticoagulation but the cost of med is really high, d/w pt- I asked for staff to help with this.  No ADE on med.  Compliant.   Would continue anticoagulation. Discussed with patient. He agrees.

## 2017-01-12 NOTE — Assessment & Plan Note (Signed)
With residual numbness. No change in situation.

## 2017-01-12 NOTE — Progress Notes (Signed)
Rx given to [facility] for pickup per Dr. Damita Dunnings.

## 2017-01-12 NOTE — Assessment & Plan Note (Signed)
Hearing screen d/w pt.  Declined hearing aid at this point.   shingrix d/w pt.  See AVS.   Prev intermittent smoking, <30 year pack hx, wouldn't qualify for lung CA screening program.   D/w pt.  He understood.   Colonoscopy 2014 Advance directive- daughter designated if patient were incapacitated.

## 2017-01-12 NOTE — Assessment & Plan Note (Signed)
Reasonable control. No change in meds. Lipids are reasonable. Labs discussed with patient.

## 2017-01-17 ENCOUNTER — Encounter: Payer: Self-pay | Admitting: Interventional Radiology

## 2017-03-04 ENCOUNTER — Other Ambulatory Visit: Payer: Self-pay | Admitting: *Deleted

## 2017-03-04 ENCOUNTER — Inpatient Hospital Stay (HOSPITAL_BASED_OUTPATIENT_CLINIC_OR_DEPARTMENT_OTHER): Payer: Medicare Other | Admitting: Hematology and Oncology

## 2017-03-04 ENCOUNTER — Inpatient Hospital Stay: Payer: Medicare Other | Attending: Hematology and Oncology

## 2017-03-04 ENCOUNTER — Other Ambulatory Visit: Payer: Self-pay

## 2017-03-04 VITALS — BP 113/76 | HR 51 | Temp 96.8°F | Resp 18 | Wt 189.0 lb

## 2017-03-04 DIAGNOSIS — D6851 Activated protein C resistance: Secondary | ICD-10-CM | POA: Diagnosis not present

## 2017-03-04 DIAGNOSIS — Z7901 Long term (current) use of anticoagulants: Secondary | ICD-10-CM | POA: Diagnosis not present

## 2017-03-04 DIAGNOSIS — Z85828 Personal history of other malignant neoplasm of skin: Secondary | ICD-10-CM | POA: Diagnosis not present

## 2017-03-04 DIAGNOSIS — Z7982 Long term (current) use of aspirin: Secondary | ICD-10-CM | POA: Insufficient documentation

## 2017-03-04 DIAGNOSIS — I10 Essential (primary) hypertension: Secondary | ICD-10-CM | POA: Insufficient documentation

## 2017-03-04 DIAGNOSIS — Z8042 Family history of malignant neoplasm of prostate: Secondary | ICD-10-CM | POA: Insufficient documentation

## 2017-03-04 DIAGNOSIS — Z806 Family history of leukemia: Secondary | ICD-10-CM

## 2017-03-04 DIAGNOSIS — D500 Iron deficiency anemia secondary to blood loss (chronic): Secondary | ICD-10-CM

## 2017-03-04 DIAGNOSIS — Z87891 Personal history of nicotine dependence: Secondary | ICD-10-CM

## 2017-03-04 DIAGNOSIS — I82512 Chronic embolism and thrombosis of left femoral vein: Secondary | ICD-10-CM

## 2017-03-04 DIAGNOSIS — I82422 Acute embolism and thrombosis of left iliac vein: Secondary | ICD-10-CM

## 2017-03-04 DIAGNOSIS — Z79899 Other long term (current) drug therapy: Secondary | ICD-10-CM | POA: Insufficient documentation

## 2017-03-04 DIAGNOSIS — I82502 Chronic embolism and thrombosis of unspecified deep veins of left lower extremity: Secondary | ICD-10-CM

## 2017-03-04 DIAGNOSIS — Z800 Family history of malignant neoplasm of digestive organs: Secondary | ICD-10-CM | POA: Insufficient documentation

## 2017-03-04 DIAGNOSIS — K219 Gastro-esophageal reflux disease without esophagitis: Secondary | ICD-10-CM | POA: Diagnosis not present

## 2017-03-04 DIAGNOSIS — I871 Compression of vein: Secondary | ICD-10-CM

## 2017-03-04 LAB — COMPREHENSIVE METABOLIC PANEL
ALT: 20 U/L (ref 17–63)
AST: 21 U/L (ref 15–41)
Albumin: 4.3 g/dL (ref 3.5–5.0)
Alkaline Phosphatase: 53 U/L (ref 38–126)
Anion gap: 8 (ref 5–15)
BUN: 16 mg/dL (ref 6–20)
CO2: 26 mmol/L (ref 22–32)
Calcium: 9.3 mg/dL (ref 8.9–10.3)
Chloride: 104 mmol/L (ref 101–111)
Creatinine, Ser: 0.9 mg/dL (ref 0.61–1.24)
GFR calc Af Amer: >60 mL/min (ref >60)
GFR calc non Af Amer: >60 mL/min (ref >60)
Glucose, Bld: 102 mg/dL — ABNORMAL HIGH (ref 65–99)
Potassium: 4.4 mmol/L (ref 3.5–5.1)
Sodium: 138 mmol/L (ref 135–145)
Total Bilirubin: 0.8 mg/dL (ref 0.3–1.2)
Total Protein: 6.8 g/dL (ref 6.5–8.1)

## 2017-03-04 LAB — CBC WITH DIFFERENTIAL/PLATELET
Basophils Absolute: 0 10*3/uL (ref 0–0.1)
Basophils Relative: 1 %
Eosinophils Absolute: 0.3 10*3/uL (ref 0–0.7)
Eosinophils Relative: 8 %
HCT: 44.7 % (ref 40.0–52.0)
Hemoglobin: 15 g/dL (ref 13.0–18.0)
Lymphocytes Relative: 28 %
Lymphs Abs: 1.2 10*3/uL (ref 1.0–3.6)
MCH: 29.1 pg (ref 26.0–34.0)
MCHC: 33.6 g/dL (ref 32.0–36.0)
MCV: 86.6 fL (ref 80.0–100.0)
Monocytes Absolute: 0.4 10*3/uL (ref 0.2–1.0)
Monocytes Relative: 9 %
Neutro Abs: 2.4 10*3/uL (ref 1.4–6.5)
Neutrophils Relative %: 54 %
Platelets: 185 10*3/uL (ref 150–440)
RBC: 5.16 MIL/uL (ref 4.40–5.90)
RDW: 13.1 % (ref 11.5–14.5)
WBC: 4.3 10*3/uL (ref 3.8–10.6)
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
## 2017-03-04 NOTE — Progress Notes (Signed)
Per pt no pain today but intermittent pain in L leg -entire leg -increased w activity -ie yard work.  In NAD today.

## 2017-03-21 ENCOUNTER — Encounter: Payer: Self-pay | Admitting: Hematology and Oncology

## 2017-05-02 DIAGNOSIS — H2513 Age-related nuclear cataract, bilateral: Secondary | ICD-10-CM | POA: Diagnosis not present

## 2017-05-02 DIAGNOSIS — H40013 Open angle with borderline findings, low risk, bilateral: Secondary | ICD-10-CM | POA: Diagnosis not present

## 2017-05-12 ENCOUNTER — Ambulatory Visit (HOSPITAL_COMMUNITY)
Admission: EM | Admit: 2017-05-12 | Discharge: 2017-05-12 | Disposition: A | Discharge status: Home / Self Care | Payer: Medicare Other | Attending: Family Medicine | Admitting: Family Medicine

## 2017-05-12 ENCOUNTER — Encounter (HOSPITAL_COMMUNITY): Payer: Self-pay | Admitting: Emergency Medicine

## 2017-05-12 ENCOUNTER — Other Ambulatory Visit: Payer: Self-pay

## 2017-05-12 DIAGNOSIS — R42 Dizziness and giddiness: Secondary | ICD-10-CM

## 2017-05-12 DIAGNOSIS — R270 Ataxia, unspecified: Secondary | ICD-10-CM

## 2017-05-12 NOTE — ED Triage Notes (Signed)
Pt states "I was sitting at home around 4 and I felt dizzy when I got up, and I went to sit back down, I feel so dizzy I cant walk." Pt has no other neuro deficits. Pt has difficulty walking.
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
## 2017-05-30 ENCOUNTER — Encounter: Payer: Self-pay | Admitting: Gastroenterology

## 2017-06-02 ENCOUNTER — Inpatient Hospital Stay: Payer: Medicare Other | Attending: Hematology and Oncology

## 2017-06-02 DIAGNOSIS — D6851 Activated protein C resistance: Secondary | ICD-10-CM | POA: Diagnosis not present

## 2017-06-02 DIAGNOSIS — Z7901 Long term (current) use of anticoagulants: Secondary | ICD-10-CM | POA: Diagnosis not present

## 2017-06-02 DIAGNOSIS — I82502 Chronic embolism and thrombosis of unspecified deep veins of left lower extremity: Secondary | ICD-10-CM

## 2017-06-02 DIAGNOSIS — I82512 Chronic embolism and thrombosis of left femoral vein: Secondary | ICD-10-CM | POA: Diagnosis present

## 2017-06-02 LAB — CBC WITH DIFFERENTIAL/PLATELET
Basophils Absolute: 0 10*3/uL (ref 0–0.1)
Basophils Relative: 1 %
Eosinophils Absolute: 0.3 10*3/uL (ref 0–0.7)
Eosinophils Relative: 9 %
HCT: 44.2 % (ref 40.0–52.0)
Hemoglobin: 15.1 g/dL (ref 13.0–18.0)
Lymphocytes Relative: 26 %
Lymphs Abs: 1 10*3/uL (ref 1.0–3.6)
MCH: 29.4 pg (ref 26.0–34.0)
MCHC: 34.2 g/dL (ref 32.0–36.0)
MCV: 85.8 fL (ref 80.0–100.0)
Monocytes Absolute: 0.4 10*3/uL (ref 0.2–1.0)
Monocytes Relative: 9 %
Neutro Abs: 2.2 10*3/uL (ref 1.4–6.5)
Neutrophils Relative %: 55 %
Platelets: 181 10*3/uL (ref 150–440)
RBC: 5.16 MIL/uL (ref 4.40–5.90)
RDW: 13.2 % (ref 11.5–14.5)
WBC: 3.9 10*3/uL (ref 3.8–10.6)

## 2017-06-02 LAB — COMPREHENSIVE METABOLIC PANEL
ALT: 23 U/L (ref 17–63)
AST: 23 U/L (ref 15–41)
Albumin: 4.3 g/dL (ref 3.5–5.0)
Alkaline Phosphatase: 62 U/L (ref 38–126)
Anion gap: 7 (ref 5–15)
BUN: 14 mg/dL (ref 6–20)
CO2: 27 mmol/L (ref 22–32)
Calcium: 9.2 mg/dL (ref 8.9–10.3)
Chloride: 107 mmol/L (ref 101–111)
Creatinine, Ser: 1.05 mg/dL (ref 0.61–1.24)
GFR calc Af Amer: >60 mL/min (ref >60)
GFR calc non Af Amer: >60 mL/min (ref >60)
Glucose, Bld: 87 mg/dL (ref 65–99)
Potassium: 4.1 mmol/L (ref 3.5–5.1)
Sodium: 141 mmol/L (ref 135–145)
Total Bilirubin: 0.7 mg/dL (ref 0.3–1.2)
Total Protein: 7 g/dL (ref 6.5–8.1)

## 2017-06-02 LAB — FERRITIN: Ferritin: 48 ng/mL (ref 24–336)

## 2017-06-08 DIAGNOSIS — H40033 Anatomical narrow angle, bilateral: Secondary | ICD-10-CM | POA: Diagnosis not present

## 2017-06-08 DIAGNOSIS — H02403 Unspecified ptosis of bilateral eyelids: Secondary | ICD-10-CM | POA: Diagnosis not present

## 2017-06-08 DIAGNOSIS — H43813 Vitreous degeneration, bilateral: Secondary | ICD-10-CM | POA: Diagnosis not present

## 2017-06-08 DIAGNOSIS — H40039 Anatomical narrow angle, unspecified eye: Secondary | ICD-10-CM | POA: Diagnosis not present

## 2017-06-08 DIAGNOSIS — H2513 Age-related nuclear cataract, bilateral: Secondary | ICD-10-CM | POA: Diagnosis not present

## 2017-06-08 DIAGNOSIS — H5203 Hypermetropia, bilateral: Secondary | ICD-10-CM | POA: Diagnosis not present

## 2017-06-13 ENCOUNTER — Other Ambulatory Visit: Payer: Self-pay | Admitting: Family Medicine

## 2017-06-20 DIAGNOSIS — N400 Enlarged prostate without lower urinary tract symptoms: Secondary | ICD-10-CM | POA: Diagnosis not present

## 2017-06-26 IMAGING — CR DG CHEST 2V
1 series · 3 of 3 positions shown · non-contrast
Comparison: PA and lateral chest x-ray December 06, 2014

CLINICAL DATA: Two weeks of persistent cough with abnormal chest
exam over the right lower lobe. No other symptoms. Former smoker.

EXAM:
CHEST  2 VIEW

[Series 1: dg chest 2 view · 0.14mm/px · 3 of 3 slices shown]
[im 1/3]
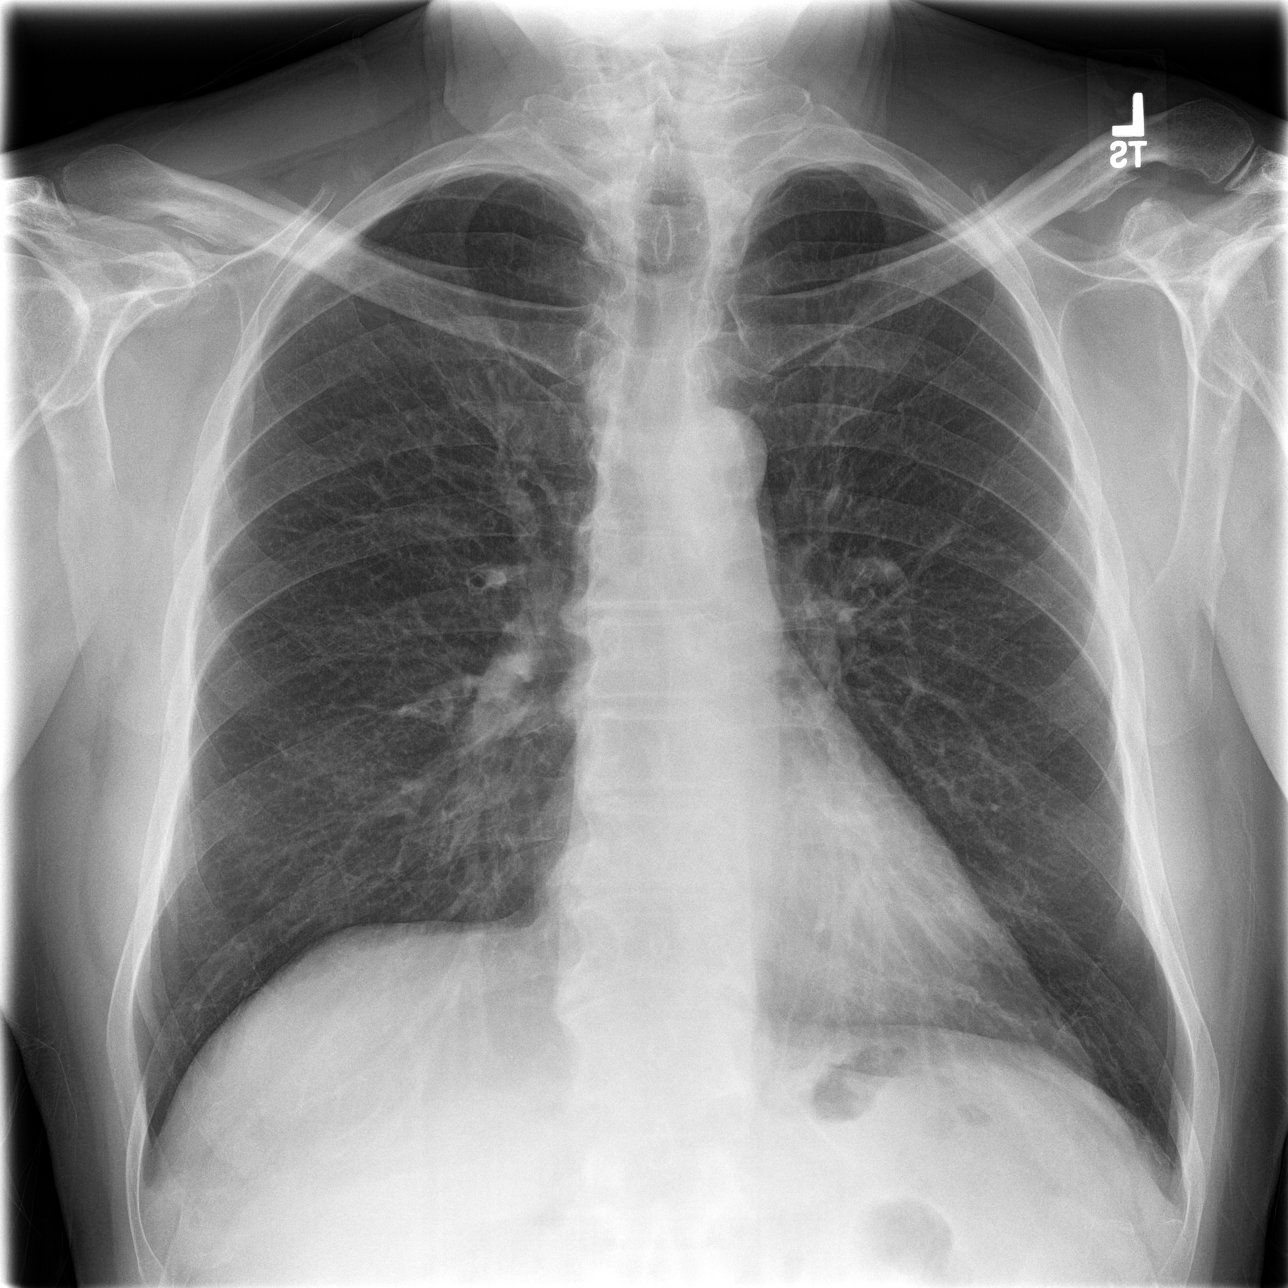
[im 2/3]
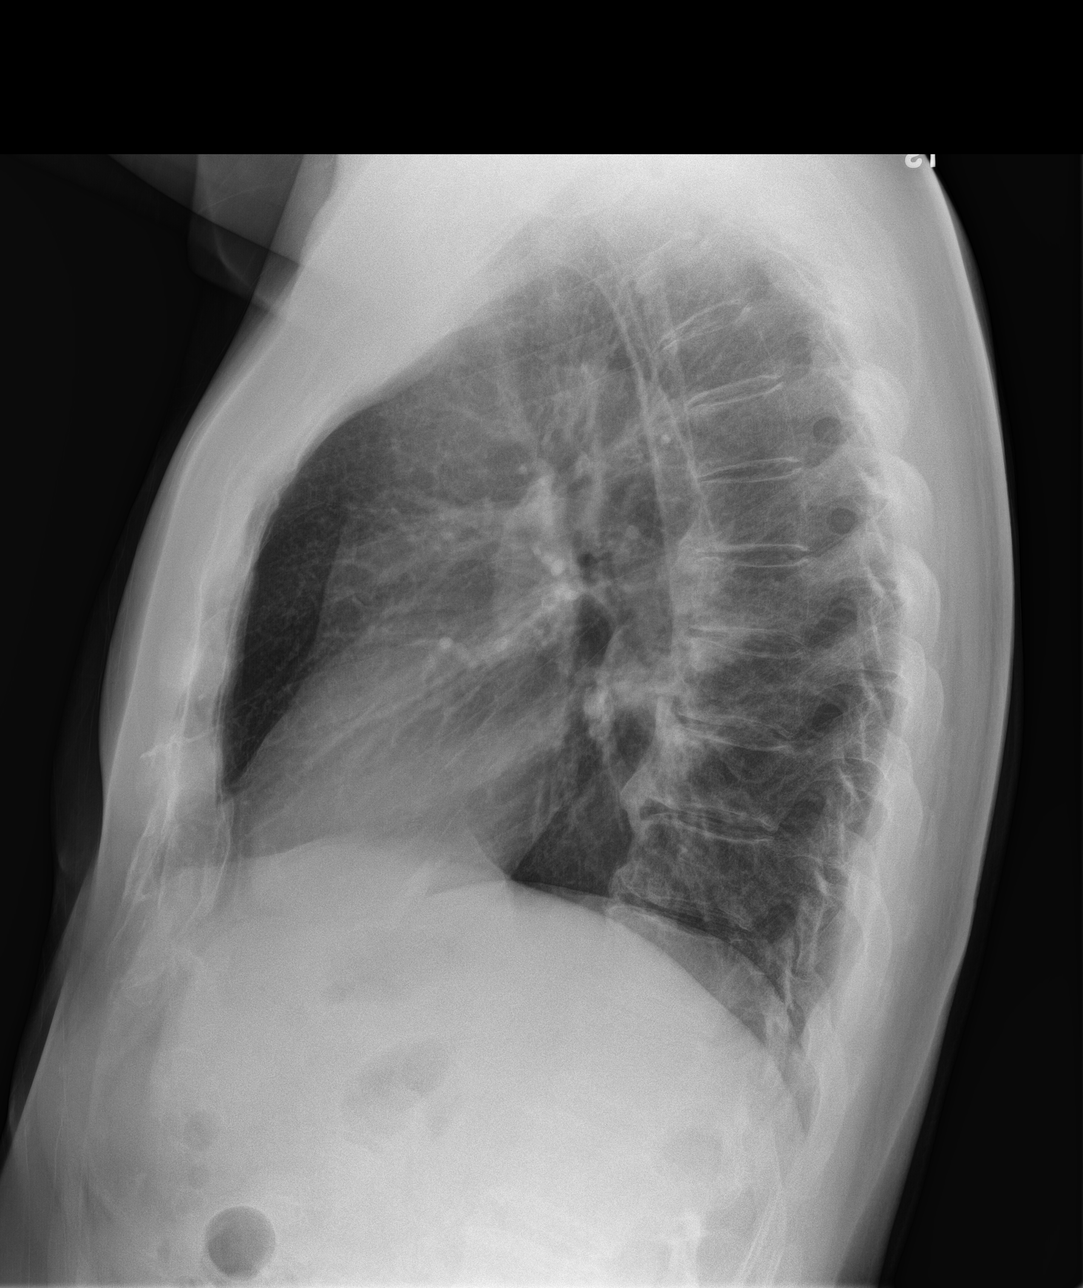
[im 3/3]
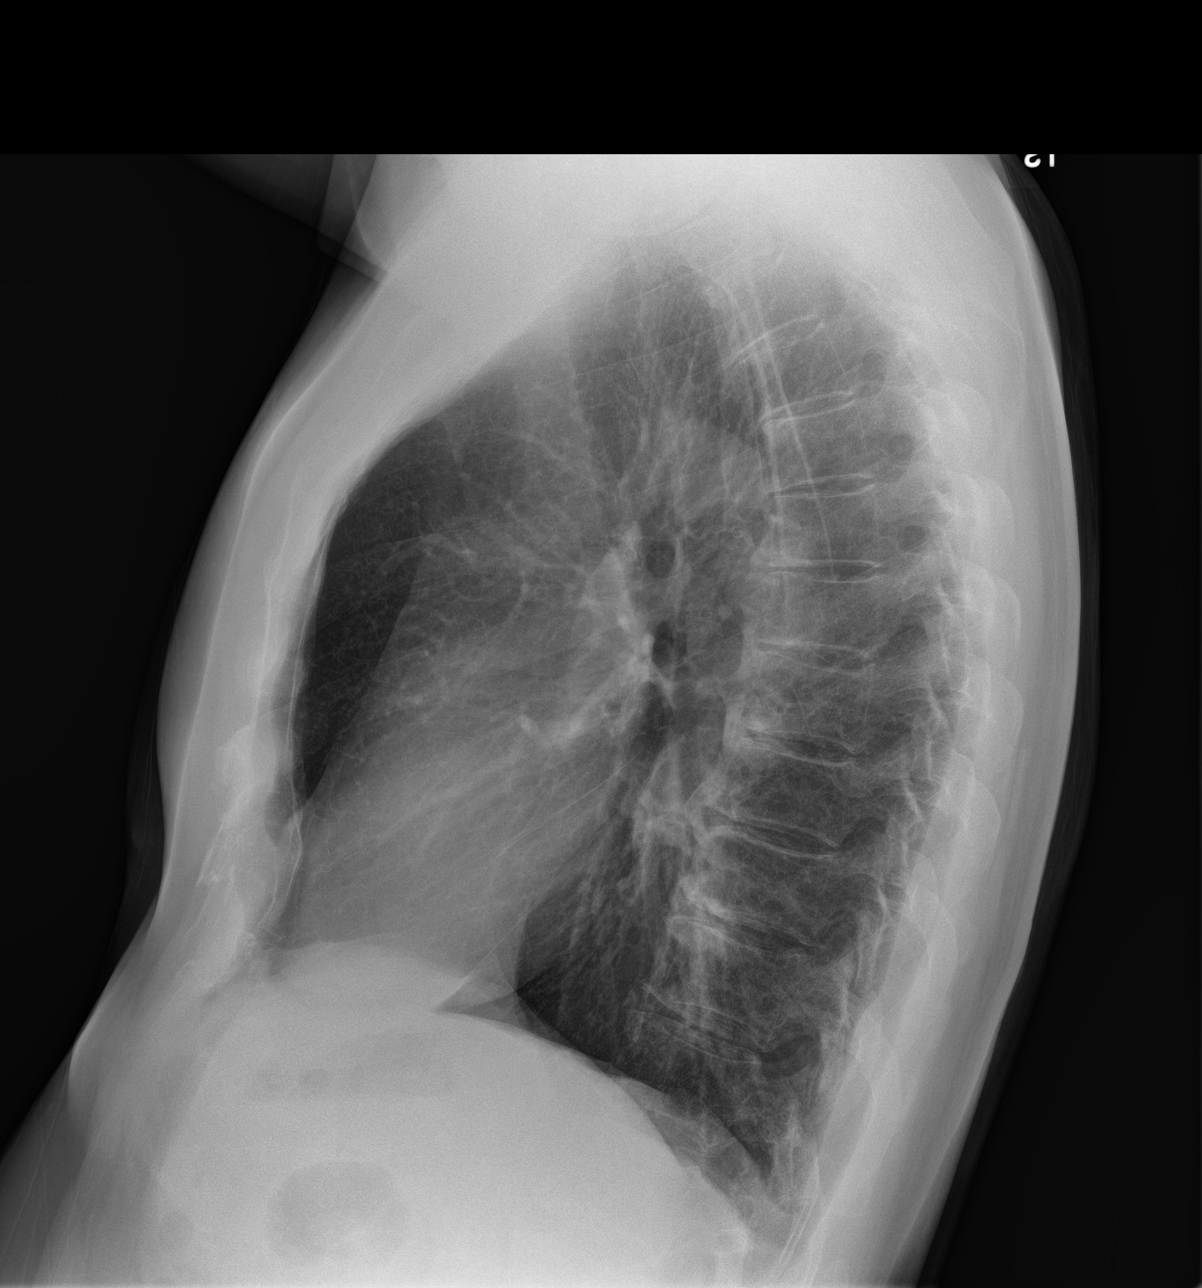

[3 of 3 positions shown; findings below may reference images not displayed]

FINDINGS: The lungs are well-expanded. There is no focal infiltrate. There is
no pleural effusion. The heart and pulmonary vascularity are normal.
The mediastinum is normal in width. The bony thorax exhibits no
acute abnormality. There is calcification of portions of the
anterior longitudinal ligament with bridging anterior osteophytes in
the mid and lower thoracic spine.
IMPRESSION: There is no pneumonia, CHF, nor other acute cardiopulmonary
abnormality.

## 2017-06-27 DIAGNOSIS — R350 Frequency of micturition: Secondary | ICD-10-CM | POA: Diagnosis not present

## 2017-06-27 DIAGNOSIS — R3914 Feeling of incomplete bladder emptying: Secondary | ICD-10-CM | POA: Diagnosis not present

## 2017-06-27 DIAGNOSIS — N401 Enlarged prostate with lower urinary tract symptoms: Secondary | ICD-10-CM | POA: Diagnosis not present

## 2017-08-08 DIAGNOSIS — H2513 Age-related nuclear cataract, bilateral: Secondary | ICD-10-CM | POA: Diagnosis not present

## 2017-08-08 DIAGNOSIS — H40013 Open angle with borderline findings, low risk, bilateral: Secondary | ICD-10-CM | POA: Diagnosis not present

## 2017-08-10 ENCOUNTER — Encounter: Payer: Self-pay | Admitting: Family Medicine

## 2017-08-10 ENCOUNTER — Ambulatory Visit: Payer: Medicare Other | Admitting: Family Medicine

## 2017-08-10 VITALS — BP 120/74 | HR 74 | Temp 98.6°F | Ht 71.75 in | Wt 192.0 lb

## 2017-08-10 DIAGNOSIS — J0100 Acute maxillary sinusitis, unspecified: Secondary | ICD-10-CM

## 2017-08-10 DIAGNOSIS — J019 Acute sinusitis, unspecified: Secondary | ICD-10-CM | POA: Insufficient documentation

## 2017-08-10 MED ORDER — AMOXICILLIN-POT CLAVULANATE 875-125 MG PO TABS: 1.0000 | ORAL_TABLET | Freq: Two times a day (BID) | ORAL | 0 refills | Status: DC
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
## 2017-08-10 NOTE — Patient Instructions (Addendum)
Drink fluids  You can try some nasal saline spray as needed  You can try flonase nasal spray also - daily 2-3 weeks   Stop the afrin   Take the augmentin as directed   Update if not starting to improve in a week or if worsening

## 2017-08-11 NOTE — Assessment & Plan Note (Signed)
On L side (the side he has permanent paresthesia in)  augmentin  Nasal saline flonase if tolerated Stop afrin due to poss of rebound  Disc symptomatic care - see instructions on AVS  Update if not starting to improve in a week or if worsening  .

## 2017-08-23 ENCOUNTER — Encounter: Payer: Self-pay | Admitting: Family Medicine

## 2017-08-23 ENCOUNTER — Ambulatory Visit: Payer: Medicare Other | Admitting: Family Medicine

## 2017-08-23 VITALS — BP 124/78 | HR 72 | Temp 97.8°F | Ht 71.75 in | Wt 192.8 lb

## 2017-08-23 DIAGNOSIS — J0100 Acute maxillary sinusitis, unspecified: Secondary | ICD-10-CM

## 2017-08-23 MED ORDER — AMOXICILLIN-POT CLAVULANATE 875-125 MG PO TABS
1.0000 | ORAL_TABLET | Freq: Two times a day (BID) | ORAL | 0 refills | Status: DC
Start: 2017-08-23 — End: 2017-09-05

## 2017-08-23 NOTE — Assessment & Plan Note (Signed)
Complex case with hx of trigeminal neuralgia /facial parethesia L  Mostly improved Still some yellow d/c and congestion Extend augmentin by 7d If not resolved-consider CT or ENT ref  Update if not starting to improve in a week or if worsening

## 2017-08-23 NOTE — Patient Instructions (Signed)
Glad you are starting to improve  Drink your fluids Use nasal saline 7 more days of augmentin   If not better after that please let us know
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
## 2017-08-30 ENCOUNTER — Inpatient Hospital Stay: Payer: Medicare Other

## 2017-08-30 ENCOUNTER — Inpatient Hospital Stay: Payer: Medicare Other | Admitting: Hematology and Oncology

## 2017-09-05 ENCOUNTER — Encounter: Payer: Self-pay | Admitting: Nurse Practitioner

## 2017-09-05 ENCOUNTER — Ambulatory Visit: Payer: Medicare Other | Admitting: Nurse Practitioner

## 2017-09-05 VITALS — BP 118/86 | HR 64 | Ht 72.0 in | Wt 193.2 lb

## 2017-09-05 DIAGNOSIS — Z7901 Long term (current) use of anticoagulants: Secondary | ICD-10-CM

## 2017-09-05 DIAGNOSIS — Z800 Family history of malignant neoplasm of digestive organs: Secondary | ICD-10-CM

## 2017-09-05 DIAGNOSIS — R1310 Dysphagia, unspecified: Secondary | ICD-10-CM | POA: Diagnosis not present

## 2017-09-05 MED ORDER — NA SULFATE-K SULFATE-MG SULF 17.5-3.13-1.6 GM/177ML PO SOLN: ORAL | 0 refills | Status: DC

## 2017-09-05 MED ORDER — RANITIDINE HCL 150 MG PO TABS: 150.0000 mg | ORAL_TABLET | ORAL | 2 refills | Status: DC
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
## 2017-09-05 NOTE — Patient Instructions (Signed)
If you are age 70 or older, your body mass index should be between 23-30. Your Body mass index is 26.2 kg/m. If this is out of the aforementioned range listed, please consider follow up with your Primary Care Provider.  If you are age 70 or younger, your body mass index should be between 19-25. Your Body mass index is 26.2 kg/m. If this is out of the aformentioned range listed, please consider follow up with your Primary Care Provider.   You have been scheduled for a colonoscopy. Please follow written instructions given to you at your visit today.  Please pick up your prep supplies at the pharmacy within the next 1-3 days. If you use inhalers (even only as needed), please bring them with you on the day of your procedure. Your physician has requested that you go to www.startemmi.com and enter the access code given to you at your visit today. This web site gives a general overview about your procedure. However, you should still follow specific instructions given to you by our office regarding your preparation for the procedure.  We have sent the following medications to your pharmacy for you to pick up at your convenience: [place] have been scheduled for a Barium Esophogram at [facility] (1st floor of the hospital) on 09/19/17 at 10:30 am. Please arrive 15 minutes prior to your appointment for registration. Make certain not to have anything to eat or drink 3 hours prior to your test. If you need to reschedule for any reason, please contact radiology at [phone] to do so. __________________________________________________________________ A barium swallow is an examination that concentrates on views of the esophagus. This tends to be a double contrast exam (barium and two liquids which, when combined, create a gas to distend the wall of the oesophagus) or single contrast (non-ionic iodine based). The study is usually tailored to your symptoms so a good history is essential.  Attention is paid during the study to the form, structure and configuration of the esophagus, looking for functional disorders (such as aspiration, dysphagia, achalasia, motility and reflux) EXAMINATION You may be asked to change into a gown, depending on the type of swallow being performed. A radiologist and radiographer will perform the procedure. The radiologist will advise you of the type of contrast selected for your procedure and direct you during the exam. You will be asked to stand, sit or lie in several different positions and to hold a small amount of fluid in your mouth before being asked to swallow while the imaging is performed .In some instances you may be asked to swallow barium coated marshmallows to assess the motility of a solid food bolus. The exam can be recorded as a digital or video fluoroscopy procedure. POST PROCEDURE It will take 1-2 days for the barium to pass through your system. To facilitate this, it is important, unless otherwise directed, to increase your fluids for the next 24-48hrs and to resume your normal diet.  This test typically takes about 30 minutes to perform. _____________________________________________________________________________You have been given GERD Literature.  Thank you for choosing me and [facility].   Tye Savoy, NP

## 2017-09-05 NOTE — Progress Notes (Signed)
Reviewed and agree with management plan.  Nakeisha Greenhouse T. Nana Hoselton, MD FACG 

## 2017-09-12 ENCOUNTER — Other Ambulatory Visit: Payer: Self-pay | Admitting: Family Medicine

## 2017-09-13 ENCOUNTER — Other Ambulatory Visit: Payer: Self-pay

## 2017-09-13 ENCOUNTER — Inpatient Hospital Stay: Payer: Medicare Other | Attending: Hematology and Oncology

## 2017-09-13 ENCOUNTER — Encounter: Payer: Self-pay | Admitting: Hematology and Oncology

## 2017-09-13 ENCOUNTER — Inpatient Hospital Stay (HOSPITAL_BASED_OUTPATIENT_CLINIC_OR_DEPARTMENT_OTHER): Payer: Medicare Other | Admitting: Hematology and Oncology

## 2017-09-13 VITALS — BP 126/70 | HR 50 | Temp 97.0°F | Resp 20 | Ht 72.0 in | Wt 194.4 lb

## 2017-09-13 DIAGNOSIS — I10 Essential (primary) hypertension: Secondary | ICD-10-CM | POA: Insufficient documentation

## 2017-09-13 DIAGNOSIS — Z85828 Personal history of other malignant neoplasm of skin: Secondary | ICD-10-CM

## 2017-09-13 DIAGNOSIS — Z87891 Personal history of nicotine dependence: Secondary | ICD-10-CM | POA: Diagnosis not present

## 2017-09-13 DIAGNOSIS — I82512 Chronic embolism and thrombosis of left femoral vein: Secondary | ICD-10-CM

## 2017-09-13 DIAGNOSIS — Z8042 Family history of malignant neoplasm of prostate: Secondary | ICD-10-CM

## 2017-09-13 DIAGNOSIS — Z806 Family history of leukemia: Secondary | ICD-10-CM | POA: Diagnosis not present

## 2017-09-13 DIAGNOSIS — Z79899 Other long term (current) drug therapy: Secondary | ICD-10-CM | POA: Diagnosis not present

## 2017-09-13 DIAGNOSIS — I871 Compression of vein: Secondary | ICD-10-CM

## 2017-09-13 DIAGNOSIS — D6851 Activated protein C resistance: Secondary | ICD-10-CM | POA: Diagnosis not present

## 2017-09-13 DIAGNOSIS — I82502 Chronic embolism and thrombosis of unspecified deep veins of left lower extremity: Secondary | ICD-10-CM

## 2017-09-13 DIAGNOSIS — K219 Gastro-esophageal reflux disease without esophagitis: Secondary | ICD-10-CM

## 2017-09-13 DIAGNOSIS — Z800 Family history of malignant neoplasm of digestive organs: Secondary | ICD-10-CM

## 2017-09-13 DIAGNOSIS — Z7901 Long term (current) use of anticoagulants: Secondary | ICD-10-CM | POA: Insufficient documentation

## 2017-09-13 DIAGNOSIS — Z7982 Long term (current) use of aspirin: Secondary | ICD-10-CM | POA: Diagnosis not present

## 2017-09-13 LAB — COMPREHENSIVE METABOLIC PANEL
ALT: 24 U/L (ref 17–63)
AST: 25 U/L (ref 15–41)
Albumin: 4.3 g/dL (ref 3.5–5.0)
Alkaline Phosphatase: 58 U/L (ref 38–126)
Anion gap: 6 (ref 5–15)
BUN: 12 mg/dL (ref 6–20)
CO2: 24 mmol/L (ref 22–32)
Calcium: 9.2 mg/dL (ref 8.9–10.3)
Chloride: 110 mmol/L (ref 101–111)
Creatinine, Ser: 1 mg/dL (ref 0.61–1.24)
GFR calc Af Amer: >60 mL/min (ref >60)
GFR calc non Af Amer: >60 mL/min (ref >60)
Glucose, Bld: 100 mg/dL — ABNORMAL HIGH (ref 65–99)
Potassium: 3.9 mmol/L (ref 3.5–5.1)
Sodium: 140 mmol/L (ref 135–145)
Total Bilirubin: 0.6 mg/dL (ref 0.3–1.2)
Total Protein: 6.6 g/dL (ref 6.5–8.1)

## 2017-09-13 LAB — CBC WITH DIFFERENTIAL/PLATELET
Basophils Absolute: 0 10*3/uL (ref 0–0.1)
Basophils Relative: 1 %
Eosinophils Absolute: 0.4 10*3/uL (ref 0–0.7)
Eosinophils Relative: 7 %
HCT: 42.5 % (ref 40.0–52.0)
Hemoglobin: 14.5 g/dL (ref 13.0–18.0)
Lymphocytes Relative: 24 %
Lymphs Abs: 1.2 10*3/uL (ref 1.0–3.6)
MCH: 29.6 pg (ref 26.0–34.0)
MCHC: 34.1 g/dL (ref 32.0–36.0)
MCV: 86.7 fL (ref 80.0–100.0)
Monocytes Absolute: 0.4 10*3/uL (ref 0.2–1.0)
Monocytes Relative: 9 %
Neutro Abs: 3 10*3/uL (ref 1.4–6.5)
Neutrophils Relative %: 59 %
Platelets: 162 10*3/uL (ref 150–440)
RBC: 4.9 MIL/uL (ref 4.40–5.90)
RDW: 13.5 % (ref 11.5–14.5)
WBC: 5 10*3/uL (ref 3.8–10.6)

## 2017-09-13 LAB — FERRITIN: Ferritin: 33 ng/mL (ref 24–336)
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
## 2017-09-19 ENCOUNTER — Ambulatory Visit (HOSPITAL_COMMUNITY)
Admission: RE | Admit: 2017-09-19 | Discharge: 2017-09-19 | Disposition: A | Discharge status: Home / Self Care | Payer: Medicare Other | Source: Ambulatory Visit | Attending: Nurse Practitioner | Admitting: Nurse Practitioner

## 2017-09-19 DIAGNOSIS — R1310 Dysphagia, unspecified: Secondary | ICD-10-CM | POA: Diagnosis not present

## 2017-09-19 DIAGNOSIS — K449 Diaphragmatic hernia without obstruction or gangrene: Secondary | ICD-10-CM | POA: Insufficient documentation

## 2017-09-22 ENCOUNTER — Telehealth: Payer: Self-pay

## 2017-09-22 NOTE — Telephone Encounter (Signed)
Patient advised of his imaging results.  He is asking about "sample of the prep." Did you call him?

## 2017-09-26 ENCOUNTER — Telehealth: Payer: Self-pay | Admitting: Nurse Practitioner

## 2017-09-26 NOTE — Telephone Encounter (Signed)
Patient to pick up suprep.  Do not need PA for insurance.

## 2017-09-29 ENCOUNTER — Telehealth: Payer: Self-pay | Admitting: Nurse Practitioner

## 2017-09-29 NOTE — Telephone Encounter (Signed)
Called patient back regarding a prep if he needs another colonoscopy with polypectomy.

## 2017-10-01 ENCOUNTER — Encounter: Payer: Self-pay | Admitting: Family Medicine

## 2017-10-01 LAB — IFOBT (OCCULT BLOOD): IMMUNOLOGICAL FECAL OCCULT BLOOD TEST: NEGATIVE

## 2017-10-05 DIAGNOSIS — Z0000 Encounter for general adult medical examination without abnormal findings: Secondary | ICD-10-CM | POA: Diagnosis not present

## 2017-10-12 LAB — COLOGUARD: Cologuard: NEGATIVE

## 2017-10-27 ENCOUNTER — Other Ambulatory Visit: Payer: Self-pay | Admitting: Interventional Radiology

## 2017-10-27 DIAGNOSIS — I871 Compression of vein: Secondary | ICD-10-CM

## 2017-10-28 ENCOUNTER — Encounter: Payer: Self-pay | Admitting: Family Medicine

## 2017-11-15 ENCOUNTER — Encounter: Payer: Self-pay | Admitting: *Deleted

## 2017-11-15 ENCOUNTER — Encounter: Payer: Self-pay | Admitting: Radiology

## 2017-11-15 ENCOUNTER — Ambulatory Visit: Payer: Medicare Other | Admitting: Family Medicine

## 2017-11-15 ENCOUNTER — Ambulatory Visit
Admission: RE | Admit: 2017-11-15 | Discharge: 2017-11-15 | Disposition: A | Discharge status: Home / Self Care | Payer: Medicare Other | Source: Ambulatory Visit | Attending: Interventional Radiology | Admitting: Interventional Radiology

## 2017-11-15 ENCOUNTER — Encounter: Payer: Self-pay | Admitting: Gastroenterology

## 2017-11-15 VITALS — BP 110/74 | HR 59 | Temp 98.2°F | Ht 71.75 in | Wt 191.5 lb

## 2017-11-15 DIAGNOSIS — H8110 Benign paroxysmal vertigo, unspecified ear: Secondary | ICD-10-CM | POA: Insufficient documentation

## 2017-11-15 DIAGNOSIS — I82522 Chronic embolism and thrombosis of left iliac vein: Secondary | ICD-10-CM | POA: Diagnosis not present

## 2017-11-15 DIAGNOSIS — I871 Compression of vein: Secondary | ICD-10-CM

## 2017-11-15 DIAGNOSIS — Z7982 Long term (current) use of aspirin: Secondary | ICD-10-CM | POA: Diagnosis not present

## 2017-11-15 DIAGNOSIS — Z9889 Other specified postprocedural states: Secondary | ICD-10-CM | POA: Diagnosis not present

## 2017-11-15 HISTORY — PX: IR RADIOLOGIST EVAL & MGMT: IMG5224

## 2017-11-15 MED ORDER — MECLIZINE HCL 12.5 MG PO TABS: 12.5000 mg | ORAL_TABLET | Freq: Three times a day (TID) | ORAL | 0 refills | Status: DC | PRN
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
## 2017-11-15 NOTE — Assessment & Plan Note (Signed)
Meclizine as need for symptoms. Start home desentization exercises. Info given.

## 2017-11-15 NOTE — Patient Instructions (Addendum)
Follow heart rate at home/ pharmacy.. HR goal 60-90.  Start home desensitization exercises 3-4 times daily.  Can use meclizine as needed for vertgio, but know it will make you tired. Call if not improving in 2 weeks for a referral balance retraining.  Benign Positional Vertigo Vertigo is the feeling that you or your surroundings are moving when they are not. Benign positional vertigo is the most common form of vertigo. The cause of this condition is not serious (is benign). This condition is triggered by certain movements and positions (is positional). This condition can be dangerous if it occurs while you are doing something that could endanger you or others, such as driving. What are the causes? In many cases, the cause of this condition is not known. It may be caused by a disturbance in an area of the inner ear that helps your brain to sense movement and balance. This disturbance can be caused by a viral infection (labyrinthitis), head injury, or repetitive motion. What increases the risk? This condition is more likely to develop in:  Women.  People who are 70 years of age or older.  What are the signs or symptoms? Symptoms of this condition usually happen when you move your head or your eyes in different directions. Symptoms may start suddenly, and they usually last for less than a minute. Symptoms may include:  Loss of balance and falling.  Feeling like you are spinning or moving.  Feeling like your surroundings are spinning or moving.  Nausea and vomiting.  Blurred vision.  Dizziness.  Involuntary eye movement (nystagmus).  Symptoms can be mild and cause only slight annoyance, or they can be severe and interfere with daily life. Episodes of benign positional vertigo may return (recur) over time, and they may be triggered by certain movements. Symptoms may improve over time. How is this diagnosed? This condition is usually diagnosed by medical history and a physical exam of  the head, neck, and ears. You may be referred to a health care provider who specializes in ear, nose, and throat (ENT) problems (otolaryngologist) or a provider who specializes in disorders of the nervous system (neurologist). You may have additional testing, including:  MRI.  A CT scan.  Eye movement tests. Your health care provider may ask you to change positions quickly while he or she watches you for symptoms of benign positional vertigo, such as nystagmus. Eye movement may be tested with an electronystagmogram (ENG), caloric stimulation, the Dix-Hallpike test, or the roll test.  An electroencephalogram (EEG). This records electrical activity in your brain.  Hearing tests.  How is this treated? Usually, your health care provider will treat this by moving your head in specific positions to adjust your inner ear back to normal. Surgery may be needed in severe cases, but this is rare. In some cases, benign positional vertigo may resolve on its own in 2-4 weeks. Follow these instructions at home: Safety  Move slowly.Avoid sudden body or head movements.  Avoid driving.  Avoid operating heavy machinery.  Avoid doing any tasks that would be dangerous to you or others if a vertigo episode would occur.  If you have trouble walking or keeping your balance, try using a cane for stability. If you feel dizzy or unstable, sit down right away.  Return to your normal activities as told by your health care provider. Ask your health care provider what activities are safe for you. General instructions  Take over-the-counter and prescription medicines only as told by your health care  provider.  Avoid certain positions or movements as told by your health care provider.  Drink enough fluid to keep your urine clear or pale yellow.  Keep all follow-up visits as told by your health care provider. This is important. Contact a health care provider if:  You have a fever.  Your condition gets worse  or you develop new symptoms.  Your family or friends notice any behavioral changes.  Your nausea or vomiting gets worse.  You have numbness or a "pins and needles" sensation. Get help right away if:  You have difficulty speaking or moving.  You are always dizzy.  You faint.  You develop severe headaches.  You have weakness in your legs or arms.  You have changes in your hearing or vision.  You develop a stiff neck.  You develop sensitivity to light. This information is not intended to replace advice given to you by your health care provider. Make sure you discuss any questions you have with your health care provider. Document Released: 12/28/2005 Document Revised: 08/28/2015 Document Reviewed: 07/15/2014 Elsevier Interactive Patient Education  [employer].

## 2017-11-15 NOTE — Progress Notes (Signed)
   Subjective:    Patient ID: [name], male    DOB: 05-19-1945, 72 y.o.   MRN: [id number]  HPI   72 year old male pt of Dr. Josefine Class with history of HTN, DVT/FactorV [place] on chronic anticoag presents with new onset  Dizziness.  Woke up with symptoms yesterday.  He reports when moving head forward and back, rolling over in bed.. Triggers vertigo.  feeling sleepy. Feeling off balance. No numbnses, no weakness, no slurred speech, no confusion.  No N/V.  No headache.  No URi symptoms.  He tried to use dramamine.. Did not help.   He has history of inner ear symptoms.   Social History /Family History/Past Medical History reviewed in detail and updated in EMR if needed. Blood pressure 110/74, pulse (!) 59, temperature 98.2 F (36.8 C), temperature source Oral, height 5' 11.75" (1.822 m), weight 191 lb 8 oz (86.9 kg).  Review of Systems  Constitutional: Negative for fatigue and fever.  HENT: Negative for ear pain.   Eyes: Negative for pain.  Respiratory: Negative for cough and shortness of breath.   Cardiovascular: Negative for chest pain, palpitations and leg swelling.  Gastrointestinal: Negative for abdominal pain.  Genitourinary: Negative for dysuria.  Musculoskeletal: Negative for arthralgias.  Neurological: Negative for syncope, light-headedness and headaches.  Psychiatric/Behavioral: Negative for dysphoric mood.       Objective:   Physical Exam  Constitutional: Vital signs are normal. He appears well-developed and well-nourished.  HENT:  Head: Normocephalic.  Right Ear: Hearing normal.  Left Ear: Hearing normal.  Nose: Nose normal.  Mouth/Throat: Oropharynx is clear and moist and mucous membranes are normal.  Neck: Trachea normal. Carotid bruit is not present. No thyroid mass and no thyromegaly present.  Cardiovascular: Normal rate, regular rhythm and normal pulses. Exam reveals no gallop, no distant heart sounds and no friction rub.  No murmur heard. No  peripheral edema  Pulmonary/Chest: Effort normal and breath sounds normal. No respiratory distress.  Neurological:   Triggered nystagmus with leaning forward.  Skin: Skin is warm, dry and intact. No rash noted.  Psychiatric: He has a normal mood and affect. His speech is normal and behavior is normal. Thought content normal.          Assessment & Plan:

## 2017-11-21 ENCOUNTER — Encounter: Payer: Self-pay | Admitting: Family Medicine

## 2017-11-21 ENCOUNTER — Ambulatory Visit: Payer: Medicare Other | Admitting: Family Medicine

## 2017-11-21 DIAGNOSIS — H8110 Benign paroxysmal vertigo, unspecified ear: Secondary | ICD-10-CM | POA: Diagnosis not present

## 2017-11-21 MED ORDER — MECLIZINE HCL 25 MG PO TABS: 25.0000 mg | ORAL_TABLET | Freq: Three times a day (TID) | ORAL | 2 refills | Status: DC | PRN

## 2017-11-21 NOTE — Patient Instructions (Addendum)
Try taking meclizine 25mg  every 8 hours.   Try the home exercises.  If not better then let me know and we'll set you up with the ENT clinic.  Update me as needed.   Take care.  Glad to see you.

## 2017-11-21 NOTE — Progress Notes (Signed)
Room spinning, not lightheaded. Brief but sig recurrent sx.  Rolling over in the bed makes it worse.  No CP.  He tried to use dramamine, it did not help.  Meclizine didn't help a lot initially at 12.5mg  per dose but then started 25mg  TID with some help.  He couldn't do home exercises yet.  No syncope.  He still has baseline numbness on the face.  He is some better with less severe vertigo recently.   He is checking to see what options are available re: colon cancer screening.  I'll defer.    Meds, vitals, and allergies reviewed.   ROS: Per HPI unless specifically indicated in ROS section   nad ncat Baseline facial numbness noted. OP wnl Normal neck ROM without nystagmus today.   No LA rrr ctab S/S grossly wnl x4

## 2017-11-23 NOTE — Assessment & Plan Note (Signed)
D/w pt to try taking meclizine 25mg  every 8 hours.   Try the home exercises as discussed. If not better then let me know and we'll set him up with the ENT clinic.  Update me as needed.  He agrees.

## 2017-11-28 ENCOUNTER — Encounter: Payer: Self-pay | Admitting: Gastroenterology

## 2017-11-28 ENCOUNTER — Ambulatory Visit (AMBULATORY_SURGERY_CENTER): Payer: Medicare Other | Admitting: Gastroenterology

## 2017-11-28 VITALS — BP 123/69 | HR 49 | Temp 97.5°F | Resp 11 | Ht 71.0 in | Wt 190.0 lb

## 2017-11-28 DIAGNOSIS — Z86010 Personal history of colonic polyps: Secondary | ICD-10-CM

## 2017-11-28 DIAGNOSIS — I10 Essential (primary) hypertension: Secondary | ICD-10-CM | POA: Diagnosis not present

## 2017-11-28 DIAGNOSIS — K219 Gastro-esophageal reflux disease without esophagitis: Secondary | ICD-10-CM | POA: Diagnosis not present

## 2017-11-28 DIAGNOSIS — Z800 Family history of malignant neoplasm of digestive organs: Secondary | ICD-10-CM | POA: Diagnosis not present

## 2017-11-28 DIAGNOSIS — R1310 Dysphagia, unspecified: Secondary | ICD-10-CM | POA: Diagnosis not present

## 2017-11-28 MED ORDER — SODIUM CHLORIDE 0.9 % IV SOLN: 500.0000 mL | Freq: Once | INTRAVENOUS | Status: DC

## 2017-11-28 NOTE — Op Note (Signed)
[facility] Patient Name: [name] Procedure Date: 11/28/2017 10:53 AM MRN: [id number] Endoscopist: Ladene Artist , MD Age: 72 Referring MD:  Date of Birth: 19-Jun-1945 Gender: Male Account #: [id number] Procedure:                Colonoscopy Indications:              Surveillance: Personal history of adenomatous                            polyps on last colonoscopy 5 years ago. Family                            history of colon cancer. Medicines:                Monitored Anesthesia Care Procedure:                Pre-Anesthesia Assessment:                           - Prior to the procedure, a History and Physical                            was performed, and patient medications and                            allergies were reviewed. The patient's tolerance of                            previous anesthesia was also reviewed. The risks                            and benefits of the procedure and the sedation                            options and risks were discussed with the patient.                            All questions were answered, and informed consent                            was obtained. Prior Anticoagulants: The patient has                            taken Xarelto (rivaroxaban), last dose was day of                            procedure. ASA Grade Assessment: III - A patient                            with severe systemic disease. After reviewing the                            risks and benefits, the patient was deemed in  satisfactory condition to undergo the procedure.                           After obtaining informed consent, the colonoscope                            was passed under direct vision. Throughout the                            procedure, the patient's blood pressure, pulse, and                            oxygen saturations were monitored continuously. The                            Colonoscope was introduced through  the anus and                            advanced to the the cecum, identified by                            appendiceal orifice and ileocecal valve. The                            ileocecal valve, appendiceal orifice, and rectum                            were photographed. The quality of the bowel                            preparation was adequate. The colonoscopy was                            performed without difficulty. The patient tolerated                            the procedure well. IC valve photo did not capture. Scope In: 10:59:03 AM Scope Out: 11:12:53 AM Scope Withdrawal Time: 0 hours 10 minutes 13 seconds  Total Procedure Duration: 0 hours 13 minutes 50 seconds  Findings:                 The perianal and digital rectal examinations were                            normal.                           Many small-mouthed diverticula were found in the                            left colon. There was no evidence of diverticular                            bleeding.  Internal hemorrhoids were found during                            retroflexion. The hemorrhoids were medium-sized and                            Grade I (internal hemorrhoids that do not prolapse).                           The exam was otherwise without abnormality on                            direct and retroflexion views. Complications:            No immediate complications. Estimated blood loss:                            None. Estimated Blood Loss:     Estimated blood loss: none. Impression:               - Mild diverticulosis in the left colon. There was                            no evidence of diverticular bleeding.                           - Internal hemorrhoids.                           - The examination was otherwise normal on direct                            and retroflexion views.                           - No specimens collected. Recommendation:           - Repeat  colonoscopy in 5 years for surveillance.                           - Resume Xarelto (rivaroxaban) today at prior dose.                            Refer to managing physician for further adjustment                            of therapy.                           - Patient has a contact number available for                            emergencies. The signs and symptoms of potential                            delayed complications were discussed with the  patient. Return to normal activities tomorrow.                            Written discharge instructions were provided to the                            patient.                           - High fiber diet.                           - Continue present medications. Ladene Artist, MD 11/28/2017 11:17:14 AM This report has been signed electronically.

## 2017-11-28 NOTE — Progress Notes (Signed)
History reviewed and updated.

## 2017-11-28 NOTE — Progress Notes (Signed)
Report to PACU, RN, vss, BBS= Clear.  

## 2017-11-28 NOTE — Patient Instructions (Signed)
Impression/Recommendations:  Diverticulosis handout given to patient. Hemorrhoid handout given to patient. High fiber diet handout given to patient.  Repeat colonoscopy in 5 years for surveillance.  Resume Xarelto (Rivaroxaban) today at prior dose.  Continue present medications.  YOU HAD AN ENDOSCOPIC PROCEDURE TODAY AT [facility]:   Refer to the procedure report that was given to you for any specific questions about what was found during the examination.  If the procedure report does not answer your questions, please call your gastroenterologist to clarify.  If you requested that your care partner not be given the details of your procedure findings, then the procedure report has been included in a sealed envelope for you to review at your convenience later.  YOU SHOULD EXPECT: Some feelings of bloating in the abdomen. Passage of more gas than usual.  Walking can help get rid of the air that was put into your GI tract during the procedure and reduce the bloating. If you had a lower endoscopy (such as a colonoscopy or flexible sigmoidoscopy) you may notice spotting of blood in your stool or on the toilet paper. If you underwent a bowel prep for your procedure, you may not have a normal bowel movement for a few days.  Please Note:  You might notice some irritation and congestion in your nose or some drainage.  This is from the oxygen used during your procedure.  There is no need for concern and it should clear up in a day or so.  SYMPTOMS TO REPORT IMMEDIATELY:   Following lower endoscopy (colonoscopy or flexible sigmoidoscopy):  Excessive amounts of blood in the stool  Significant tenderness or worsening of abdominal pains  Swelling of the abdomen that is new, acute  Fever of 100F or higher  For urgent or emergent issues, a gastroenterologist can be reached at any hour by calling [phone].   DIET:  We do recommend a small meal at first, but then you may proceed  to your regular diet.  Drink plenty of fluids but you should avoid alcoholic beverages for 24 hours.  ACTIVITY:  You should plan to take it easy for the rest of today and you should NOT DRIVE or use heavy machinery until tomorrow (because of the sedation medicines used during the test).    FOLLOW UP: Our staff will call the number listed on your records the next business day following your procedure to check on you and address any questions or concerns that you may have regarding the information given to you following your procedure. If we do not reach you, we will leave a message.  However, if you are feeling well and you are not experiencing any problems, there is no need to return our call.  We will assume that you have returned to your regular daily activities without incident.  If any biopsies were taken you will be contacted by phone or by letter within the next 1-3 weeks.  Please call us at [phone] if you have not heard about the biopsies in 3 weeks.    SIGNATURES/CONFIDENTIALITY: You and/or your care partner have signed paperwork which will be entered into your electronic medical record.  These signatures attest to the fact that that the information above on your After Visit Summary has been reviewed and is understood.  Full responsibility of the confidentiality of this discharge information lies with you and/or your care-partner.

## 2017-11-29 ENCOUNTER — Telehealth: Payer: Self-pay

## 2017-11-29 NOTE — Telephone Encounter (Signed)
  Follow up Call-  Call [name] number 11/28/2017  Post procedure Call [name] phone  # [phone]  Permission to leave phone message Yes  Some recent data might be hidden     Patient questions:  Do you have a fever, pain , or abdominal swelling? No. Pain Score  0 *  Have you tolerated food without any problems? Yes.    Have you been able to return to your normal activities? Yes.    Do you have any questions about your discharge instructions: Diet   No. Medications  No. Follow up visit  No.  Do you have questions or concerns about your Care? No.  Actions: * If pain score is 4 or above: No action needed, pain <4.

## 2017-12-16 DIAGNOSIS — Z85828 Personal history of other malignant neoplasm of skin: Secondary | ICD-10-CM | POA: Diagnosis not present

## 2017-12-16 DIAGNOSIS — L570 Actinic keratosis: Secondary | ICD-10-CM | POA: Diagnosis not present

## 2017-12-16 DIAGNOSIS — L821 Other seborrheic keratosis: Secondary | ICD-10-CM | POA: Diagnosis not present

## 2017-12-16 DIAGNOSIS — L720 Epidermal cyst: Secondary | ICD-10-CM | POA: Diagnosis not present

## 2017-12-26 DIAGNOSIS — H2513 Age-related nuclear cataract, bilateral: Secondary | ICD-10-CM | POA: Diagnosis not present

## 2017-12-26 DIAGNOSIS — H40013 Open angle with borderline findings, low risk, bilateral: Secondary | ICD-10-CM | POA: Diagnosis not present

## 2018-01-02 DIAGNOSIS — L570 Actinic keratosis: Secondary | ICD-10-CM | POA: Diagnosis not present

## 2018-01-16 ENCOUNTER — Ambulatory Visit (INDEPENDENT_AMBULATORY_CARE_PROVIDER_SITE_OTHER): Payer: Medicare Other

## 2018-01-16 ENCOUNTER — Other Ambulatory Visit: Payer: Self-pay | Admitting: Family Medicine

## 2018-01-16 ENCOUNTER — Encounter: Payer: Self-pay | Admitting: Family Medicine

## 2018-01-16 ENCOUNTER — Ambulatory Visit: Payer: Medicare Other

## 2018-01-16 VITALS — BP 128/84 | HR 58 | Temp 98.3°F | Ht 71.75 in | Wt 193.8 lb

## 2018-01-16 DIAGNOSIS — I10 Essential (primary) hypertension: Secondary | ICD-10-CM

## 2018-01-16 DIAGNOSIS — Z23 Encounter for immunization: Secondary | ICD-10-CM

## 2018-01-16 DIAGNOSIS — Z125 Encounter for screening for malignant neoplasm of prostate: Secondary | ICD-10-CM

## 2018-01-16 DIAGNOSIS — Z0000 Encounter for general adult medical examination without abnormal findings: Secondary | ICD-10-CM | POA: Diagnosis not present

## 2018-01-16 DIAGNOSIS — Z8042 Family history of malignant neoplasm of prostate: Secondary | ICD-10-CM | POA: Insufficient documentation

## 2018-01-16 LAB — LIPID PANEL
CHOL/HDL RATIO: 3
Cholesterol: 171 mg/dL (ref 0–200)
HDL: 53.3 mg/dL (ref >39.00)
LDL CALC: 101 mg/dL — AB (ref 0–99)
NonHDL: 117.56
TRIGLYCERIDES: 84 mg/dL (ref 0.0–149.0)
VLDL: 16.8 mg/dL (ref 0.0–40.0)

## 2018-01-16 LAB — CBC WITH DIFFERENTIAL/PLATELET
BASOS ABS: 0.1 10*3/uL (ref 0.0–0.1)
Basophils Relative: 1.1 % (ref 0.0–3.0)
EOS ABS: 0.6 10*3/uL (ref 0.0–0.7)
Eosinophils Relative: 11.1 % — ABNORMAL HIGH (ref 0.0–5.0)
HCT: 45.7 % (ref 39.0–52.0)
Hemoglobin: 15.3 g/dL (ref 13.0–17.0)
LYMPHS PCT: 25.4 % (ref 12.0–46.0)
Lymphs Abs: 1.3 10*3/uL (ref 0.7–4.0)
MCHC: 33.5 g/dL (ref 30.0–36.0)
MCV: 86.8 fl (ref 78.0–100.0)
MONO ABS: 0.5 10*3/uL (ref 0.1–1.0)
Monocytes Relative: 9 % (ref 3.0–12.0)
NEUTROS ABS: 2.8 10*3/uL (ref 1.4–7.7)
Neutrophils Relative %: 53.4 % (ref 43.0–77.0)
PLATELETS: 196 10*3/uL (ref 150.0–400.0)
RBC: 5.26 Mil/uL (ref 4.22–5.81)
RDW: 13.3 % (ref 11.5–15.5)
WBC: 5.2 10*3/uL (ref 4.0–10.5)

## 2018-01-16 LAB — COMPREHENSIVE METABOLIC PANEL
ALT: 26 U/L (ref 0–53)
AST: 16 U/L (ref 0–37)
Albumin: 4.3 g/dL (ref 3.5–5.2)
Alkaline Phosphatase: 61 U/L (ref 39–117)
BILIRUBIN TOTAL: 0.5 mg/dL (ref 0.2–1.2)
BUN: 15 mg/dL (ref 6–23)
CALCIUM: 9.7 mg/dL (ref 8.4–10.5)
CHLORIDE: 106 meq/L (ref 96–112)
CO2: 31 meq/L (ref 19–32)
Creatinine, Ser: 0.98 mg/dL (ref 0.40–1.50)
GFR: 80.21 mL/min (ref >60.00)
GLUCOSE: 99 mg/dL (ref 70–99)
Potassium: 4.8 mEq/L (ref 3.5–5.1)
SODIUM: 140 meq/L (ref 135–145)
Total Protein: 6.5 g/dL (ref 6.0–8.3)

## 2018-01-16 LAB — PSA, MEDICARE: PSA: 1.17 ng/ml (ref 0.10–4.00)

## 2018-01-16 NOTE — Progress Notes (Signed)
PCP notes:   Health maintenance:  Flu vaccine - administered  Abnormal screenings:   Hearing - failed  Hearing Screening   125Hz  250Hz  500Hz  1000Hz  2000Hz  3000Hz  4000Hz  6000Hz  8000Hz   Right ear:   40 40 40  0    Left ear:   40 0 0  0     Patient concerns:   None  Nurse concerns:  None  Next PCP appt:   01/17/18 @ 0845  I reviewed health advisor's note, was available for consultation on the day of service listed in this note, and agree with documentation and plan. Elsie Stain, MD.

## 2018-01-16 NOTE — Patient Instructions (Signed)
[name] , Thank you for taking time to come for your Medicare Wellness Visit. I appreciate your ongoing commitment to your health goals. Please review the following plan we discussed and let me know if I can assist you in the future.   These are the goals we discussed: Goals    . Increase physical activity     Starting 01/16/2018, I will continue to do yard work for 6-8 hours 3 days per week.        This is a list of the screening recommended for you and due dates:  Health Maintenance  Topic Date Due  . Colon Cancer Screening  11/29/2022  . Tetanus Vaccine  02/03/2025  . Flu Shot  Completed  .  Hepatitis C: One time screening is recommended by Center for Disease Control  (CDC) for  adults born from 18 through 1963.   Completed  . Pneumonia vaccines  Completed   Preventive Care for Adults  A healthy lifestyle and preventive care can promote health and wellness. Preventive health guidelines for adults include the following key practices.  . A routine yearly physical is a good way to check with your health care provider about your health and preventive screening. It is a chance to share any concerns and updates on your health and to receive a thorough exam.  . Visit your dentist for a routine exam and preventive care every 6 months. Brush your teeth twice a day and floss once a day. Good oral hygiene prevents tooth decay and gum disease.  . The frequency of eye exams is based on your age, health, family medical history, use  of contact lenses, and other factors. Follow your health care provider's recommendations for frequency of eye exams.  . Eat a healthy diet. Foods like vegetables, fruits, whole grains, low-fat dairy products, and lean protein foods contain the nutrients you need without too many calories. Decrease your intake of foods high in solid fats, added sugars, and salt. Eat the right amount of calories for you. Get information about a proper diet from your health care  provider, if necessary.  . Regular physical exercise is one of the most important things you can do for your health. Most adults should get at least 150 minutes of moderate-intensity exercise (any activity that increases your heart rate and causes you to sweat) each week. In addition, most adults need muscle-strengthening exercises on 2 or more days a week.  Silver Sneakers may be a benefit available to you. To determine eligibility, you may visit the website: www.silversneakers.com or contact program at 941-013-6867 Mon-Fri between 8AM-8PM.   . Maintain a healthy weight. The body mass index (BMI) is a screening tool to identify possible weight problems. It provides an estimate of body fat based on height and weight. Your health care provider can find your BMI and can help you achieve or maintain a healthy weight.   For adults 20 years and older: ? A BMI below 18.5 is considered underweight. ? A BMI of 18.5 to 24.9 is normal. ? A BMI of 25 to 29.9 is considered overweight. ? A BMI of 30 and above is considered obese.   . Maintain normal blood lipids and cholesterol levels by exercising and minimizing your intake of saturated fat. Eat a balanced diet with plenty of fruit and vegetables. Blood tests for lipids and cholesterol should begin at age 75 and be repeated every 5 years. If your lipid or cholesterol levels are high, you are over 50,  or you are at high risk for heart disease, you may need your cholesterol levels checked more frequently. Ongoing high lipid and cholesterol levels should be treated with medicines if diet and exercise are not working.  . If you smoke, find out from your health care provider how to quit. If you do not use tobacco, please do not start.  . If you choose to drink alcohol, please do not consume more than 2 drinks per day. One drink is considered to be 12 ounces (355 mL) of beer, 5 ounces (148 mL) of wine, or 1.5 ounces (44 mL) of liquor.  . If you are 29-79 years  old, ask your health care provider if you should take aspirin to prevent strokes.  . Use sunscreen. Apply sunscreen liberally and repeatedly throughout the day. You should seek shade when your shadow is shorter than you. Protect yourself by wearing long sleeves, pants, a wide-brimmed hat, and sunglasses year round, whenever you are outdoors.  . Once a month, do a whole body skin exam, using a mirror to look at the skin on your back. Tell your health care provider of new moles, moles that have irregular borders, moles that are larger than a pencil eraser, or moles that have changed in shape or color.
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
## 2018-01-17 ENCOUNTER — Ambulatory Visit (INDEPENDENT_AMBULATORY_CARE_PROVIDER_SITE_OTHER): Payer: Medicare Other | Admitting: Family Medicine

## 2018-01-17 ENCOUNTER — Encounter: Payer: Self-pay | Admitting: Family Medicine

## 2018-01-17 VITALS — BP 128/84 | HR 58 | Temp 98.3°F | Ht 71.75 in | Wt 193.8 lb

## 2018-01-17 DIAGNOSIS — G500 Trigeminal neuralgia: Secondary | ICD-10-CM

## 2018-01-17 DIAGNOSIS — N400 Enlarged prostate without lower urinary tract symptoms: Secondary | ICD-10-CM

## 2018-01-17 DIAGNOSIS — I10 Essential (primary) hypertension: Secondary | ICD-10-CM

## 2018-01-17 DIAGNOSIS — Z0000 Encounter for general adult medical examination without abnormal findings: Secondary | ICD-10-CM

## 2018-01-17 DIAGNOSIS — Z86718 Personal history of other venous thrombosis and embolism: Secondary | ICD-10-CM | POA: Diagnosis not present

## 2018-01-17 DIAGNOSIS — M722 Plantar fascial fibromatosis: Secondary | ICD-10-CM | POA: Diagnosis not present

## 2018-01-17 DIAGNOSIS — Z7189 Other specified counseling: Secondary | ICD-10-CM

## 2018-01-17 MED ORDER — TAMSULOSIN HCL 0.4 MG PO CAPS: 0.4000 mg | ORAL_CAPSULE | Freq: Every day | ORAL | 3 refills | Status: DC

## 2018-01-17 MED ORDER — RIVAROXABAN 20 MG PO TABS: 20.0000 mg | ORAL_TABLET | Freq: Every evening | ORAL | 3 refills | Status: DC

## 2018-01-17 NOTE — Patient Instructions (Addendum)
Use up your 40mg  lisinopril, taking 1/2 tab a day.  Update me when you need a refill.  Remind me and we can change to the 20mg  tabs so you won't have to cut them.  Use a pill cutter.   Gently stretch your feet prior to getting out of bed.  Use the hand out.  Update me as needed.   Take care.  Glad to see you.

## 2018-01-17 NOTE — Progress Notes (Signed)
Hearing screening failed, d/w pt.  Declined hearing aids.   shingrix d/w pt.  See AVS.   Flu shot prev done.   PNA up to date.   Tetanus 2016 Prev intermittent smoking, <30 year pack hx, wouldn't qualify for lung CA screening program.   D/w pt.  He understood.   Colonoscopy 2019 PSA wnl.   Advance directive- daughter [name] designated if patient were incapacitated.   H/o DVT.  No ADE on anticoagulation.  Compliant.  No bleeding.  H/o 2 DVTs.  H/o 5 Leiden heterzygous.    Some pain with first steps in the AM.  B but L>R foot.  No trauma.    Trigeminal neuralgia.  No pain but some numbness on the L side of face.  This is at baseline.  No weakness.    Hypertension:    Using medication without problems or lightheadedness: yes Chest pain with exertion:no Edema:no Short of breath:no Labs d/w pt.   LUTS controlled s/p TURP on flomax.  PSA wnl.  D/w pt.    He has routine derm f/u pending.   Meds, vitals, and allergies reviewed.   PMH and SH reviewed  ROS: Per HPI unless specifically indicated in ROS section   GEN: nad, alert and oriented L facial sensation altered at baseline with normal smile B HEENT: mucous membranes moist NECK: supple w/o LA CV: rrr. PULM: ctab, no inc wob ABD: soft, +bs EXT: no edema SKIN: no acute rash Foot exam with out pain on exam- not ttp on the arch, ankle, calcaneous.  No edema.  (he points to the plantar fascia origin as the site that is ttp in the early AM after first few steps).

## 2018-01-18 DIAGNOSIS — M722 Plantar fascial fibromatosis: Secondary | ICD-10-CM | POA: Insufficient documentation

## 2018-01-18 NOTE — Assessment & Plan Note (Addendum)
Labs discussed with patient.  Continue work on diet and exercise.  No change in meds.  He agrees. >25 minutes spent in face to face time with patient, >50% spent in counselling or coordination of care.

## 2018-01-18 NOTE — Assessment & Plan Note (Signed)
Handout given to patient and discussed.  Discussed stretching.  Update me as needed.

## 2018-01-18 NOTE — Assessment & Plan Note (Signed)
Advance directive- daughter Lynette designated if patient were incapacitated.  

## 2018-01-18 NOTE — Assessment & Plan Note (Signed)
LUTS controlled s/p TURP on flomax.  PSA wnl.  D/w pt.

## 2018-01-18 NOTE — Assessment & Plan Note (Signed)
No ADE on anticoagulation.  Compliant.  No bleeding.  H/o 2 DVTs.  H/o 5 Leiden heterzygous.   Reasonable to continue anticoagulation.  Benefit is likely much greater than risk.  Discussed with patient.  He agrees.

## 2018-01-18 NOTE — Assessment & Plan Note (Signed)
Hearing screening failed, d/w pt.  Declined hearing aids.   shingrix d/w pt.  See AVS.   Flu shot prev done.   PNA up to date.   Tetanus 2016 Prev intermittent smoking, <30 year pack hx, wouldn't qualify for lung CA screening program.   D/w pt.  He understood.   Colonoscopy 2019 PSA wnl.   Advance directive- daughter [name] designated if patient were incapacitated.

## 2018-01-18 NOTE — Assessment & Plan Note (Signed)
He still has altered sensation on the left side of face but he is not having the pain he was previously.  Update me as needed.

## 2018-01-30 DIAGNOSIS — L570 Actinic keratosis: Secondary | ICD-10-CM | POA: Diagnosis not present

## 2018-03-10 ENCOUNTER — Other Ambulatory Visit: Payer: Self-pay | Admitting: Urgent Care

## 2018-03-10 DIAGNOSIS — D6851 Activated protein C resistance: Secondary | ICD-10-CM

## 2018-03-10 DIAGNOSIS — D509 Iron deficiency anemia, unspecified: Secondary | ICD-10-CM

## 2018-03-14 ENCOUNTER — Inpatient Hospital Stay (HOSPITAL_BASED_OUTPATIENT_CLINIC_OR_DEPARTMENT_OTHER): Payer: Medicare Other | Admitting: Hematology and Oncology

## 2018-03-14 ENCOUNTER — Inpatient Hospital Stay: Payer: Medicare Other | Attending: Hematology and Oncology

## 2018-03-14 ENCOUNTER — Encounter: Payer: Self-pay | Admitting: Hematology and Oncology

## 2018-03-14 ENCOUNTER — Telehealth: Payer: Self-pay | Admitting: Pharmacist

## 2018-03-14 VITALS — BP 144/92 | HR 51 | Temp 97.6°F | Resp 18 | Wt 194.1 lb

## 2018-03-14 DIAGNOSIS — Z7982 Long term (current) use of aspirin: Secondary | ICD-10-CM

## 2018-03-14 DIAGNOSIS — Z87891 Personal history of nicotine dependence: Secondary | ICD-10-CM | POA: Insufficient documentation

## 2018-03-14 DIAGNOSIS — Z7901 Long term (current) use of anticoagulants: Secondary | ICD-10-CM

## 2018-03-14 DIAGNOSIS — Z79899 Other long term (current) drug therapy: Secondary | ICD-10-CM | POA: Insufficient documentation

## 2018-03-14 DIAGNOSIS — I10 Essential (primary) hypertension: Secondary | ICD-10-CM

## 2018-03-14 DIAGNOSIS — Z86718 Personal history of other venous thrombosis and embolism: Secondary | ICD-10-CM | POA: Diagnosis not present

## 2018-03-14 DIAGNOSIS — D649 Anemia, unspecified: Secondary | ICD-10-CM | POA: Insufficient documentation

## 2018-03-14 DIAGNOSIS — D6851 Activated protein C resistance: Secondary | ICD-10-CM | POA: Diagnosis not present

## 2018-03-14 DIAGNOSIS — D509 Iron deficiency anemia, unspecified: Secondary | ICD-10-CM

## 2018-03-14 DIAGNOSIS — I871 Compression of vein: Secondary | ICD-10-CM

## 2018-03-14 LAB — COMPREHENSIVE METABOLIC PANEL
ALBUMIN: 4.3 g/dL (ref 3.5–5.0)
ALK PHOS: 51 U/L (ref 38–126)
ALT: 38 U/L (ref 0–44)
ANION GAP: 3 — AB (ref 5–15)
AST: 30 U/L (ref 15–41)
BUN: 17 mg/dL (ref 8–23)
CALCIUM: 9.2 mg/dL (ref 8.9–10.3)
CO2: 29 mmol/L (ref 22–32)
Chloride: 108 mmol/L (ref 98–111)
Creatinine, Ser: 1.09 mg/dL (ref 0.61–1.24)
GFR calc non Af Amer: >60 mL/min (ref >60)
Glucose, Bld: 98 mg/dL (ref 70–99)
Potassium: 4.6 mmol/L (ref 3.5–5.1)
Sodium: 140 mmol/L (ref 135–145)
Total Bilirubin: 0.8 mg/dL (ref 0.3–1.2)
Total Protein: 6.5 g/dL (ref 6.5–8.1)

## 2018-03-14 LAB — CBC WITH DIFFERENTIAL/PLATELET
Abs Immature Granulocytes: 0.01 10*3/uL (ref 0.00–0.07)
BASOS ABS: 0.1 10*3/uL (ref 0.0–0.1)
BASOS PCT: 1 %
EOS ABS: 0.5 10*3/uL (ref 0.0–0.5)
Eosinophils Relative: 11 %
HCT: 43.9 % (ref 39.0–52.0)
Hemoglobin: 14.5 g/dL (ref 13.0–17.0)
IMMATURE GRANULOCYTES: 0 %
Lymphocytes Relative: 26 %
Lymphs Abs: 1.1 10*3/uL (ref 0.7–4.0)
MCH: 28.8 pg (ref 26.0–34.0)
MCHC: 33 g/dL (ref 30.0–36.0)
MCV: 87.3 fL (ref 80.0–100.0)
Monocytes Absolute: 0.4 10*3/uL (ref 0.1–1.0)
Monocytes Relative: 10 %
NEUTROS PCT: 52 %
NRBC: 0 % (ref 0.0–0.2)
Neutro Abs: 2.2 10*3/uL (ref 1.7–7.7)
PLATELETS: 182 10*3/uL (ref 150–400)
RBC: 5.03 MIL/uL (ref 4.22–5.81)
RDW: 12.3 % (ref 11.5–15.5)
WBC: 4.3 10*3/uL (ref 4.0–10.5)

## 2018-03-14 LAB — FERRITIN: Ferritin: 46 ng/mL (ref 24–336)

## 2018-03-14 NOTE — Progress Notes (Signed)
Patient offers no complaints. 

## 2018-03-14 NOTE — Telephone Encounter (Signed)
Oral Chemotherapy Pharmacist Encounter  Dispensed samples to patient:  Medication: Xarelto 20mg  tablets Instructions: Take 1 tablet (20 mg total) by mouth every evening. Quantity dispensed: 21 tablets Days supply: 21 days Manufacturer: [employer] Lot: [id number] Exp: 09/2019  Darl Pikes, PharmD, BCPS, [facility] Hematology/Oncology Clinical Pharmacist [facility] [phone]  03/14/2018 11:29 AM
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
## 2018-03-16 ENCOUNTER — Other Ambulatory Visit: Payer: Medicare Other

## 2018-03-16 ENCOUNTER — Ambulatory Visit: Payer: Medicare Other | Admitting: Hematology and Oncology

## 2018-03-27 DIAGNOSIS — H0015 Chalazion left lower eyelid: Secondary | ICD-10-CM | POA: Diagnosis not present

## 2018-03-27 DIAGNOSIS — H40013 Open angle with borderline findings, low risk, bilateral: Secondary | ICD-10-CM | POA: Diagnosis not present

## 2018-03-27 DIAGNOSIS — H401121 Primary open-angle glaucoma, left eye, mild stage: Secondary | ICD-10-CM | POA: Diagnosis not present

## 2018-03-27 DIAGNOSIS — H2513 Age-related nuclear cataract, bilateral: Secondary | ICD-10-CM | POA: Diagnosis not present

## 2018-04-06 DIAGNOSIS — H0015 Chalazion left lower eyelid: Secondary | ICD-10-CM | POA: Diagnosis not present

## 2018-04-06 DIAGNOSIS — H2513 Age-related nuclear cataract, bilateral: Secondary | ICD-10-CM | POA: Diagnosis not present

## 2018-04-06 DIAGNOSIS — H40011 Open angle with borderline findings, low risk, right eye: Secondary | ICD-10-CM | POA: Diagnosis not present

## 2018-04-06 DIAGNOSIS — H401121 Primary open-angle glaucoma, left eye, mild stage: Secondary | ICD-10-CM | POA: Diagnosis not present

## 2018-04-17 DIAGNOSIS — Z0120 Encounter for dental examination and cleaning without abnormal findings: Secondary | ICD-10-CM | POA: Diagnosis not present

## 2018-04-27 DIAGNOSIS — Z85828 Personal history of other malignant neoplasm of skin: Secondary | ICD-10-CM | POA: Diagnosis not present

## 2018-04-27 DIAGNOSIS — L570 Actinic keratosis: Secondary | ICD-10-CM | POA: Diagnosis not present

## 2018-05-18 ENCOUNTER — Encounter: Payer: Self-pay | Admitting: Family Medicine

## 2018-05-18 ENCOUNTER — Ambulatory Visit (INDEPENDENT_AMBULATORY_CARE_PROVIDER_SITE_OTHER): Payer: Medicare Other | Admitting: Family Medicine

## 2018-05-18 VITALS — BP 128/68 | HR 72 | Temp 99.2°F | Ht 71.75 in | Wt 197.2 lb

## 2018-05-18 DIAGNOSIS — J029 Acute pharyngitis, unspecified: Secondary | ICD-10-CM | POA: Diagnosis not present

## 2018-05-18 DIAGNOSIS — J020 Streptococcal pharyngitis: Secondary | ICD-10-CM | POA: Diagnosis not present

## 2018-05-18 LAB — POCT RAPID STREP A (OFFICE): Rapid Strep A Screen: POSITIVE — AB

## 2018-05-18 MED ORDER — AMOXICILLIN 500 MG PO CAPS: 500.0000 mg | ORAL_CAPSULE | Freq: Three times a day (TID) | ORAL | 0 refills | Status: DC
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
## 2018-05-18 NOTE — Assessment & Plan Note (Signed)
With ST and fever for almost a day  tx with amoxicillin  Handout given  Tylenol or ibuprofen for pain/fever/ST sympt care disc Fluids/rest  Contagiousness discussed  Update if not starting to improve in a week or if worsening

## 2018-05-18 NOTE — Patient Instructions (Signed)
Drink lots of fluids  Take the amoxicillin as directed   Salt water gargles  Tylenol or ibuprofen for aches/fever/and sore throat   Chloraseptic throat pain products are helpful  Update if not starting to improve in a week or if worsening

## 2018-06-20 DIAGNOSIS — N401 Enlarged prostate with lower urinary tract symptoms: Secondary | ICD-10-CM | POA: Diagnosis not present

## 2018-06-20 LAB — PSA: PSA: 0.95

## 2018-06-26 DIAGNOSIS — N312 Flaccid neuropathic bladder, not elsewhere classified: Secondary | ICD-10-CM | POA: Diagnosis not present

## 2018-06-26 DIAGNOSIS — N401 Enlarged prostate with lower urinary tract symptoms: Secondary | ICD-10-CM | POA: Diagnosis not present

## 2018-06-26 DIAGNOSIS — R3914 Feeling of incomplete bladder emptying: Secondary | ICD-10-CM | POA: Diagnosis not present

## 2018-07-10 DIAGNOSIS — R3914 Feeling of incomplete bladder emptying: Secondary | ICD-10-CM | POA: Diagnosis not present

## 2018-07-24 DIAGNOSIS — N323 Diverticulum of bladder: Secondary | ICD-10-CM | POA: Diagnosis not present

## 2018-07-24 DIAGNOSIS — N401 Enlarged prostate with lower urinary tract symptoms: Secondary | ICD-10-CM | POA: Diagnosis not present

## 2018-07-24 DIAGNOSIS — R3914 Feeling of incomplete bladder emptying: Secondary | ICD-10-CM | POA: Diagnosis not present

## 2018-09-05 ENCOUNTER — Emergency Department (HOSPITAL_COMMUNITY)
Admission: EM | Admit: 2018-09-05 | Discharge: 2018-09-06 | Disposition: A | Discharge status: Home / Self Care | Payer: Medicare Other | Attending: Emergency Medicine | Admitting: Emergency Medicine

## 2018-09-05 ENCOUNTER — Other Ambulatory Visit: Payer: Self-pay

## 2018-09-05 ENCOUNTER — Encounter (HOSPITAL_COMMUNITY): Payer: Self-pay | Admitting: *Deleted

## 2018-09-05 ENCOUNTER — Emergency Department (HOSPITAL_COMMUNITY): Payer: Medicare Other

## 2018-09-05 DIAGNOSIS — Z7901 Long term (current) use of anticoagulants: Secondary | ICD-10-CM | POA: Diagnosis not present

## 2018-09-05 DIAGNOSIS — M60241 Foreign body granuloma of soft tissue, not elsewhere classified, right hand: Secondary | ICD-10-CM | POA: Diagnosis not present

## 2018-09-05 DIAGNOSIS — Z87891 Personal history of nicotine dependence: Secondary | ICD-10-CM | POA: Diagnosis not present

## 2018-09-05 DIAGNOSIS — Z1889 Other specified retained foreign body fragments: Secondary | ICD-10-CM | POA: Diagnosis not present

## 2018-09-05 DIAGNOSIS — S60450A Superficial foreign body of right index finger, initial encounter: Secondary | ICD-10-CM | POA: Diagnosis not present

## 2018-09-05 DIAGNOSIS — Z79899 Other long term (current) drug therapy: Secondary | ICD-10-CM | POA: Insufficient documentation

## 2018-09-05 DIAGNOSIS — Z7982 Long term (current) use of aspirin: Secondary | ICD-10-CM | POA: Insufficient documentation

## 2018-09-05 DIAGNOSIS — S6991XA Unspecified injury of right wrist, hand and finger(s), initial encounter: Secondary | ICD-10-CM | POA: Diagnosis not present

## 2018-09-05 DIAGNOSIS — S60459A Superficial foreign body of unspecified finger, initial encounter: Secondary | ICD-10-CM

## 2018-09-05 NOTE — ED Triage Notes (Signed)
Pt has part of fishing hook caught in right index finger. Tetanus current.

## 2018-09-06 DIAGNOSIS — M79644 Pain in right finger(s): Secondary | ICD-10-CM | POA: Diagnosis not present

## 2018-09-06 DIAGNOSIS — S60450A Superficial foreign body of right index finger, initial encounter: Secondary | ICD-10-CM | POA: Diagnosis not present

## 2018-09-06 MED ORDER — CEPHALEXIN 500 MG PO CAPS: 500.0000 mg | ORAL_CAPSULE | Freq: Three times a day (TID) | ORAL | 0 refills | Status: DC

## 2018-09-06 MED ORDER — CEPHALEXIN 250 MG PO CAPS
500.0000 mg | ORAL_CAPSULE | Freq: Once | ORAL | Status: AC
  Administered 2018-09-06: 500 mg via ORAL
  Filled 2018-09-06: qty 2

## 2018-09-06 NOTE — Discharge Instructions (Addendum)
See Dr. Amedeo Plenty today at noon. The office will contact you if he needs you there earlier.
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
## 2018-09-06 NOTE — ED Notes (Signed)
Patient verbalizes understanding of discharge instructions. Opportunity for questioning and answers were provided. Armband removed by staff, pt discharged from ED.  

## 2018-09-07 DIAGNOSIS — Z4789 Encounter for other orthopedic aftercare: Secondary | ICD-10-CM | POA: Diagnosis not present

## 2018-09-07 DIAGNOSIS — M79644 Pain in right finger(s): Secondary | ICD-10-CM | POA: Diagnosis not present

## 2018-09-07 DIAGNOSIS — S60450D Superficial foreign body of right index finger, subsequent encounter: Secondary | ICD-10-CM | POA: Diagnosis not present

## 2018-09-08 DIAGNOSIS — M79644 Pain in right finger(s): Secondary | ICD-10-CM | POA: Diagnosis not present

## 2018-09-12 ENCOUNTER — Other Ambulatory Visit: Payer: Medicare Other

## 2018-09-12 ENCOUNTER — Ambulatory Visit: Payer: Medicare Other | Admitting: Hematology and Oncology

## 2018-09-29 ENCOUNTER — Ambulatory Visit: Payer: Self-pay | Admitting: *Deleted

## 2018-09-29 NOTE — Telephone Encounter (Signed)
Message from Denver Faster sent at 09/29/2018 4:49 PM EDT  Summary: cold like    Pt called in and stated that he is a little congested, no fever to shortness of breath, no vomiting or diarrhea. His Temp is 98.7. She stated he has a little congestion, thorat is a tiny bit sore. This has been going on for 2 day and not gotten worse, staying the same . Please advise  Best number - [phone]

## 2018-10-02 NOTE — Telephone Encounter (Signed)
Spoke to patient by telephone and was advised that he is feeling better. Patient stated that he is not running a fever and has been taking mucinex and thinks that has helped. Patient declined a virtual visit stating that if he does not continue to improve he will call back and schedule an appointment.

## 2018-10-02 NOTE — Telephone Encounter (Signed)
Left message on voicemail for patient to call back. 

## 2018-10-02 NOTE — Telephone Encounter (Signed)
Noted. Thanks.
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
## 2018-10-09 DIAGNOSIS — H401121 Primary open-angle glaucoma, left eye, mild stage: Secondary | ICD-10-CM | POA: Diagnosis not present

## 2018-10-09 DIAGNOSIS — H40011 Open angle with borderline findings, low risk, right eye: Secondary | ICD-10-CM | POA: Diagnosis not present

## 2018-10-09 DIAGNOSIS — H2513 Age-related nuclear cataract, bilateral: Secondary | ICD-10-CM | POA: Diagnosis not present

## 2018-10-10 ENCOUNTER — Other Ambulatory Visit: Payer: Self-pay

## 2018-10-10 ENCOUNTER — Inpatient Hospital Stay (HOSPITAL_BASED_OUTPATIENT_CLINIC_OR_DEPARTMENT_OTHER): Payer: Medicare Other | Admitting: Hematology and Oncology

## 2018-10-10 ENCOUNTER — Telehealth: Payer: Self-pay

## 2018-10-10 ENCOUNTER — Encounter: Payer: Self-pay | Admitting: Family Medicine

## 2018-10-10 ENCOUNTER — Inpatient Hospital Stay: Payer: Medicare Other | Attending: Hematology and Oncology

## 2018-10-10 ENCOUNTER — Ambulatory Visit (INDEPENDENT_AMBULATORY_CARE_PROVIDER_SITE_OTHER): Payer: Medicare Other | Admitting: Family Medicine

## 2018-10-10 ENCOUNTER — Encounter: Payer: Self-pay | Admitting: Hematology and Oncology

## 2018-10-10 VITALS — BP 131/78 | HR 47 | Temp 97.5°F | Resp 16 | Wt 195.8 lb

## 2018-10-10 VITALS — BP 124/80 | HR 62 | Temp 98.2°F | Ht 71.75 in | Wt 195.4 lb

## 2018-10-10 DIAGNOSIS — R001 Bradycardia, unspecified: Secondary | ICD-10-CM

## 2018-10-10 DIAGNOSIS — K219 Gastro-esophageal reflux disease without esophagitis: Secondary | ICD-10-CM | POA: Diagnosis not present

## 2018-10-10 DIAGNOSIS — H409 Unspecified glaucoma: Secondary | ICD-10-CM | POA: Diagnosis not present

## 2018-10-10 DIAGNOSIS — I825Y2 Chronic embolism and thrombosis of unspecified deep veins of left proximal lower extremity: Secondary | ICD-10-CM | POA: Insufficient documentation

## 2018-10-10 DIAGNOSIS — Z85828 Personal history of other malignant neoplasm of skin: Secondary | ICD-10-CM | POA: Insufficient documentation

## 2018-10-10 DIAGNOSIS — Z87891 Personal history of nicotine dependence: Secondary | ICD-10-CM

## 2018-10-10 DIAGNOSIS — Z7901 Long term (current) use of anticoagulants: Secondary | ICD-10-CM | POA: Diagnosis not present

## 2018-10-10 DIAGNOSIS — I871 Compression of vein: Secondary | ICD-10-CM

## 2018-10-10 DIAGNOSIS — R634 Abnormal weight loss: Secondary | ICD-10-CM

## 2018-10-10 DIAGNOSIS — Z86718 Personal history of other venous thrombosis and embolism: Secondary | ICD-10-CM | POA: Diagnosis not present

## 2018-10-10 DIAGNOSIS — Z8042 Family history of malignant neoplasm of prostate: Secondary | ICD-10-CM | POA: Insufficient documentation

## 2018-10-10 DIAGNOSIS — D6851 Activated protein C resistance: Secondary | ICD-10-CM | POA: Insufficient documentation

## 2018-10-10 DIAGNOSIS — Z7982 Long term (current) use of aspirin: Secondary | ICD-10-CM

## 2018-10-10 DIAGNOSIS — Z79899 Other long term (current) drug therapy: Secondary | ICD-10-CM | POA: Diagnosis not present

## 2018-10-10 DIAGNOSIS — Z800 Family history of malignant neoplasm of digestive organs: Secondary | ICD-10-CM

## 2018-10-10 DIAGNOSIS — I8392 Asymptomatic varicose veins of left lower extremity: Secondary | ICD-10-CM | POA: Diagnosis not present

## 2018-10-10 DIAGNOSIS — I10 Essential (primary) hypertension: Secondary | ICD-10-CM | POA: Insufficient documentation

## 2018-10-10 LAB — CBC WITH DIFFERENTIAL/PLATELET
Abs Immature Granulocytes: 0.05 10*3/uL (ref 0.00–0.07)
Basophils Absolute: 0.1 10*3/uL (ref 0.0–0.1)
Basophils Relative: 1 %
Eosinophils Absolute: 0.5 10*3/uL (ref 0.0–0.5)
Eosinophils Relative: 9 %
HCT: 47.3 % (ref 39.0–52.0)
Hemoglobin: 15.6 g/dL (ref 13.0–17.0)
Immature Granulocytes: 1 %
Lymphocytes Relative: 22 %
Lymphs Abs: 1.2 10*3/uL (ref 0.7–4.0)
MCH: 28.8 pg (ref 26.0–34.0)
MCHC: 33 g/dL (ref 30.0–36.0)
MCV: 87.4 fL (ref 80.0–100.0)
Monocytes Absolute: 0.4 10*3/uL (ref 0.1–1.0)
Monocytes Relative: 8 %
Neutro Abs: 3.1 10*3/uL (ref 1.7–7.7)
Neutrophils Relative %: 59 %
Platelets: 205 10*3/uL (ref 150–400)
RBC: 5.41 MIL/uL (ref 4.22–5.81)
RDW: 12.3 % (ref 11.5–15.5)
WBC: 5.3 10*3/uL (ref 4.0–10.5)
nRBC: 0 % (ref 0.0–0.2)

## 2018-10-10 LAB — COMPREHENSIVE METABOLIC PANEL
ALT: 24 U/L (ref 0–44)
AST: 20 U/L (ref 15–41)
Albumin: 4.2 g/dL (ref 3.5–5.0)
Alkaline Phosphatase: 62 U/L (ref 38–126)
Anion gap: 5 (ref 5–15)
BUN: 16 mg/dL (ref 8–23)
CO2: 26 mmol/L (ref 22–32)
Calcium: 9.1 mg/dL (ref 8.9–10.3)
Chloride: 106 mmol/L (ref 98–111)
Creatinine, Ser: 1.02 mg/dL (ref 0.61–1.24)
GFR calc Af Amer: >60 mL/min (ref >60)
GFR calc non Af Amer: >60 mL/min (ref >60)
Glucose, Bld: 109 mg/dL — ABNORMAL HIGH (ref 70–99)
Potassium: 4.9 mmol/L (ref 3.5–5.1)
Sodium: 137 mmol/L (ref 135–145)
Total Bilirubin: 0.5 mg/dL (ref 0.3–1.2)
Total Protein: 7 g/dL (ref 6.5–8.1)

## 2018-10-10 LAB — FERRITIN: Ferritin: 48 ng/mL (ref 24–336)

## 2018-10-10 NOTE — Progress Notes (Signed)
This visit was conducted in person.  BP 124/80 (BP Location: Left Arm, Patient Position: Sitting, Cuff Size: Normal)   Pulse 62   Temp 98.2 F (36.8 C) (Temporal)   Ht 5' 11.75" (1.822 m)   Wt 195 lb 6 oz (88.6 kg)   SpO2 98%   BMI 26.68 kg/m   Pulse Readings from Last 3 Encounters:  10/10/18 62  10/10/18 (!) 47  09/06/18 (!) 52    CC: bradycardia Subjective:    Patient ID: [name], male    DOB: 11-04-1945, 73 y.o.   MRN: [id number]  HPI: [name] is a 73 y.o. male presenting on 10/10/2018 for Bradycardia (Was seen at oncology today and told HR is really low. Suggested he see PCP today. 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  Found to have low heart rate to 47. Reviewing chart, has had several low HR to 50s latest 52 during ER eval for foreign body in R finger.   Has been feeling well. Denies dizziness, dyspnea, chest pain, headache, fatigue. No heart history.  Normally does drink 1 cup of coffee in am + 12 oz soft drink during day.   H/o glaucoma ([place]) on timolol 0.5% 1 drop BID - yesterday timolol was added to right eye (previously only left).      Relevant past medical, surgical, family and social history reviewed and updated as indicated. Interim medical history since our last visit reviewed. Allergies and medications reviewed and updated. Outpatient Medications Prior to Visit  Medication Sig Dispense Refill  . aspirin EC 81 MG tablet Take 81 mg by mouth daily.     Marland Kitchen lisinopril (PRINIVIL,ZESTRIL) 40 MG tablet Take 0.5 tablets (20 mg total) by mouth daily.    . Omega-3 Fatty Acids (FISH OIL) 1000 MG CPDR Take 1,000 mg by mouth daily.    . psyllium (METAMUCIL) 58.6 % packet Take 1 packet by mouth daily.    . rivaroxaban (XARELTO) 20 MG TABS tablet Take 1 tablet (20 mg total) by mouth every evening. 90 tablet 3  . tamsulosin (FLOMAX) 0.4 MG CAPS capsule Take 1 capsule (0.4 mg total) by mouth daily. 90 capsule 3  . timolol (TIMOPTIC)  0.5 % ophthalmic solution Place 1 drop into both eyes 2 (two) times daily.   2   No facility-administered medications prior to visit.      Per HPI unless specifically indicated in ROS section below Review of Systems Objective:    BP 124/80 (BP Location: Left Arm, Patient Position: Sitting, Cuff Size: Normal)   Pulse 62   Temp 98.2 F (36.8 C) (Temporal)   Ht 5' 11.75" (1.822 m)   Wt 195 lb 6 oz (88.6 kg)   SpO2 98%   BMI 26.68 kg/m   Wt Readings from Last 3 Encounters:  10/10/18 195 lb 6 oz (88.6 kg)  10/10/18 195 lb 12.3 oz (88.8 kg)  05/18/18 197 lb 3 oz (89.4 kg)    Physical Exam Vitals signs and nursing note reviewed.  Constitutional:      General: He is not in acute distress.    Appearance: Normal appearance. He is not ill-appearing.  HENT:     Head: Normocephalic and atraumatic.     Mouth/Throat:     Mouth: Mucous membranes are moist.     Pharynx: No posterior oropharyngeal erythema.  Eyes:     Extraocular Movements: Extraocular movements intact.     Pupils: Pupils are equal, round,  and reactive to light.  Neck:     Vascular: No carotid bruit.  Cardiovascular:     Rate and Rhythm: Normal rate and regular rhythm.     Pulses: Normal pulses.     Heart sounds: Normal heart sounds. No murmur.  Pulmonary:     Effort: Pulmonary effort is normal. No respiratory distress.     Breath sounds: Normal breath sounds. No wheezing, rhonchi or rales.  Musculoskeletal:     Right lower leg: No edema.     Left lower leg: No edema.  Neurological:     Mental Status: He is alert.  Psychiatric:        Mood and Affect: Mood normal.        Behavior: Behavior normal.       Results for orders placed or performed in visit on 10/10/18  Comprehensive metabolic panel  Result Value Ref Range   Sodium 137 135 - 145 mmol/L   Potassium 4.9 3.5 - 5.1 mmol/L   Chloride 106 98 - 111 mmol/L   CO2 26 22 - 32 mmol/L   Glucose, Bld 109 (H) 70 - 99 mg/dL   BUN 16 8 - 23 mg/dL   Creatinine,  Ser 1.02 0.61 - 1.24 mg/dL   Calcium 9.1 8.9 - 10.3 mg/dL   Total Protein 7.0 6.5 - 8.1 g/dL   Albumin 4.2 3.5 - 5.0 g/dL   AST 20 15 - 41 U/L   ALT 24 0 - 44 U/L   Alkaline Phosphatase 62 38 - 126 U/L   Total Bilirubin 0.5 0.3 - 1.2 mg/dL   GFR calc non Af Amer >60 >60 mL/min   GFR calc Af Amer >60 >60 mL/min   Anion gap 5 5 - 15  CBC with Differential  Result Value Ref Range   WBC 5.3 4.0 - 10.5 K/uL   RBC 5.41 4.22 - 5.81 MIL/uL   Hemoglobin 15.6 13.0 - 17.0 g/dL   HCT 47.3 39.0 - 52.0 %   MCV 87.4 80.0 - 100.0 fL   MCH 28.8 26.0 - 34.0 pg   MCHC 33.0 30.0 - 36.0 g/dL   RDW 12.3 11.5 - 15.5 %   Platelets 205 150 - 400 K/uL   nRBC 0.0 0.0 - 0.2 %   Neutrophils Relative % 59 %   Neutro Abs 3.1 1.7 - 7.7 K/uL   Lymphocytes Relative 22 %   Lymphs Abs 1.2 0.7 - 4.0 K/uL   Monocytes Relative 8 %   Monocytes Absolute 0.4 0.1 - 1.0 K/uL   Eosinophils Relative 9 %   Eosinophils Absolute 0.5 0.0 - 0.5 K/uL   Basophils Relative 1 %   Basophils Absolute 0.1 0.0 - 0.1 K/uL   Immature Granulocytes 1 %   Abs Immature Granulocytes 0.05 0.00 - 0.07 K/uL   EKG - sinus bradycardia 50s, normal axis, intervals, no acute ST/T changes.  Assessment & Plan:   Problem List Items Addressed This Visit    Glaucoma    Endorses glaucoma followed by Dr Katy Fitch      Bradycardia - Primary    Anticipate combination of healthy heart and timolol eye drops (yesterday increased use to both eyes). Reassurance provided. EKG today. Red flags to seek further care reviewed including new symptomatic bradycardia or worsening.        Relevant Orders   EKG 12-Lead (Completed)       No orders of the defined types were placed in this encounter.  Orders Placed This Encounter  Procedures  . EKG 12-Lead    Follow up plan: Return if symptoms worsen or fail to improve.  Ria Bush, MD

## 2018-10-10 NOTE — Assessment & Plan Note (Signed)
Anticipate combination of healthy heart and timolol eye drops (yesterday increased use to both eyes). Reassurance provided. EKG today. Red flags to seek further care reviewed including new symptomatic bradycardia or worsening.

## 2018-10-10 NOTE — Telephone Encounter (Signed)
Nurse with Dr. Mike Gip at the [facility] called this am while seeing patient. Patient experiencing episode of bradycardia in office (hr 47).   He is asymptomatic otherwise and has been evaluated by their physician who recommends he see his PCP for follow up today if possible.   Made office aware that Dr. Damita Dunnings is on vacation this week; however, Dr. Danise Mina does have an opening this afternoon at 2pm to follow up with patient on this.  Patient agrees and will come in at 155 (wearing mask, has neg covid screen) for 2pm appointment.   FYI to Dr. Darnell Level.

## 2018-10-10 NOTE — Assessment & Plan Note (Signed)
Endorses glaucoma followed by Dr Katy Fitch

## 2018-10-10 NOTE — Progress Notes (Signed)
Pt here for follow up. Denies any concerns.  

## 2018-10-10 NOTE — Patient Instructions (Signed)
EKG today I think low heart rate is due to increased timolol eye drop dose. Should be ok as long as feeling well. Try to time timolol after breakfast.  Touch base with Dr Katy Fitch about low heart rate.

## 2018-10-10 NOTE — Telephone Encounter (Signed)
Contacted Dr. Josefine Class office regarding Bradycardia during visit today. Advised of patient HR of 47. Spoke to [facility], Buyer, retail and appt. Was made for today at 1400. Advised patient and made him aware that if he starts developing symptoms (chest pain, SOB, dizziness, etc.) to go to ER. Patient verbalizes understanding and denies any further questions.

## 2018-10-30 DIAGNOSIS — H2513 Age-related nuclear cataract, bilateral: Secondary | ICD-10-CM | POA: Diagnosis not present

## 2018-10-30 DIAGNOSIS — H40011 Open angle with borderline findings, low risk, right eye: Secondary | ICD-10-CM | POA: Diagnosis not present

## 2018-10-30 DIAGNOSIS — H401121 Primary open-angle glaucoma, left eye, mild stage: Secondary | ICD-10-CM | POA: Diagnosis not present

## 2018-11-17 ENCOUNTER — Other Ambulatory Visit: Payer: Self-pay | Admitting: Interventional Radiology

## 2018-11-17 DIAGNOSIS — I871 Compression of vein: Secondary | ICD-10-CM

## 2018-11-29 ENCOUNTER — Encounter: Payer: Self-pay | Admitting: *Deleted

## 2018-11-29 ENCOUNTER — Ambulatory Visit
Admission: RE | Admit: 2018-11-29 | Discharge: 2018-11-29 | Disposition: A | Discharge status: Home / Self Care | Payer: Medicare Other | Source: Ambulatory Visit | Attending: Interventional Radiology | Admitting: Interventional Radiology

## 2018-11-29 ENCOUNTER — Other Ambulatory Visit: Payer: Self-pay

## 2018-11-29 DIAGNOSIS — Z86718 Personal history of other venous thrombosis and embolism: Secondary | ICD-10-CM | POA: Diagnosis not present

## 2018-11-29 DIAGNOSIS — I871 Compression of vein: Secondary | ICD-10-CM

## 2018-11-29 HISTORY — PX: IR RADIOLOGIST EVAL & MGMT: IMG5224
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
## 2018-12-12 DIAGNOSIS — H2513 Age-related nuclear cataract, bilateral: Secondary | ICD-10-CM | POA: Diagnosis not present

## 2018-12-12 DIAGNOSIS — H40011 Open angle with borderline findings, low risk, right eye: Secondary | ICD-10-CM | POA: Diagnosis not present

## 2018-12-12 DIAGNOSIS — H0015 Chalazion left lower eyelid: Secondary | ICD-10-CM | POA: Diagnosis not present

## 2018-12-12 DIAGNOSIS — H401121 Primary open-angle glaucoma, left eye, mild stage: Secondary | ICD-10-CM | POA: Diagnosis not present

## 2018-12-29 ENCOUNTER — Other Ambulatory Visit: Payer: Self-pay | Admitting: Family Medicine

## 2018-12-29 NOTE — Telephone Encounter (Signed)
Electronic refill request. Xarelto Last office visit:   10/10/2018 Acute Dr. Darnell Level Last Filled:    90 tablet 3 01/17/2018  Upcome CPE scheduled in November 2020

## 2018-12-31 NOTE — Telephone Encounter (Signed)
Sent. Thanks.   

## 2019-01-29 ENCOUNTER — Other Ambulatory Visit (INDEPENDENT_AMBULATORY_CARE_PROVIDER_SITE_OTHER): Payer: Medicare Other

## 2019-01-29 ENCOUNTER — Other Ambulatory Visit: Payer: Self-pay

## 2019-01-29 ENCOUNTER — Ambulatory Visit: Payer: Medicare Other

## 2019-01-29 ENCOUNTER — Other Ambulatory Visit: Payer: Self-pay | Admitting: Family Medicine

## 2019-01-29 ENCOUNTER — Ambulatory Visit (INDEPENDENT_AMBULATORY_CARE_PROVIDER_SITE_OTHER): Payer: Medicare Other

## 2019-01-29 DIAGNOSIS — I10 Essential (primary) hypertension: Secondary | ICD-10-CM

## 2019-01-29 DIAGNOSIS — Z125 Encounter for screening for malignant neoplasm of prostate: Secondary | ICD-10-CM | POA: Diagnosis not present

## 2019-01-29 DIAGNOSIS — Z0000 Encounter for general adult medical examination without abnormal findings: Secondary | ICD-10-CM

## 2019-01-29 LAB — LIPID PANEL
Cholesterol: 175 mg/dL (ref 0–200)
HDL: 55.7 mg/dL (ref >39.00)
LDL Cholesterol: 103 mg/dL — ABNORMAL HIGH (ref 0–99)
NonHDL: 119.26
Total CHOL/HDL Ratio: 3
Triglycerides: 82 mg/dL (ref 0.0–149.0)
VLDL: 16.4 mg/dL (ref 0.0–40.0)

## 2019-01-29 LAB — PSA, MEDICARE: PSA: 0.95 ng/ml (ref 0.10–4.00)

## 2019-01-29 NOTE — Patient Instructions (Signed)
[name] , Thank you for taking time to come for your Medicare Wellness Visit. I appreciate your ongoing commitment to your health goals. Please review the following plan we discussed and let me know if I can assist you in the future.   Screening recommendations/referrals: Colonoscopy: up to date, completed 11/28/2017 Recommended yearly ophthalmology/optometry visit for glaucoma screening and checkup Recommended yearly dental visit for hygiene and checkup  Vaccinations: Influenza vaccine: will get at next office visit  Pneumococcal vaccine: Completed series Tdap vaccine: up to date, completed 02/04/2015 Shingles vaccine:will check with pharmacy    Advanced directives: Advance directive discussed with you today. Even though you declined this today please call our office should you change your mind and we can give you the proper paperwork for you to fill out.  Conditions/risks identified: hypertension  Next appointment: 02/05/2019 @ 8:45 am   Preventive Care 65 Years and Older, Male Preventive care refers to lifestyle choices and visits with your health care provider that can promote health and wellness. What does preventive care include?  A yearly physical exam. This is also called an annual well check.  Dental exams once or twice a year.  Routine eye exams. Ask your health care provider how often you should have your eyes checked.  Personal lifestyle choices, including:  Daily care of your teeth and gums.  Regular physical activity.  Eating a healthy diet.  Avoiding tobacco and drug use.  Limiting alcohol use.  Practicing safe sex.  Taking low doses of aspirin every day.  Taking vitamin and mineral supplements as recommended by your health care provider. What happens during an annual well check? The services and screenings done by your health care provider during your annual well check will depend on your age, overall health, lifestyle risk factors, and family history  of disease. Counseling  Your health care provider may ask you questions about your:  Alcohol use.  Tobacco use.  Drug use.  Emotional well-being.  Home and relationship well-being.  Sexual activity.  Eating habits.  History of falls.  Memory and ability to understand (cognition).  Work and work Statistician. Screening  You may have the following tests or measurements:  Height, weight, and BMI.  Blood pressure.  Lipid and cholesterol levels. These may be checked every 5 years, or more frequently if you are over 23 years old.  Skin check.  Lung cancer screening. You may have this screening every year starting at age 57 if you have a 30-pack-year history of smoking and currently smoke or have quit within the past 15 years.  Fecal occult blood test (FOBT) of the stool. You may have this test every year starting at age 37.  Flexible sigmoidoscopy or colonoscopy. You may have a sigmoidoscopy every 5 years or a colonoscopy every 10 years starting at age 69.  Prostate cancer screening. Recommendations will vary depending on your family history and other risks.  Hepatitis C blood test.  Hepatitis B blood test.  Sexually transmitted disease (STD) testing.  Diabetes screening. This is done by checking your blood sugar (glucose) after you have not eaten for a while (fasting). You may have this done every 1-3 years.  Abdominal aortic aneurysm (AAA) screening. You may need this if you are a current or former smoker.  Osteoporosis. You may be screened starting at age 104 if you are at high risk. Talk with your health care provider about your test results, treatment options, and if necessary, the need for more tests. Vaccines  Your health care provider may recommend certain vaccines, such as:  Influenza vaccine. This is recommended every year.  Tetanus, diphtheria, and acellular pertussis (Tdap, Td) vaccine. You may need a Td booster every 10 years.  Zoster vaccine. You may  need this after age 54.  Pneumococcal 13-valent conjugate (PCV13) vaccine. One dose is recommended after age 45.  Pneumococcal polysaccharide (PPSV23) vaccine. One dose is recommended after age 101. Talk to your health care provider about which screenings and vaccines you need and how often you need them. This information is not intended to replace advice given to you by your health care provider. Make sure you discuss any questions you have with your health care provider. Document Released: 04/18/2015 Document Revised: 12/10/2015 Document Reviewed: 01/21/2015 Elsevier Interactive Patient Education  2017 [place] Prevention in the Home Falls can cause injuries. They can happen to people of all ages. There are many things you can do to make your home safe and to help prevent falls. What can I do on the outside of my home?  Regularly fix the edges of walkways and driveways and fix any cracks.  Remove anything that might make you trip as you walk through a door, such as a raised step or threshold.  Trim any bushes or trees on the path to your home.  Use bright outdoor lighting.  Clear any walking paths of anything that might make someone trip, such as rocks or tools.  Regularly check to see if handrails are loose or broken. Make sure that both sides of any steps have handrails.  Any raised decks and porches should have guardrails on the edges.  Have any leaves, snow, or ice cleared regularly.  Use sand or salt on walking paths during winter.  Clean up any spills in your garage right away. This includes oil or grease spills. What can I do in the bathroom?  Use night lights.  Install grab bars by the toilet and in the tub and shower. Do not use towel bars as grab bars.  Use non-skid mats or decals in the tub or shower.  If you need to sit down in the shower, use a plastic, non-slip stool.  Keep the floor dry. Clean up any water that spills on the floor as soon as it  happens.  Remove soap buildup in the tub or shower regularly.  Attach bath mats securely with double-sided non-slip rug tape.  Do not have throw rugs and other things on the floor that can make you trip. What can I do in the bedroom?  Use night lights.  Make sure that you have a light by your bed that is easy to reach.  Do not use any sheets or blankets that are too big for your bed. They should not hang down onto the floor.  Have a firm chair that has side arms. You can use this for support while you get dressed.  Do not have throw rugs and other things on the floor that can make you trip. What can I do in the kitchen?  Clean up any spills right away.  Avoid walking on wet floors.  Keep items that you use a lot in easy-to-reach places.  If you need to reach something above you, use a strong step stool that has a grab bar.  Keep electrical cords out of the way.  Do not use floor polish or wax that makes floors slippery. If you must use wax, use non-skid floor wax.  Do  not have throw rugs and other things on the floor that can make you trip. What can I do with my stairs?  Do not leave any items on the stairs.  Make sure that there are handrails on both sides of the stairs and use them. Fix handrails that are broken or loose. Make sure that handrails are as long as the stairways.  Check any carpeting to make sure that it is firmly attached to the stairs. Fix any carpet that is loose or worn.  Avoid having throw rugs at the top or bottom of the stairs. If you do have throw rugs, attach them to the floor with carpet tape.  Make sure that you have a light switch at the top of the stairs and the bottom of the stairs. If you do not have them, ask someone to add them for you. What else can I do to help prevent falls?  Wear shoes that:  Do not have high heels.  Have rubber bottoms.  Are comfortable and fit you well.  Are closed at the toe. Do not wear sandals.  If you  use a stepladder:  Make sure that it is fully opened. Do not climb a closed stepladder.  Make sure that both sides of the stepladder are locked into place.  Ask someone to hold it for you, if possible.  Clearly mark and make sure that you can see:  Any grab bars or handrails.  First and last steps.  Where the edge of each step is.  Use tools that help you move around (mobility aids) if they are needed. These include:  Canes.  Walkers.  Scooters.  Crutches.  Turn on the lights when you go into a dark area. Replace any light bulbs as soon as they burn out.  Set up your furniture so you have a clear path. Avoid moving your furniture around.  If any of your floors are uneven, fix them.  If there are any pets around you, be aware of where they are.  Review your medicines with your doctor. Some medicines can make you feel dizzy. This can increase your chance of falling. Ask your doctor what other things that you can do to help prevent falls. This information is not intended to replace advice given to you by your health care provider. Make sure you discuss any questions you have with your health care provider. Document Released: 01/16/2009 Document Revised: 08/28/2015 Document Reviewed: 04/26/2014 Elsevier Interactive Patient Education  2017 [employer].
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
## 2019-01-29 NOTE — Progress Notes (Signed)
PCP notes:  Health Maintenance: needs flu vaccine, Will check with his pharmacy regarding Shingrix.    Abnormal Screenings: none    Patient concerns: none    Nurse concerns: none    Next PCP appt.: 02/05/2019 @ 8:45 am

## 2019-01-30 DIAGNOSIS — L218 Other seborrheic dermatitis: Secondary | ICD-10-CM | POA: Diagnosis not present

## 2019-01-30 DIAGNOSIS — D692 Other nonthrombocytopenic purpura: Secondary | ICD-10-CM | POA: Diagnosis not present

## 2019-01-30 DIAGNOSIS — L570 Actinic keratosis: Secondary | ICD-10-CM | POA: Diagnosis not present

## 2019-01-30 DIAGNOSIS — Z85828 Personal history of other malignant neoplasm of skin: Secondary | ICD-10-CM | POA: Diagnosis not present

## 2019-02-04 ENCOUNTER — Telehealth: Payer: Self-pay | Admitting: Family Medicine

## 2019-02-04 NOTE — Telephone Encounter (Signed)
Called and LMOVM that I wouldn't be at clinic tomorrow due to an illness.  Please reschedule when possible.  I couldn't talk to patient directly.  Thanks.

## 2019-02-05 ENCOUNTER — Encounter: Payer: Medicare Other | Admitting: Family Medicine

## 2019-02-05 NOTE — Telephone Encounter (Signed)
Pt rescheduled to 02/09/19 @ 8am

## 2019-02-09 ENCOUNTER — Encounter: Payer: Self-pay | Admitting: Family Medicine

## 2019-02-09 ENCOUNTER — Other Ambulatory Visit: Payer: Self-pay

## 2019-02-09 ENCOUNTER — Ambulatory Visit (INDEPENDENT_AMBULATORY_CARE_PROVIDER_SITE_OTHER): Payer: Medicare Other | Admitting: Family Medicine

## 2019-02-09 ENCOUNTER — Ambulatory Visit: Payer: Medicare Other | Admitting: Family Medicine

## 2019-02-09 VITALS — BP 110/74 | HR 70 | Temp 97.6°F | Ht 71.75 in | Wt 197.5 lb

## 2019-02-09 DIAGNOSIS — Z23 Encounter for immunization: Secondary | ICD-10-CM

## 2019-02-09 DIAGNOSIS — G500 Trigeminal neuralgia: Secondary | ICD-10-CM

## 2019-02-09 DIAGNOSIS — N400 Enlarged prostate without lower urinary tract symptoms: Secondary | ICD-10-CM | POA: Diagnosis not present

## 2019-02-09 DIAGNOSIS — Z7189 Other specified counseling: Secondary | ICD-10-CM

## 2019-02-09 DIAGNOSIS — I10 Essential (primary) hypertension: Secondary | ICD-10-CM

## 2019-02-09 DIAGNOSIS — Z86718 Personal history of other venous thrombosis and embolism: Secondary | ICD-10-CM | POA: Diagnosis not present

## 2019-02-09 DIAGNOSIS — Z0000 Encounter for general adult medical examination without abnormal findings: Secondary | ICD-10-CM

## 2019-02-09 MED ORDER — LISINOPRIL 20 MG PO TABS: 20.0000 mg | ORAL_TABLET | Freq: Every day | ORAL | 3 refills | Status: DC

## 2019-02-09 MED ORDER — RIVAROXABAN 20 MG PO TABS: 20.0000 mg | ORAL_TABLET | Freq: Every evening | ORAL | 3 refills | Status: DC

## 2019-02-09 MED ORDER — TAMSULOSIN HCL 0.4 MG PO CAPS: 0.4000 mg | ORAL_CAPSULE | Freq: Every day | ORAL | 3 refills | Status: DC

## 2019-02-09 NOTE — Progress Notes (Signed)
Hypertension:    Using medication without problems or lightheadedness: yes Chest pain with exertion:no Edema:no Short of breath:no Labs d/w pt.   Bradycardia resolved off timolol. D/w pt.  LUTS, treated with flomax.  No ADE on med.  Reasonable to continue, d/w pt.   Shingrix d/w pt.  He'll get 2nd dose later on. Flu shot 2020   PNA up to date.   Tetanus 2016 Prev intermittent smoking, <30 year pack hx, wouldn't qualify for lung CA screening program. D/w pt. He understood.  Colonoscopy 2019 PSA wnl.   Advance directive- daughter [name] designated if patient were incapacitated. AAA screening not due given prev CT abd/pelvis in 2016  H/o DVT on anticoagulation.  No bleeding.  Some bruising as expected.  No blood in stool, no blood seen in urine.  No FCNAVD.  No calf pain.    H/o trigeminal neuralgia.  No pain but altered sensation on the L side of the face at baseline. No new weakness or tingling or other new neuro sx.    Meds, vitals, and allergies reviewed.   PMH and SH reviewed  ROS: Per HPI unless specifically indicated in ROS section   GEN: nad, alert and oriented HEENT: ncat, baseline change in sensation noted on the face.  Old finding. NECK: supple w/o LA CV: rrr. PULM: ctab, no inc wob ABD: soft, +bs EXT: no edema SKIN: no acute rash

## 2019-02-09 NOTE — Patient Instructions (Signed)
Flu shot today.  Update me as needed.  Change to 20mg  lisinopril.  Take care.  Glad to see you.

## 2019-02-11 NOTE — Assessment & Plan Note (Signed)
Advance directive- daughter Lynette designated if patient were incapacitated.  

## 2019-02-11 NOTE — Assessment & Plan Note (Addendum)
Labs d/w pt.   Bradycardia resolved off timolol. D/w pt. Change lisinopril to 20 mg tablet so he no longer has to break the tablet in half.  Is not a change in his total daily dose.  He will update me as needed.  Continue work on diet and exercise.  He agrees. >25 minutes spent in face to face time with patient, >50% spent in counselling or coordination of care

## 2019-02-11 NOTE — Assessment & Plan Note (Signed)
Shingrix d/w pt.  He'll get 2nd dose later on. Flu shot 2020   PNA up to date.   Tetanus 2016 Prev intermittent smoking, <30 year pack hx, wouldn't qualify for lung CA screening program. D/w pt. He understood.  Colonoscopy 2019 PSA wnl.   Advance directive- daughter [name] designated if patient were incapacitated. AAA screening not due given prev CT abd/pelvis in 2016

## 2019-02-11 NOTE — Assessment & Plan Note (Signed)
H/o trigeminal neuralgia.  No pain but altered sensation on the L side of the face at baseline. No new weakness or tingling or other new neuro sx. continue as is.

## 2019-02-11 NOTE — Assessment & Plan Note (Signed)
LUTS, treated with flomax.  No ADE on med.  Reasonable to continue, d/w pt.

## 2019-02-11 NOTE — Assessment & Plan Note (Signed)
H/o DVT on anticoagulation.  No bleeding.  Some bruising as expected.  No blood in stool, no blood seen in urine.  No FCNAVD.  No calf pain.   Would continue as is.  He agrees.

## 2019-03-09 DIAGNOSIS — Z79899 Other long term (current) drug therapy: Secondary | ICD-10-CM | POA: Diagnosis not present

## 2019-04-05 ENCOUNTER — Other Ambulatory Visit: Payer: Self-pay

## 2019-04-09 ENCOUNTER — Other Ambulatory Visit: Payer: Self-pay

## 2019-04-09 ENCOUNTER — Inpatient Hospital Stay: Payer: Medicare Other | Attending: Hematology and Oncology

## 2019-04-09 ENCOUNTER — Other Ambulatory Visit: Payer: Self-pay | Admitting: Family Medicine

## 2019-04-09 DIAGNOSIS — I825Y2 Chronic embolism and thrombosis of unspecified deep veins of left proximal lower extremity: Secondary | ICD-10-CM | POA: Diagnosis not present

## 2019-04-09 DIAGNOSIS — D6851 Activated protein C resistance: Secondary | ICD-10-CM | POA: Insufficient documentation

## 2019-04-09 DIAGNOSIS — I871 Compression of vein: Secondary | ICD-10-CM

## 2019-04-09 DIAGNOSIS — Z7901 Long term (current) use of anticoagulants: Secondary | ICD-10-CM | POA: Diagnosis not present

## 2019-04-09 LAB — COMPREHENSIVE METABOLIC PANEL
ALT: 37 U/L (ref 0–44)
AST: 25 U/L (ref 15–41)
Albumin: 4.1 g/dL (ref 3.5–5.0)
Alkaline Phosphatase: 63 U/L (ref 38–126)
Anion gap: 5 (ref 5–15)
BUN: 14 mg/dL (ref 8–23)
CO2: 26 mmol/L (ref 22–32)
Calcium: 9 mg/dL (ref 8.9–10.3)
Chloride: 108 mmol/L (ref 98–111)
Creatinine, Ser: 1.14 mg/dL (ref 0.61–1.24)
GFR calc Af Amer: >60 mL/min (ref >60)
GFR calc non Af Amer: >60 mL/min (ref >60)
Glucose, Bld: 122 mg/dL — ABNORMAL HIGH (ref 70–99)
Potassium: 4.2 mmol/L (ref 3.5–5.1)
Sodium: 139 mmol/L (ref 135–145)
Total Bilirubin: 0.6 mg/dL (ref 0.3–1.2)
Total Protein: 6.7 g/dL (ref 6.5–8.1)

## 2019-04-09 LAB — CBC WITH DIFFERENTIAL/PLATELET
Abs Immature Granulocytes: 0.03 10*3/uL (ref 0.00–0.07)
Basophils Absolute: 0.1 10*3/uL (ref 0.0–0.1)
Basophils Relative: 1 %
Eosinophils Absolute: 0.3 10*3/uL (ref 0.0–0.5)
Eosinophils Relative: 7 %
HCT: 45.5 % (ref 39.0–52.0)
Hemoglobin: 14.5 g/dL (ref 13.0–17.0)
Immature Granulocytes: 1 %
Lymphocytes Relative: 24 %
Lymphs Abs: 1 10*3/uL (ref 0.7–4.0)
MCH: 28.4 pg (ref 26.0–34.0)
MCHC: 31.9 g/dL (ref 30.0–36.0)
MCV: 89 fL (ref 80.0–100.0)
Monocytes Absolute: 0.4 10*3/uL (ref 0.1–1.0)
Monocytes Relative: 9 %
Neutro Abs: 2.3 10*3/uL (ref 1.7–7.7)
Neutrophils Relative %: 58 %
Platelets: 180 10*3/uL (ref 150–400)
RBC: 5.11 MIL/uL (ref 4.22–5.81)
RDW: 12.5 % (ref 11.5–15.5)
WBC: 3.9 10*3/uL — ABNORMAL LOW (ref 4.0–10.5)
nRBC: 0 % (ref 0.0–0.2)

## 2019-04-09 NOTE — Telephone Encounter (Signed)
Electronic refill request. Xarelto Last office visit:   02/09/2019 Last Filled:    90 tablet 3 02/09/2019   Please advise.

## 2019-04-10 ENCOUNTER — Encounter: Payer: Self-pay | Admitting: Family Medicine

## 2019-04-10 NOTE — Telephone Encounter (Signed)
Check with pt/pharmacy.  I thought this was already addressed/sent and he should have refills.  Thanks.

## 2019-04-10 NOTE — Telephone Encounter (Signed)
Thanks

## 2019-04-10 NOTE — Telephone Encounter (Signed)
Patient has gotten the problem straightened out and he has his medication.

## 2019-04-17 DIAGNOSIS — H401121 Primary open-angle glaucoma, left eye, mild stage: Secondary | ICD-10-CM | POA: Diagnosis not present

## 2019-04-17 DIAGNOSIS — H2513 Age-related nuclear cataract, bilateral: Secondary | ICD-10-CM | POA: Diagnosis not present

## 2019-04-17 DIAGNOSIS — H40011 Open angle with borderline findings, low risk, right eye: Secondary | ICD-10-CM | POA: Diagnosis not present

## 2019-04-17 DIAGNOSIS — H0015 Chalazion left lower eyelid: Secondary | ICD-10-CM | POA: Diagnosis not present

## 2019-04-18 DIAGNOSIS — Z0120 Encounter for dental examination and cleaning without abnormal findings: Secondary | ICD-10-CM | POA: Diagnosis not present

## 2019-07-16 DIAGNOSIS — R3914 Feeling of incomplete bladder emptying: Secondary | ICD-10-CM | POA: Diagnosis not present

## 2019-07-16 DIAGNOSIS — N323 Diverticulum of bladder: Secondary | ICD-10-CM | POA: Diagnosis not present

## 2019-07-16 DIAGNOSIS — N401 Enlarged prostate with lower urinary tract symptoms: Secondary | ICD-10-CM | POA: Diagnosis not present

## 2019-07-24 DIAGNOSIS — N281 Cyst of kidney, acquired: Secondary | ICD-10-CM | POA: Diagnosis not present

## 2019-08-14 DIAGNOSIS — H2513 Age-related nuclear cataract, bilateral: Secondary | ICD-10-CM | POA: Diagnosis not present

## 2019-08-14 DIAGNOSIS — H401121 Primary open-angle glaucoma, left eye, mild stage: Secondary | ICD-10-CM | POA: Diagnosis not present

## 2019-08-14 DIAGNOSIS — H43811 Vitreous degeneration, right eye: Secondary | ICD-10-CM | POA: Diagnosis not present

## 2019-08-14 DIAGNOSIS — H40011 Open angle with borderline findings, low risk, right eye: Secondary | ICD-10-CM | POA: Diagnosis not present

## 2019-08-22 DIAGNOSIS — H524 Presbyopia: Secondary | ICD-10-CM | POA: Diagnosis not present
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
## 2019-10-09 ENCOUNTER — Inpatient Hospital Stay: Payer: Medicare Other

## 2019-10-09 ENCOUNTER — Inpatient Hospital Stay: Payer: Medicare Other | Attending: Hematology and Oncology | Admitting: Hematology and Oncology

## 2019-10-09 ENCOUNTER — Other Ambulatory Visit: Payer: Self-pay

## 2019-10-09 ENCOUNTER — Encounter: Payer: Self-pay | Admitting: Hematology and Oncology

## 2019-10-09 VITALS — BP 115/79 | HR 61 | Temp 97.8°F | Resp 16 | Wt 195.5 lb

## 2019-10-09 DIAGNOSIS — I871 Compression of vein: Secondary | ICD-10-CM | POA: Insufficient documentation

## 2019-10-09 DIAGNOSIS — I82402 Acute embolism and thrombosis of unspecified deep veins of left lower extremity: Secondary | ICD-10-CM

## 2019-10-09 DIAGNOSIS — I82412 Acute embolism and thrombosis of left femoral vein: Secondary | ICD-10-CM | POA: Diagnosis not present

## 2019-10-09 DIAGNOSIS — D72819 Decreased white blood cell count, unspecified: Secondary | ICD-10-CM | POA: Diagnosis not present

## 2019-10-09 DIAGNOSIS — Z86718 Personal history of other venous thrombosis and embolism: Secondary | ICD-10-CM | POA: Diagnosis not present

## 2019-10-09 DIAGNOSIS — R200 Anesthesia of skin: Secondary | ICD-10-CM | POA: Diagnosis not present

## 2019-10-09 DIAGNOSIS — Z7901 Long term (current) use of anticoagulants: Secondary | ICD-10-CM | POA: Diagnosis not present

## 2019-10-09 DIAGNOSIS — D6851 Activated protein C resistance: Secondary | ICD-10-CM | POA: Diagnosis not present

## 2019-10-09 DIAGNOSIS — Z7982 Long term (current) use of aspirin: Secondary | ICD-10-CM | POA: Diagnosis not present

## 2019-10-09 DIAGNOSIS — Z79899 Other long term (current) drug therapy: Secondary | ICD-10-CM | POA: Insufficient documentation

## 2019-10-09 DIAGNOSIS — I825Y2 Chronic embolism and thrombosis of unspecified deep veins of left proximal lower extremity: Secondary | ICD-10-CM

## 2019-10-09 DIAGNOSIS — Z87891 Personal history of nicotine dependence: Secondary | ICD-10-CM | POA: Insufficient documentation

## 2019-10-09 DIAGNOSIS — I10 Essential (primary) hypertension: Secondary | ICD-10-CM | POA: Insufficient documentation

## 2019-10-09 DIAGNOSIS — R001 Bradycardia, unspecified: Secondary | ICD-10-CM | POA: Insufficient documentation

## 2019-10-09 DIAGNOSIS — Z85828 Personal history of other malignant neoplasm of skin: Secondary | ICD-10-CM | POA: Diagnosis not present

## 2019-10-09 DIAGNOSIS — K219 Gastro-esophageal reflux disease without esophagitis: Secondary | ICD-10-CM | POA: Insufficient documentation

## 2019-10-09 LAB — CBC WITH DIFFERENTIAL/PLATELET
Abs Immature Granulocytes: 0.02 10*3/uL (ref 0.00–0.07)
Basophils Absolute: 0.1 10*3/uL (ref 0.0–0.1)
Basophils Relative: 2 %
Eosinophils Absolute: 0.3 10*3/uL (ref 0.0–0.5)
Eosinophils Relative: 9 %
HCT: 44.7 % (ref 39.0–52.0)
Hemoglobin: 15 g/dL (ref 13.0–17.0)
Immature Granulocytes: 1 %
Lymphocytes Relative: 31 %
Lymphs Abs: 1.1 10*3/uL (ref 0.7–4.0)
MCH: 28.8 pg (ref 26.0–34.0)
MCHC: 33.6 g/dL (ref 30.0–36.0)
MCV: 85.8 fL (ref 80.0–100.0)
Monocytes Absolute: 0.4 10*3/uL (ref 0.1–1.0)
Monocytes Relative: 11 %
Neutro Abs: 1.7 10*3/uL (ref 1.7–7.7)
Neutrophils Relative %: 46 %
Platelets: 181 10*3/uL (ref 150–400)
RBC: 5.21 MIL/uL (ref 4.22–5.81)
RDW: 12.5 % (ref 11.5–15.5)
WBC: 3.5 10*3/uL — ABNORMAL LOW (ref 4.0–10.5)
nRBC: 0 % (ref 0.0–0.2)

## 2019-10-09 LAB — COMPREHENSIVE METABOLIC PANEL
ALT: 21 U/L (ref 0–44)
AST: 19 U/L (ref 15–41)
Albumin: 4.2 g/dL (ref 3.5–5.0)
Alkaline Phosphatase: 53 U/L (ref 38–126)
Anion gap: 5 (ref 5–15)
BUN: 16 mg/dL (ref 8–23)
CO2: 27 mmol/L (ref 22–32)
Calcium: 8.8 mg/dL — ABNORMAL LOW (ref 8.9–10.3)
Chloride: 106 mmol/L (ref 98–111)
Creatinine, Ser: 1.09 mg/dL (ref 0.61–1.24)
GFR calc Af Amer: >60 mL/min (ref >60)
GFR calc non Af Amer: >60 mL/min (ref >60)
Glucose, Bld: 112 mg/dL — ABNORMAL HIGH (ref 70–99)
Potassium: 4.2 mmol/L (ref 3.5–5.1)
Sodium: 138 mmol/L (ref 135–145)
Total Bilirubin: 0.7 mg/dL (ref 0.3–1.2)
Total Protein: 6.9 g/dL (ref 6.5–8.1)

## 2019-10-09 NOTE — Progress Notes (Signed)
Patient here for oncology follow-up appointment, expresses no complaints or concerns at this time.    

## 2019-10-31 ENCOUNTER — Other Ambulatory Visit: Payer: Self-pay | Admitting: Interventional Radiology

## 2019-10-31 DIAGNOSIS — I871 Compression of vein: Secondary | ICD-10-CM

## 2019-11-27 ENCOUNTER — Other Ambulatory Visit: Payer: Self-pay

## 2019-11-27 ENCOUNTER — Ambulatory Visit
Admission: RE | Admit: 2019-11-27 | Discharge: 2019-11-27 | Disposition: A | Discharge status: Home / Self Care | Payer: Medicare Other | Source: Ambulatory Visit | Attending: Interventional Radiology | Admitting: Interventional Radiology

## 2019-11-27 DIAGNOSIS — I82402 Acute embolism and thrombosis of unspecified deep veins of left lower extremity: Secondary | ICD-10-CM | POA: Diagnosis not present

## 2019-11-27 DIAGNOSIS — I82522 Chronic embolism and thrombosis of left iliac vein: Secondary | ICD-10-CM | POA: Diagnosis not present

## 2019-11-27 DIAGNOSIS — I871 Compression of vein: Secondary | ICD-10-CM

## 2019-11-27 HISTORY — PX: IR RADIOLOGIST EVAL & MGMT: IMG5224
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
## 2019-12-18 DIAGNOSIS — H43811 Vitreous degeneration, right eye: Secondary | ICD-10-CM | POA: Diagnosis not present

## 2019-12-18 DIAGNOSIS — H401121 Primary open-angle glaucoma, left eye, mild stage: Secondary | ICD-10-CM | POA: Diagnosis not present

## 2019-12-18 DIAGNOSIS — H40011 Open angle with borderline findings, low risk, right eye: Secondary | ICD-10-CM | POA: Diagnosis not present

## 2019-12-18 DIAGNOSIS — H2513 Age-related nuclear cataract, bilateral: Secondary | ICD-10-CM | POA: Diagnosis not present

## 2019-12-31 ENCOUNTER — Telehealth: Payer: Self-pay | Admitting: Family Medicine

## 2019-12-31 DIAGNOSIS — Z0120 Encounter for dental examination and cleaning without abnormal findings: Secondary | ICD-10-CM | POA: Diagnosis not present

## 2019-12-31 NOTE — Telephone Encounter (Signed)
LVM for pt to rtn my call to R/S appt with [facility] on 02/04/20

## 2020-01-01 ENCOUNTER — Other Ambulatory Visit: Payer: Self-pay | Admitting: Family Medicine

## 2020-01-01 NOTE — Telephone Encounter (Signed)
Electronic refill request. Xarelto Last office visit:   02/09/2019 Last Filled:     90 tablet 3 02/09/2019   Patient has Medicare Annual Exam scheduled.

## 2020-01-02 NOTE — Telephone Encounter (Signed)
Sent. Thanks.   

## 2020-01-27 ENCOUNTER — Other Ambulatory Visit: Payer: Self-pay | Admitting: Family Medicine

## 2020-01-27 DIAGNOSIS — Z125 Encounter for screening for malignant neoplasm of prostate: Secondary | ICD-10-CM

## 2020-01-27 DIAGNOSIS — I10 Essential (primary) hypertension: Secondary | ICD-10-CM

## 2020-02-04 ENCOUNTER — Other Ambulatory Visit: Payer: Self-pay

## 2020-02-04 ENCOUNTER — Ambulatory Visit: Payer: Medicare Other

## 2020-02-04 ENCOUNTER — Other Ambulatory Visit (INDEPENDENT_AMBULATORY_CARE_PROVIDER_SITE_OTHER): Payer: Medicare Other

## 2020-02-04 DIAGNOSIS — Z125 Encounter for screening for malignant neoplasm of prostate: Secondary | ICD-10-CM

## 2020-02-04 DIAGNOSIS — I10 Essential (primary) hypertension: Secondary | ICD-10-CM | POA: Diagnosis not present

## 2020-02-04 LAB — CBC WITH DIFFERENTIAL/PLATELET
Basophils Absolute: 0 10*3/uL (ref 0.0–0.1)
Basophils Relative: 1.1 % (ref 0.0–3.0)
Eosinophils Absolute: 0.4 10*3/uL (ref 0.0–0.7)
Eosinophils Relative: 8.4 % — ABNORMAL HIGH (ref 0.0–5.0)
HCT: 45.2 % (ref 39.0–52.0)
Hemoglobin: 15.2 g/dL (ref 13.0–17.0)
Lymphocytes Relative: 26.1 % (ref 12.0–46.0)
Lymphs Abs: 1.2 10*3/uL (ref 0.7–4.0)
MCHC: 33.5 g/dL (ref 30.0–36.0)
MCV: 86.9 fl (ref 78.0–100.0)
Monocytes Absolute: 0.4 10*3/uL (ref 0.1–1.0)
Monocytes Relative: 8.5 % (ref 3.0–12.0)
Neutro Abs: 2.5 10*3/uL (ref 1.4–7.7)
Neutrophils Relative %: 55.9 % (ref 43.0–77.0)
Platelets: 179 10*3/uL (ref 150.0–400.0)
RBC: 5.2 Mil/uL (ref 4.22–5.81)
RDW: 13.4 % (ref 11.5–15.5)
WBC: 4.4 10*3/uL (ref 4.0–10.5)

## 2020-02-04 LAB — LIPID PANEL
Cholesterol: 191 mg/dL (ref 0–200)
HDL: 59.1 mg/dL (ref >39.00)
LDL Cholesterol: 117 mg/dL — ABNORMAL HIGH (ref 0–99)
NonHDL: 131.54
Total CHOL/HDL Ratio: 3
Triglycerides: 71 mg/dL (ref 0.0–149.0)
VLDL: 14.2 mg/dL (ref 0.0–40.0)

## 2020-02-04 LAB — COMPREHENSIVE METABOLIC PANEL
ALT: 29 U/L (ref 0–53)
AST: 19 U/L (ref 0–37)
Albumin: 4.4 g/dL (ref 3.5–5.2)
Alkaline Phosphatase: 65 U/L (ref 39–117)
BUN: 18 mg/dL (ref 6–23)
CO2: 28 mEq/L (ref 19–32)
Calcium: 9 mg/dL (ref 8.4–10.5)
Chloride: 105 mEq/L (ref 96–112)
Creatinine, Ser: 1.08 mg/dL (ref 0.40–1.50)
GFR: 68.45 mL/min (ref >60.00)
Glucose, Bld: 99 mg/dL (ref 70–99)
Potassium: 3.7 mEq/L (ref 3.5–5.1)
Sodium: 142 mEq/L (ref 135–145)
Total Bilirubin: 0.7 mg/dL (ref 0.2–1.2)
Total Protein: 6.5 g/dL (ref 6.0–8.3)

## 2020-02-04 LAB — PSA, MEDICARE: PSA: 1.1 ng/ml (ref 0.10–4.00)

## 2020-02-11 ENCOUNTER — Ambulatory Visit (INDEPENDENT_AMBULATORY_CARE_PROVIDER_SITE_OTHER): Payer: Medicare Other | Admitting: Family Medicine

## 2020-02-11 ENCOUNTER — Encounter: Payer: Self-pay | Admitting: Family Medicine

## 2020-02-11 ENCOUNTER — Other Ambulatory Visit: Payer: Self-pay

## 2020-02-11 VITALS — BP 118/70 | HR 60 | Temp 97.3°F | Ht 71.5 in | Wt 195.0 lb

## 2020-02-11 DIAGNOSIS — D6851 Activated protein C resistance: Secondary | ICD-10-CM

## 2020-02-11 DIAGNOSIS — I10 Essential (primary) hypertension: Secondary | ICD-10-CM

## 2020-02-11 DIAGNOSIS — G500 Trigeminal neuralgia: Secondary | ICD-10-CM

## 2020-02-11 DIAGNOSIS — Z7189 Other specified counseling: Secondary | ICD-10-CM

## 2020-02-11 DIAGNOSIS — N400 Enlarged prostate without lower urinary tract symptoms: Secondary | ICD-10-CM

## 2020-02-11 DIAGNOSIS — Z0000 Encounter for general adult medical examination without abnormal findings: Secondary | ICD-10-CM

## 2020-02-11 MED ORDER — LOSARTAN POTASSIUM 50 MG PO TABS: 50.0000 mg | ORAL_TABLET | Freq: Every day | ORAL | 3 refills | Status: DC

## 2020-02-11 MED ORDER — TAMSULOSIN HCL 0.4 MG PO CAPS
0.4000 mg | ORAL_CAPSULE | Freq: Every day | ORAL | 3 refills | Status: DC
Start: 2020-02-11 — End: 2021-02-12

## 2020-02-11 NOTE — Progress Notes (Signed)
This visit occurred during the SARS-CoV-2 public health emergency.  Safety protocols were in place, including screening questions prior to the visit, additional usage of staff PPE, and extensive cleaning of exam room while observing appropriate contact time as indicated for disinfecting solutions.  I have personally reviewed the Medicare Annual Wellness questionnaire and have noted 1. The patient's medical and social history 2. Their use of alcohol, tobacco or illicit drugs 3. Their current medications and supplements 4. The patient's functional ability including ADL's, fall risks, home safety risks and hearing or visual             impairment. 5. Diet and physical activities 6. Evidence for depression or mood disorders  The patients weight, height, BMI have been recorded in the chart and visual acuity is per eye clinic.  I have made referrals, counseling and provided education to the patient based review of the above and I have provided the pt with a written personalized care plan for preventive services.  Provider list updated- see scanned forms.  Routine anticipatory guidance given to patient.  See health maintenance. The possibility exists that previously documented standard health maintenance information may have been brought forward from a previous encounter into this note.  If needed, that same information has been updated to reflect the current situation based on today's encounter.    Flu 2021 Shingles up-to-date PNA up-to-date Tetanus 2016 Covid vaccine 2021 Colonoscopy 2019 Prostate cancer screening 2021 Advance directive- daughter [name] designated if patient were incapacitated. Cognitive function addressed- see scanned forms- and if abnormal then additional documentation follows.   H/o trigeminal neuralgia.  He has some sensitivity on the L ear canal.  He isn't having the pain but has persistent numbness on the L side of the face.    Hypertension:    Using medication  without problems or lightheadedness: yes Chest pain with exertion:no Edema:no Short of breath:no He has a dry cough.  No sputum.  See avs.  D/w pt about ARB substitution for ACE  Labs d/w pt.   Diet and exercise d/w pt.    H/o DVT, still anticoagulated.  Bruising but not bleeding.  No blood in stool, urine, etc.  Had hematology f/u with plan for long term anticoagulation.    BPH.  Still on flomax.  No ADE on med.  PSA wnl.  Had urology f/u this spring.    PMH and SH reviewed  Meds, vitals, and allergies reviewed.   ROS: Per HPI.  Unless specifically indicated otherwise in HPI, the patient denies:  General: fever. Eyes: acute vision changes ENT: sore throat Cardiovascular: chest pain Respiratory: SOB GI: vomiting GU: dysuria Musculoskeletal: acute back pain Derm: acute rash Neuro: acute motor dysfunction Psych: worsening mood Endocrine: polydipsia Heme: bleeding Allergy: hayfever  GEN: nad, alert and oriented HEENT: ncat, B TMs and canals wnl.  He does have decreased sensation on the left side of the face at baseline with normal smile/facial movement. NECK: supple w/o LA CV: rrr. PULM: ctab, no inc wob ABD: soft, +bs EXT: no edema SKIN: well perfused.

## 2020-02-11 NOTE — Patient Instructions (Addendum)
Take care.  Glad to see you.  Stop lisinopril.  Change to losartan.  If the cough doesn't get better, let me know.  If your BP is >140/>90 or if you are lightheaded, then let me know.    Update me as needed.

## 2020-02-14 DIAGNOSIS — Z0000 Encounter for general adult medical examination without abnormal findings: Secondary | ICD-10-CM | POA: Insufficient documentation

## 2020-02-14 NOTE — Assessment & Plan Note (Signed)
Advance directive- daughter Lynette designated if patient were incapacitated.  

## 2020-02-14 NOTE — Assessment & Plan Note (Signed)
H/o DVT, still anticoagulated.  Bruising but not bleeding.  No blood in stool, urine, etc.  Had hematology f/u with plan for long term anticoagulation.   Continue Xarelto as is.

## 2020-02-14 NOTE — Assessment & Plan Note (Signed)
His painH/o trigeminal neuralgia.  He has some sensitivity on the L ear canal.  He isn't having the pain but has persistent numbness on the L side of the face.  Overall he is improved compared to the level but the persistent numbness is annoying to the patient.  I would continue as is.  He agrees.

## 2020-02-14 NOTE — Assessment & Plan Note (Signed)
Still on flomax.  No ADE on med.  PSA wnl.  Had urology f/u this spring.   Continue Flomax.  He agrees.

## 2020-02-14 NOTE — Assessment & Plan Note (Signed)
Flu 2021 Shingles up-to-date PNA up-to-date Tetanus 2016 Covid vaccine 2021 Colonoscopy 2019 Prostate cancer screening 2021 Advance directive- daughter [name] designated if patient were incapacitated. Cognitive function addressed- see scanned forms- and if abnormal then additional documentation follows.

## 2020-02-14 NOTE — Assessment & Plan Note (Signed)
He has a dry cough.  No sputum.  See avs.  D/w pt about ARB substitution for ACE  Labs d/w pt.   Diet and exercise d/w pt.   Stop lisinopril.  Change losartan.  He will update me as needed.

## 2020-02-19 ENCOUNTER — Other Ambulatory Visit: Payer: Self-pay

## 2020-02-19 ENCOUNTER — Ambulatory Visit: Payer: Medicare Other

## 2020-03-03 DIAGNOSIS — L820 Inflamed seborrheic keratosis: Secondary | ICD-10-CM | POA: Diagnosis not present

## 2020-03-03 DIAGNOSIS — D492 Neoplasm of unspecified behavior of bone, soft tissue, and skin: Secondary | ICD-10-CM | POA: Diagnosis not present

## 2020-03-08 ENCOUNTER — Ambulatory Visit
Admission: EM | Admit: 2020-03-08 | Discharge: 2020-03-08 | Disposition: A | Discharge status: Home / Self Care | Payer: Medicare Other

## 2020-03-24 DIAGNOSIS — Z85828 Personal history of other malignant neoplasm of skin: Secondary | ICD-10-CM | POA: Diagnosis not present

## 2020-03-24 DIAGNOSIS — D692 Other nonthrombocytopenic purpura: Secondary | ICD-10-CM | POA: Diagnosis not present

## 2020-03-24 DIAGNOSIS — L570 Actinic keratosis: Secondary | ICD-10-CM | POA: Diagnosis not present

## 2020-03-24 DIAGNOSIS — L821 Other seborrheic keratosis: Secondary | ICD-10-CM | POA: Diagnosis not present

## 2020-04-07 ENCOUNTER — Inpatient Hospital Stay: Payer: Medicare Other | Attending: Hematology and Oncology

## 2020-04-07 ENCOUNTER — Encounter: Payer: Self-pay | Admitting: Family Medicine

## 2020-04-07 ENCOUNTER — Ambulatory Visit (INDEPENDENT_AMBULATORY_CARE_PROVIDER_SITE_OTHER): Payer: Medicare Other | Admitting: Family Medicine

## 2020-04-07 ENCOUNTER — Other Ambulatory Visit: Payer: Self-pay

## 2020-04-07 DIAGNOSIS — Z86718 Personal history of other venous thrombosis and embolism: Secondary | ICD-10-CM | POA: Diagnosis not present

## 2020-04-07 DIAGNOSIS — M25569 Pain in unspecified knee: Secondary | ICD-10-CM

## 2020-04-07 DIAGNOSIS — D6851 Activated protein C resistance: Secondary | ICD-10-CM

## 2020-04-07 DIAGNOSIS — Z7982 Long term (current) use of aspirin: Secondary | ICD-10-CM | POA: Insufficient documentation

## 2020-04-07 DIAGNOSIS — Z7901 Long term (current) use of anticoagulants: Secondary | ICD-10-CM | POA: Insufficient documentation

## 2020-04-07 DIAGNOSIS — I82402 Acute embolism and thrombosis of unspecified deep veins of left lower extremity: Secondary | ICD-10-CM

## 2020-04-07 DIAGNOSIS — I871 Compression of vein: Secondary | ICD-10-CM

## 2020-04-07 LAB — COMPREHENSIVE METABOLIC PANEL
ALT: 33 U/L (ref 0–44)
AST: 23 U/L (ref 15–41)
Albumin: 4.2 g/dL (ref 3.5–5.0)
Alkaline Phosphatase: 67 U/L (ref 38–126)
Anion gap: 8 (ref 5–15)
BUN: 23 mg/dL (ref 8–23)
CO2: 25 mmol/L (ref 22–32)
Calcium: 8.9 mg/dL (ref 8.9–10.3)
Chloride: 105 mmol/L (ref 98–111)
Creatinine, Ser: 1.08 mg/dL (ref 0.61–1.24)
GFR, Estimated: >60 mL/min (ref >60)
Glucose, Bld: 110 mg/dL — ABNORMAL HIGH (ref 70–99)
Potassium: 4 mmol/L (ref 3.5–5.1)
Sodium: 138 mmol/L (ref 135–145)
Total Bilirubin: 0.5 mg/dL (ref 0.3–1.2)
Total Protein: 6.6 g/dL (ref 6.5–8.1)

## 2020-04-07 LAB — CBC WITH DIFFERENTIAL/PLATELET
Abs Immature Granulocytes: 0.03 10*3/uL (ref 0.00–0.07)
Basophils Absolute: 0.1 10*3/uL (ref 0.0–0.1)
Basophils Relative: 1 %
Eosinophils Absolute: 0.3 10*3/uL (ref 0.0–0.5)
Eosinophils Relative: 7 %
HCT: 45.5 % (ref 39.0–52.0)
Hemoglobin: 15.5 g/dL (ref 13.0–17.0)
Immature Granulocytes: 1 %
Lymphocytes Relative: 29 %
Lymphs Abs: 1.3 10*3/uL (ref 0.7–4.0)
MCH: 29.4 pg (ref 26.0–34.0)
MCHC: 34.1 g/dL (ref 30.0–36.0)
MCV: 86.3 fL (ref 80.0–100.0)
Monocytes Absolute: 0.4 10*3/uL (ref 0.1–1.0)
Monocytes Relative: 10 %
Neutro Abs: 2.3 10*3/uL (ref 1.7–7.7)
Neutrophils Relative %: 52 %
Platelets: 192 10*3/uL (ref 150–400)
RBC: 5.27 MIL/uL (ref 4.22–5.81)
RDW: 12.3 % (ref 11.5–15.5)
WBC: 4.4 10*3/uL (ref 4.0–10.5)
nRBC: 0 % (ref 0.0–0.2)

## 2020-04-07 MED ORDER — DICLOFENAC SODIUM 1 % EX GEL: 2.0000 g | Freq: Four times a day (QID) | CUTANEOUS | 2 refills | Status: DC | PRN

## 2020-04-07 NOTE — Patient Instructions (Signed)
Keep using the brace and ice for 5 minutes at a time.  If needed, use diclofenac gel. Take care.  Glad to see you.
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
## 2020-04-14 DIAGNOSIS — M25569 Pain in unspecified knee: Secondary | ICD-10-CM | POA: Insufficient documentation

## 2020-04-14 NOTE — Assessment & Plan Note (Signed)
Improved in the meantime.  He could had a flare of pre-existing osteoarthritic changes.  Discussed options. Keep using his brace and ice for 5 minutes at a time.  If needed, use diclofenac gel.  He will update me as needed.  He agrees to plan.

## 2020-04-23 DIAGNOSIS — U071 COVID-19: Secondary | ICD-10-CM | POA: Diagnosis not present

## 2020-06-06 ENCOUNTER — Other Ambulatory Visit: Payer: Self-pay

## 2020-06-06 ENCOUNTER — Encounter: Payer: Self-pay | Admitting: Family Medicine

## 2020-06-06 ENCOUNTER — Ambulatory Visit (INDEPENDENT_AMBULATORY_CARE_PROVIDER_SITE_OTHER): Payer: Medicare Other | Admitting: Family Medicine

## 2020-06-06 DIAGNOSIS — M25569 Pain in unspecified knee: Secondary | ICD-10-CM

## 2020-06-06 MED ORDER — TRAMADOL HCL 50 MG PO TABS: 50.0000 mg | ORAL_TABLET | Freq: Three times a day (TID) | ORAL | 0 refills | Status: AC | PRN

## 2020-06-06 MED ORDER — DICLOFENAC SODIUM 1 % EX GEL
2.0000 g | Freq: Four times a day (QID) | CUTANEOUS | 2 refills | Status: DC | PRN
Start: 2020-06-06 — End: 2020-10-07

## 2020-06-06 NOTE — Patient Instructions (Signed)
I would try diclofenac gel.  If that doesn't help, then try tramadol.   Update me as needed.  Take care.  Glad to see you.

## 2020-06-06 NOTE — Progress Notes (Signed)
This visit occurred during the SARS-CoV-2 public health emergency.  Safety protocols were in place, including screening questions prior to the visit, additional usage of staff PPE, and extensive cleaning of exam room while observing appropriate contact time as indicated for disinfecting solutions.  L knee pain.  No pain on ROM now but prev with discomfort.  Pain going down steps, more than going up steps.  Some days better than others, more pain prev in the week.    Still anticoagulated.  Has been icing.  No bruising.  No falls but missed a step out of bed in the AM, stepped on a pillow about 3 months ago.    He hasn't tried diclofenac gel yet.    Meds, vitals, and allergies reviewed.   ROS: Per HPI unless specifically indicated in ROS section   nad ncat L knee intact on testing ACL MCL and LCL.  Miinimally puffy but not bruised.  Able to bear weight.  No locking.  Skin well perfused.  Good range of motion.

## 2020-06-07 NOTE — Assessment & Plan Note (Signed)
I still suspect underlying mild arthritis with episodic flare, causing discomfort.  Discussed options. I would try diclofenac gel.  If that doesn't help, then try tramadol.   Update me as needed.  He agrees with plan.

## 2020-06-11 ENCOUNTER — Telehealth: Payer: Self-pay | Admitting: *Deleted

## 2020-06-11 NOTE — Telephone Encounter (Signed)
PA was started yesterday in covermymeds for diclofenac 1% gel; key: [id number]  Called [employer] back with number provided and pressed option 5; got message stating that department is closed due to a departmental event.

## 2020-06-11 NOTE — Telephone Encounter (Signed)
A message was left on voicemail stating that they were calling about a PA for Diclofenac Gel. The message left was that they needed to know if this medication is for pain related to osteoarthritis of the knee.  When calling back press option 5 No name was left on the message

## 2020-06-17 NOTE — Telephone Encounter (Signed)
PA was denied; did not give reason why on covermymeds. Will await faxed paperwork that should be sent.

## 2020-07-01 ENCOUNTER — Other Ambulatory Visit: Payer: Self-pay | Admitting: Family Medicine

## 2020-07-15 DIAGNOSIS — N401 Enlarged prostate with lower urinary tract symptoms: Secondary | ICD-10-CM | POA: Diagnosis not present

## 2020-07-21 DIAGNOSIS — N401 Enlarged prostate with lower urinary tract symptoms: Secondary | ICD-10-CM | POA: Diagnosis not present

## 2020-07-21 DIAGNOSIS — N323 Diverticulum of bladder: Secondary | ICD-10-CM | POA: Diagnosis not present

## 2020-07-21 DIAGNOSIS — R3914 Feeling of incomplete bladder emptying: Secondary | ICD-10-CM | POA: Diagnosis not present

## 2020-07-29 DIAGNOSIS — H2513 Age-related nuclear cataract, bilateral: Secondary | ICD-10-CM | POA: Diagnosis not present

## 2020-07-29 DIAGNOSIS — H43811 Vitreous degeneration, right eye: Secondary | ICD-10-CM | POA: Diagnosis not present

## 2020-07-29 DIAGNOSIS — H401121 Primary open-angle glaucoma, left eye, mild stage: Secondary | ICD-10-CM | POA: Diagnosis not present

## 2020-07-29 DIAGNOSIS — H40011 Open angle with borderline findings, low risk, right eye: Secondary | ICD-10-CM | POA: Diagnosis not present

## 2020-09-29 ENCOUNTER — Other Ambulatory Visit: Payer: Self-pay | Admitting: Family Medicine

## 2020-10-07 ENCOUNTER — Inpatient Hospital Stay: Payer: Medicare Other | Admitting: Internal Medicine

## 2020-10-07 ENCOUNTER — Encounter: Payer: Self-pay | Admitting: Internal Medicine

## 2020-10-07 ENCOUNTER — Inpatient Hospital Stay: Payer: Medicare Other | Attending: Internal Medicine

## 2020-10-07 ENCOUNTER — Other Ambulatory Visit: Payer: Self-pay

## 2020-10-07 ENCOUNTER — Other Ambulatory Visit: Payer: Self-pay | Admitting: *Deleted

## 2020-10-07 VITALS — BP 142/88 | HR 59 | Temp 98.0°F | Resp 20 | Ht 71.0 in | Wt 195.3 lb

## 2020-10-07 DIAGNOSIS — I82402 Acute embolism and thrombosis of unspecified deep veins of left lower extremity: Secondary | ICD-10-CM

## 2020-10-07 DIAGNOSIS — D72819 Decreased white blood cell count, unspecified: Secondary | ICD-10-CM

## 2020-10-07 DIAGNOSIS — Z86718 Personal history of other venous thrombosis and embolism: Secondary | ICD-10-CM | POA: Insufficient documentation

## 2020-10-07 DIAGNOSIS — Z85828 Personal history of other malignant neoplasm of skin: Secondary | ICD-10-CM | POA: Diagnosis not present

## 2020-10-07 DIAGNOSIS — Z8042 Family history of malignant neoplasm of prostate: Secondary | ICD-10-CM | POA: Diagnosis not present

## 2020-10-07 DIAGNOSIS — D6851 Activated protein C resistance: Secondary | ICD-10-CM

## 2020-10-07 DIAGNOSIS — Z800 Family history of malignant neoplasm of digestive organs: Secondary | ICD-10-CM | POA: Insufficient documentation

## 2020-10-07 DIAGNOSIS — I825Y2 Chronic embolism and thrombosis of unspecified deep veins of left proximal lower extremity: Secondary | ICD-10-CM

## 2020-10-07 DIAGNOSIS — Z87891 Personal history of nicotine dependence: Secondary | ICD-10-CM | POA: Insufficient documentation

## 2020-10-07 DIAGNOSIS — I871 Compression of vein: Secondary | ICD-10-CM | POA: Diagnosis not present

## 2020-10-07 DIAGNOSIS — Z809 Family history of malignant neoplasm, unspecified: Secondary | ICD-10-CM | POA: Diagnosis not present

## 2020-10-07 DIAGNOSIS — Z7901 Long term (current) use of anticoagulants: Secondary | ICD-10-CM | POA: Diagnosis not present

## 2020-10-07 LAB — CBC WITH DIFFERENTIAL/PLATELET
Abs Immature Granulocytes: 0.02 10*3/uL (ref 0.00–0.07)
Basophils Absolute: 0.1 10*3/uL (ref 0.0–0.1)
Basophils Relative: 2 %
Eosinophils Absolute: 0.4 10*3/uL (ref 0.0–0.5)
Eosinophils Relative: 9 %
HCT: 46.4 % (ref 39.0–52.0)
Hemoglobin: 15.8 g/dL (ref 13.0–17.0)
Immature Granulocytes: 1 %
Lymphocytes Relative: 26 %
Lymphs Abs: 1.1 10*3/uL (ref 0.7–4.0)
MCH: 29.5 pg (ref 26.0–34.0)
MCHC: 34.1 g/dL (ref 30.0–36.0)
MCV: 86.7 fL (ref 80.0–100.0)
Monocytes Absolute: 0.4 10*3/uL (ref 0.1–1.0)
Monocytes Relative: 10 %
Neutro Abs: 2.1 10*3/uL (ref 1.7–7.7)
Neutrophils Relative %: 52 %
Platelets: 176 10*3/uL (ref 150–400)
RBC: 5.35 MIL/uL (ref 4.22–5.81)
RDW: 12.6 % (ref 11.5–15.5)
WBC: 4.1 10*3/uL (ref 4.0–10.5)
nRBC: 0 % (ref 0.0–0.2)

## 2020-10-07 LAB — COMPREHENSIVE METABOLIC PANEL
ALT: 23 U/L (ref 0–44)
AST: 22 U/L (ref 15–41)
Albumin: 4.6 g/dL (ref 3.5–5.0)
Alkaline Phosphatase: 62 U/L (ref 38–126)
Anion gap: 7 (ref 5–15)
BUN: 19 mg/dL (ref 8–23)
CO2: 28 mmol/L (ref 22–32)
Calcium: 9.3 mg/dL (ref 8.9–10.3)
Chloride: 105 mmol/L (ref 98–111)
Creatinine, Ser: 1.03 mg/dL (ref 0.61–1.24)
GFR, Estimated: >60 mL/min (ref >60)
Glucose, Bld: 92 mg/dL (ref 70–99)
Potassium: 3.8 mmol/L (ref 3.5–5.1)
Sodium: 140 mmol/L (ref 135–145)
Total Bilirubin: 1 mg/dL (ref 0.3–1.2)
Total Protein: 7.1 g/dL (ref 6.5–8.1)

## 2020-10-07 NOTE — Assessment & Plan Note (Addendum)
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  No signs of any progression/recurrence.  Bleeding noted.  # DISPOSITION: future appts in [place] # RTC in 6 months for labs (CBC with diff, CMP).  #   RTC in 1 year for MD assessment and labs (CBC with diff, CMP)- Dr.B.
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
## 2020-10-13 ENCOUNTER — Other Ambulatory Visit: Payer: Self-pay | Admitting: Interventional Radiology

## 2020-10-13 DIAGNOSIS — I871 Compression of vein: Secondary | ICD-10-CM

## 2020-11-04 ENCOUNTER — Ambulatory Visit
Admission: RE | Admit: 2020-11-04 | Discharge: 2020-11-04 | Disposition: A | Discharge status: Home / Self Care | Payer: Medicare Other | Source: Ambulatory Visit | Attending: Interventional Radiology | Admitting: Interventional Radiology

## 2020-11-04 DIAGNOSIS — I871 Compression of vein: Secondary | ICD-10-CM

## 2020-11-04 DIAGNOSIS — Z86718 Personal history of other venous thrombosis and embolism: Secondary | ICD-10-CM | POA: Diagnosis not present

## 2020-11-04 HISTORY — PX: IR RADIOLOGIST EVAL & MGMT: IMG5224
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
## 2020-11-18 DIAGNOSIS — H401121 Primary open-angle glaucoma, left eye, mild stage: Secondary | ICD-10-CM | POA: Diagnosis not present

## 2020-11-18 DIAGNOSIS — H40011 Open angle with borderline findings, low risk, right eye: Secondary | ICD-10-CM | POA: Diagnosis not present

## 2020-11-18 DIAGNOSIS — H2513 Age-related nuclear cataract, bilateral: Secondary | ICD-10-CM | POA: Diagnosis not present

## 2020-11-18 DIAGNOSIS — H43811 Vitreous degeneration, right eye: Secondary | ICD-10-CM | POA: Diagnosis not present

## 2020-12-29 ENCOUNTER — Other Ambulatory Visit: Payer: Self-pay | Admitting: Family Medicine

## 2021-01-12 ENCOUNTER — Encounter: Payer: Self-pay | Admitting: Family Medicine

## 2021-01-12 ENCOUNTER — Other Ambulatory Visit: Payer: Self-pay

## 2021-01-12 ENCOUNTER — Ambulatory Visit (INDEPENDENT_AMBULATORY_CARE_PROVIDER_SITE_OTHER): Payer: Medicare Other | Admitting: Family Medicine

## 2021-01-12 VITALS — BP 118/80 | HR 70 | Temp 98.5°F | Ht 71.0 in | Wt 194.0 lb

## 2021-01-12 DIAGNOSIS — Z23 Encounter for immunization: Secondary | ICD-10-CM | POA: Diagnosis not present

## 2021-01-12 DIAGNOSIS — R42 Dizziness and giddiness: Secondary | ICD-10-CM

## 2021-01-12 MED ORDER — MECLIZINE HCL 25 MG PO TABS: 25.0000 mg | ORAL_TABLET | Freq: Three times a day (TID) | ORAL | 0 refills | Status: DC | PRN

## 2021-01-12 NOTE — Patient Instructions (Signed)
Use meclizine if needed and let me know if you have recurrent symptoms.  Take care.  Glad to see you.

## 2021-01-12 NOTE — Progress Notes (Signed)
This visit occurred during the SARS-CoV-2 public health emergency.  Safety protocols were in place, including screening questions prior to the visit, additional usage of staff PPE, and extensive cleaning of exam room while observing appropriate contact time as indicated for disinfecting solutions.  He had sx yesterday.  As soon as he walked into his house he had acute changes.  Was dizzy and nauseated but didn't fall.  He was able to get to his recliner.  He didn't have severe room spinning but had a sensation of movement with this episode.  No ear pain.  No fevers, no chills.  No vomiting. Woke up this AM with minimal sx, improved from last night.  No sx now.  No numbness or tingling in the arms.  No focal motor changes.  No double vision.  No vision loss.  He used an ice pack on his neck after the sx started.  No abd pain.    He feels "pretty much good" now, ie clearly better.  He was able to eat today and felt better.  There was no clear trigger yesterday- no diet change, no prolonged fasting.  No blood in urine or dysuria.  No stool changes.    He had covid in ~04/2020.    Meds, vitals, and allergies reviewed.   ROS: Per HPI unless specifically indicated in ROS section  GEN: nad, alert and oriented HEENT: ncat, TM wnl B NECK: supple w/o LA CV: rrr. PULM: ctab, no inc wob ABD: soft, +bs EXT: no edema SKIN: no acute rash CN 2-12 wnl B, S/S wnl x4 except for baseline L facial paresthesia that is longstanding.   Normal finger to nose testing bilaterally.

## 2021-01-14 DIAGNOSIS — R42 Dizziness and giddiness: Secondary | ICD-10-CM | POA: Insufficient documentation

## 2021-01-14 NOTE — Assessment & Plan Note (Signed)
He has a history of vertigo and this could have been an atypical presentation with relatively mild symptoms/associated nausea.  Normal neurologic exam at this point (accounting for his previous facial numbness).  I do not suspect an ominous diagnosis.  He agrees with observation.  Reasonable to use meclizine as needed with routine cautions and he will update me as needed.

## 2021-02-02 ENCOUNTER — Other Ambulatory Visit: Payer: Self-pay | Admitting: Family Medicine

## 2021-02-05 ENCOUNTER — Other Ambulatory Visit: Payer: Medicare Other

## 2021-02-12 ENCOUNTER — Ambulatory Visit (INDEPENDENT_AMBULATORY_CARE_PROVIDER_SITE_OTHER): Payer: Medicare Other | Admitting: Family Medicine

## 2021-02-12 ENCOUNTER — Encounter: Payer: Self-pay | Admitting: Family Medicine

## 2021-02-12 ENCOUNTER — Other Ambulatory Visit: Payer: Self-pay

## 2021-02-12 VITALS — BP 122/82 | HR 65 | Temp 97.3°F | Ht 71.0 in | Wt 195.0 lb

## 2021-02-12 DIAGNOSIS — Z0000 Encounter for general adult medical examination without abnormal findings: Secondary | ICD-10-CM | POA: Diagnosis not present

## 2021-02-12 DIAGNOSIS — N400 Enlarged prostate without lower urinary tract symptoms: Secondary | ICD-10-CM | POA: Diagnosis not present

## 2021-02-12 DIAGNOSIS — Z7189 Other specified counseling: Secondary | ICD-10-CM

## 2021-02-12 DIAGNOSIS — G500 Trigeminal neuralgia: Secondary | ICD-10-CM

## 2021-02-12 DIAGNOSIS — I10 Essential (primary) hypertension: Secondary | ICD-10-CM

## 2021-02-12 DIAGNOSIS — Z125 Encounter for screening for malignant neoplasm of prostate: Secondary | ICD-10-CM

## 2021-02-12 DIAGNOSIS — D6851 Activated protein C resistance: Secondary | ICD-10-CM | POA: Diagnosis not present

## 2021-02-12 LAB — CBC WITH DIFFERENTIAL/PLATELET
Basophils Absolute: 0 10*3/uL (ref 0.0–0.1)
Basophils Relative: 1 % (ref 0.0–3.0)
Eosinophils Absolute: 0.4 10*3/uL (ref 0.0–0.7)
Eosinophils Relative: 8.7 % — ABNORMAL HIGH (ref 0.0–5.0)
HCT: 47.1 % (ref 39.0–52.0)
Hemoglobin: 15.5 g/dL (ref 13.0–17.0)
Lymphocytes Relative: 27.7 % (ref 12.0–46.0)
Lymphs Abs: 1.1 10*3/uL (ref 0.7–4.0)
MCHC: 32.9 g/dL (ref 30.0–36.0)
MCV: 88.1 fl (ref 78.0–100.0)
Monocytes Absolute: 0.4 10*3/uL (ref 0.1–1.0)
Monocytes Relative: 9.3 % (ref 3.0–12.0)
Neutro Abs: 2.2 10*3/uL (ref 1.4–7.7)
Neutrophils Relative %: 53.3 % (ref 43.0–77.0)
Platelets: 172 10*3/uL (ref 150.0–400.0)
RBC: 5.34 Mil/uL (ref 4.22–5.81)
RDW: 13.2 % (ref 11.5–15.5)
WBC: 4.1 10*3/uL (ref 4.0–10.5)

## 2021-02-12 LAB — COMPREHENSIVE METABOLIC PANEL
ALT: 18 U/L (ref 0–53)
AST: 19 U/L (ref 0–37)
Albumin: 4.5 g/dL (ref 3.5–5.2)
Alkaline Phosphatase: 63 U/L (ref 39–117)
BUN: 17 mg/dL (ref 6–23)
CO2: 28 mEq/L (ref 19–32)
Calcium: 9.3 mg/dL (ref 8.4–10.5)
Chloride: 106 mEq/L (ref 96–112)
Creatinine, Ser: 1.03 mg/dL (ref 0.40–1.50)
GFR: 71.93 mL/min (ref >60.00)
Glucose, Bld: 94 mg/dL (ref 70–99)
Potassium: 4.4 mEq/L (ref 3.5–5.1)
Sodium: 140 mEq/L (ref 135–145)
Total Bilirubin: 0.7 mg/dL (ref 0.2–1.2)
Total Protein: 6.6 g/dL (ref 6.0–8.3)

## 2021-02-12 LAB — PSA, MEDICARE: PSA: 0.92 ng/ml (ref 0.10–4.00)

## 2021-02-12 LAB — LIPID PANEL
Cholesterol: 185 mg/dL (ref 0–200)
HDL: 57.2 mg/dL (ref >39.00)
LDL Cholesterol: 116 mg/dL — ABNORMAL HIGH (ref 0–99)
NonHDL: 128.15
Total CHOL/HDL Ratio: 3
Triglycerides: 60 mg/dL (ref 0.0–149.0)
VLDL: 12 mg/dL (ref 0.0–40.0)

## 2021-02-12 MED ORDER — RIVAROXABAN 20 MG PO TABS: 20.0000 mg | ORAL_TABLET | Freq: Every evening | ORAL | 3 refills | Status: DC

## 2021-02-12 MED ORDER — LOSARTAN POTASSIUM 50 MG PO TABS: 50.0000 mg | ORAL_TABLET | Freq: Every day | ORAL | 3 refills | Status: DC

## 2021-02-12 MED ORDER — TAMSULOSIN HCL 0.4 MG PO CAPS: 0.4000 mg | ORAL_CAPSULE | Freq: Every day | ORAL | 3 refills | Status: DC

## 2021-02-12 NOTE — Patient Instructions (Addendum)
Go to the lab on the way out.   If you have mychart we'll likely use that to update you.    Take care.  Glad to see you. Update me as needed.   I'll update Dr. B with the blood clinic.    You can try tapering off flomax.  If you notice a change, then restart it.

## 2021-02-12 NOTE — Progress Notes (Signed)
This visit occurred during the SARS-CoV-2 public health emergency.  Safety protocols were in place, including screening questions prior to the visit, additional usage of staff PPE, and extensive cleaning of exam room while observing appropriate contact time as indicated for disinfecting solutions.  I have personally reviewed the Medicare Annual Wellness questionnaire and have noted 1. The patient's medical and social history 2. Their use of alcohol, tobacco or illicit drugs 3. Their current medications and supplements 4. The patient's functional ability including ADL's, fall risks, home safety risks and hearing or visual             impairment. 5. Diet and physical activities 6. Evidence for depression or mood disorders  The patients weight, height, BMI have been recorded in the chart and visual acuity is per eye clinic.  I have made referrals, counseling and provided education to the patient based review of the above and I have provided the pt with a written personalized care plan for preventive services.  Provider list updated- see scanned forms.  Routine anticipatory guidance given to patient.  See health maintenance. The possibility exists that previously documented standard health maintenance information may have been brought forward from a previous encounter into this note.  If needed, that same information has been updated to reflect the current situation based on today's encounter.    Flu 2022 Shingles up-to-date PNA up-to-date Tetanus 2016 Pain COVID-vaccine previously done Colonoscopy 2019 Prostate cancer screening-pending. Advance directive- daughter [name] designated if patient were incapacitated.  Cognitive function addressed- see scanned forms- and if abnormal then additional documentation follows.    In addition to [facility], follow up visit for the below conditions:  Anticoagulated.  No bleeding except for mild bleeding with brushing his teeth.  That predates  xarelto use.  No severe bleeding.  He has a soft tooth brush.  Xarelto continues to be expensive for patient.  I put in referral to pharmacy to see about getting help with that.    Hypertension:    Using medication without problems or lightheadedness: yes Chest pain with exertion: no Edema: no Short of breath: no Labs pending.    BPH s/p TURP.  Still on flomax.  Stream is good.  D/w pt about options, he could try tapering off the medicine.    H/o trigeminal neuralgia.  Still with facial numbness at baseline but not having pain like previous.    His son died this year of an overdose, d/w pt.  Condolences offered.  PMH and SH reviewed  Meds, vitals, and allergies reviewed.   ROS: Per HPI.  Unless specifically indicated otherwise in HPI, the patient denies:  General: fever. Eyes: acute vision changes ENT: sore throat Cardiovascular: chest pain Respiratory: SOB GI: vomiting GU: dysuria Musculoskeletal: acute back pain Derm: acute rash Neuro: acute motor dysfunction Psych: worsening mood Endocrine: polydipsia Heme: bleeding Allergy: hayfever  GEN: nad, alert and oriented HEENT: ncat, paresthesia on left side of the face at baseline. NECK: supple w/o LA CV: rrr. PULM: ctab, no inc wob ABD: soft, +bs EXT: no edema SKIN: no acute rash

## 2021-02-15 ENCOUNTER — Encounter: Payer: Self-pay | Admitting: Family Medicine

## 2021-02-15 NOTE — Assessment & Plan Note (Signed)
Controlled.  See notes on labs.  Continue losartan.

## 2021-02-15 NOTE — Assessment & Plan Note (Signed)
Flu 2022 Shingles up-to-date PNA up-to-date Tetanus 2016 Pain COVID-vaccine previously done Colonoscopy 2019 Prostate cancer screening-pending. Advance directive- daughter [name] designated if patient were incapacitated.  Cognitive function addressed- see scanned forms- and if abnormal then additional documentation follows.

## 2021-02-15 NOTE — Assessment & Plan Note (Signed)
  Advance directive- daughter [name] designated if patient were incapacitated.

## 2021-02-15 NOTE — Assessment & Plan Note (Signed)
Continue anticoagulation with Xarelto.  I put in referral to pharmacy to see if we get help paying for medication.

## 2021-02-15 NOTE — Assessment & Plan Note (Signed)
Discussed that since he had previous TURP he can try tapering Flomax but restart if needed.

## 2021-02-15 NOTE — Assessment & Plan Note (Signed)
With residual paresthesia but fortunately his pain is clearly better.

## 2021-02-24 ENCOUNTER — Encounter (HOSPITAL_BASED_OUTPATIENT_CLINIC_OR_DEPARTMENT_OTHER): Payer: Self-pay

## 2021-02-24 ENCOUNTER — Emergency Department (HOSPITAL_BASED_OUTPATIENT_CLINIC_OR_DEPARTMENT_OTHER)
Admission: EM | Admit: 2021-02-24 | Discharge: 2021-02-24 | Disposition: A | Discharge status: Home / Self Care | Payer: Medicare Other | Attending: Emergency Medicine | Admitting: Emergency Medicine

## 2021-02-24 ENCOUNTER — Other Ambulatory Visit: Payer: Self-pay

## 2021-02-24 ENCOUNTER — Emergency Department (HOSPITAL_BASED_OUTPATIENT_CLINIC_OR_DEPARTMENT_OTHER): Payer: Medicare Other

## 2021-02-24 DIAGNOSIS — I10 Essential (primary) hypertension: Secondary | ICD-10-CM | POA: Diagnosis not present

## 2021-02-24 DIAGNOSIS — Z85828 Personal history of other malignant neoplasm of skin: Secondary | ICD-10-CM | POA: Insufficient documentation

## 2021-02-24 DIAGNOSIS — Z7982 Long term (current) use of aspirin: Secondary | ICD-10-CM | POA: Insufficient documentation

## 2021-02-24 DIAGNOSIS — Z8616 Personal history of COVID-19: Secondary | ICD-10-CM | POA: Insufficient documentation

## 2021-02-24 DIAGNOSIS — Z79899 Other long term (current) drug therapy: Secondary | ICD-10-CM | POA: Diagnosis not present

## 2021-02-24 DIAGNOSIS — M62838 Other muscle spasm: Secondary | ICD-10-CM | POA: Insufficient documentation

## 2021-02-24 DIAGNOSIS — M25551 Pain in right hip: Secondary | ICD-10-CM | POA: Insufficient documentation

## 2021-02-24 DIAGNOSIS — M79604 Pain in right leg: Secondary | ICD-10-CM | POA: Insufficient documentation

## 2021-02-24 DIAGNOSIS — M79651 Pain in right thigh: Secondary | ICD-10-CM | POA: Diagnosis not present

## 2021-02-24 MED ORDER — CYCLOBENZAPRINE HCL 10 MG PO TABS: 10.0000 mg | ORAL_TABLET | Freq: Two times a day (BID) | ORAL | 0 refills | Status: DC | PRN

## 2021-02-24 MED ORDER — METHYLPREDNISOLONE 4 MG PO TBPK: ORAL_TABLET | ORAL | 0 refills | Status: DC

## 2021-02-24 NOTE — Discharge Instructions (Addendum)
Recommend 1000 mg of Tylenol every 6 hours as needed for pain.  Recommend massage and hot baths to help loosen up muscles.  Take Flexeril as needed to help with muscle spasm as well.  This medicine is sedating and do not mix with alcohol or drugs or driving or heavy machinery use.  Typically good for before bed.  Take Medrol Dosepak as an anti-inflammatory to help with possible inflammation.  Follow-up with your primary care doctor or with sports medicine which number was provided.  Would recommend using a cane/walking stick until you feel better as well.

## 2021-02-24 NOTE — ED Triage Notes (Signed)
Pt reports he fell last Saturday landing on his Right knee. Pt c/o pain from his Right thigh up into his Right hip. Swelling noted to Right upper thigh
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
## 2021-02-25 ENCOUNTER — Telehealth: Payer: Self-pay

## 2021-02-25 NOTE — Telephone Encounter (Signed)
Called patient reviewed all information and repeated back to me. Will call if any questions.  ? ?

## 2021-02-25 NOTE — Telephone Encounter (Signed)
Per chart review tab pt seen 02/24/21 at [facility] ED. Sending note to Dr Darnell Level and Lattie Haw CMA.

## 2021-02-25 NOTE — Telephone Encounter (Signed)
Noted. Thanks.  I saw the ER note.  Please have him f/u as needed.

## 2021-02-25 NOTE — Telephone Encounter (Signed)
[facility] - Client TELEPHONE ADVICE RECORD [facility] Patient Name: [name] ORD Gender: Male DOB: 1945-07-26 Age: 75 Y 9 M 20 D Return Phone Number: [phone] (Primary) Address: City/ State/ Zip: [place]  [address] Client [name] - Client Client Site [facility] - Day Provider Renford Dills - MD Contact Type Call Who Is Calling Patient / Member / Family / Caregiver Call Type Triage / Clinical Relationship To Patient Self Return Phone Number [phone] (Primary) Chief Complaint Leg Swelling And Edema Reason for Call Symptomatic / Request for [place] states the patient has right leg pain, and it's red, and swollen. Translation No Nurse Assessment Nurse: Owens Shark, RN, Noah Delaine Date/Time Eilene Ghazi Time): 02/24/2021 4:51:29 PM Confirm and document reason for call. If symptomatic, describe symptoms. ---Caller states right leg is painful, red, and swollen. [place] a few days ago, has been sore since. Hip hurts as well Does the patient have any new or worsening symptoms? ---Yes Will a triage be completed? ---Yes Related visit to physician within the last 2 weeks? ---No Does the PT have any chronic conditions? (i.e. diabetes, asthma, this includes High risk factors for pregnancy, etc.) ---No Is this a behavioral health or substance abuse call? ---No Guidelines Guideline Title Affirmed Question Affirmed Notes Nurse Date/Time Eilene Ghazi Time) Leg Injury [1] SEVERE pain (e.g., excruciating pain, unable to do any normal activities) AND [2] not improved 2 hours after pain medicine/ ice packs Owens Shark, RN, Noah Delaine 02/24/2021 4:53:11 PM Disp. Time Eilene Ghazi Time) Disposition Final User PLEASE NOTE: All timestamps contained within this report are represented as [place] Standard Time. CONFIDENTIALTY NOTICE: This fax transmission is intended only for the  addressee. It contains information that is legally privileged, confidential or otherwise protected from use or disclosure. If you are not the intended recipient, you are strictly prohibited from reviewing, disclosing, copying using or disseminating any of this information or taking any action in reliance on or regarding this information. If you have received this fax in error, please notify us immediately by telephone so that we can arrange for its return to [place]. Phone: [phone], Toll-Free: [phone], Fax: [phone] Page: 2 of 2 Call Id: [id number] 02/24/2021 4:57:47 PM See HCP within 4 Hours (or PCP triage) Yes Owens Shark, RN, Lenice Llamas Disagree/Comply Comply Caller Understands Yes PreDisposition Call Doctor Care Advice Given Per Guideline SEE HCP (OR PCP TRIAGE) WITHIN 4 HOURS: * ED: Patients who may need surgery or hospital admission need to be sent to an ED. So do most patients with serious symptoms or complex medical problems. * UCC: Some UCCs can manage patients who are stable and have less serious symptoms (e.g., minor illnesses and injuries). The triager must know the [facility] capabilities before sending a patient there. If unsure, call ahead. * IF OFFICE WILL BE CLOSED AND PCP SECOND-LEVEL TRIAGE REQUIRED: You may need to be seen. Your doctor (or NP/PA) will want to talk with you to decide what's best. I'll page the on-call provider now. If you haven't heard from the provider (or me) within 30 minutes, call again. NOTE: If on-call provider can't be reached, send to [facility] or ED. CALL BACK IF: * You become worse CARE ADVICE given per Leg Injury (Adult) guideline. Referrals [place]

## 2021-03-23 DIAGNOSIS — H2513 Age-related nuclear cataract, bilateral: Secondary | ICD-10-CM | POA: Diagnosis not present

## 2021-03-23 DIAGNOSIS — H401121 Primary open-angle glaucoma, left eye, mild stage: Secondary | ICD-10-CM | POA: Diagnosis not present

## 2021-03-23 DIAGNOSIS — H40011 Open angle with borderline findings, low risk, right eye: Secondary | ICD-10-CM | POA: Diagnosis not present

## 2021-03-23 DIAGNOSIS — H43811 Vitreous degeneration, right eye: Secondary | ICD-10-CM | POA: Diagnosis not present

## 2021-04-05 HISTORY — PX: PROSTATE SURGERY: SHX751

## 2021-04-13 ENCOUNTER — Inpatient Hospital Stay: Payer: Medicare Other | Attending: Internal Medicine

## 2021-04-24 ENCOUNTER — Ambulatory Visit (INDEPENDENT_AMBULATORY_CARE_PROVIDER_SITE_OTHER): Payer: Medicare Other | Admitting: Family Medicine

## 2021-04-24 ENCOUNTER — Other Ambulatory Visit: Payer: Self-pay

## 2021-04-24 DIAGNOSIS — I10 Essential (primary) hypertension: Secondary | ICD-10-CM

## 2021-04-24 DIAGNOSIS — R42 Dizziness and giddiness: Secondary | ICD-10-CM | POA: Diagnosis not present

## 2021-04-24 DIAGNOSIS — H539 Unspecified visual disturbance: Secondary | ICD-10-CM

## 2021-04-24 DIAGNOSIS — N400 Enlarged prostate without lower urinary tract symptoms: Secondary | ICD-10-CM | POA: Diagnosis not present

## 2021-04-24 MED ORDER — MECLIZINE HCL 25 MG PO TABS: 25.0000 mg | ORAL_TABLET | Freq: Three times a day (TID) | ORAL | 2 refills | Status: DC | PRN

## 2021-04-24 NOTE — Patient Instructions (Addendum)
Please call the eye clinic about follow up and tell them about your situation.    Don't change your meds yet but check your BP at home when at rest.  If your BP is persistently above 140/90, then let me know.   Likely BPV.  Try the bedside exercise if needed along with meclizine.  Please update me as needed. It should gradually get better and you should have a period of time without symptoms but it can return.   Take care.  Glad to see you.

## 2021-04-24 NOTE — Progress Notes (Signed)
This visit occurred during the SARS-CoV-2 public health emergency.  Safety protocols were in place, including screening questions prior to the visit, additional usage of staff PPE, and extensive cleaning of exam room while observing appropriate contact time as indicated for disinfecting solutions.  He talked to the eye clinic about vision changes prev.  He has blurry vision at 10 feet now and that is a change for the patient, he had to move his chair closer to the TV a home.  D/w pt about getting eye clinic f/u.  This is not an acute change.  He tapered off flomax and is doing well off med, no change in baseline urine stream.    We talked about blood pressure elevation and wanted him to check his blood pressure at home and then update me.  See after visit summary.  Vertigo.  1 week of sx.  Not all the time.  Can turn over in the bed, sometimes with and sometimes without sx.  He did have room spinning with some episodes in the last week.  Happens when getting up from a chair.  No syncope.  H/o vertigo in years past.  Prev used meclizine, unclear how much relief he had from med with this episode.  It had helped with prev episodes.  He has L sided facial numbness at baseline, no change in sx.    Ear canal dryness improved with PRN hydrocortisone tx.    Meds, vitals, and allergies reviewed.   ROS: Per HPI unless specifically indicated in ROS section   GEN: nad, alert and oriented HEENT: mucous membranes moist, TM wnl B, canals wnl, no cerumen impaction.  Left facial numbness noted at baseline. EOMI NECK: supple w/o LA CV: rrr.  PULM: ctab, no inc wob ABD: soft, +bs EXT: no edema SKIN: Well-perfused Dix-Hallpike negative bilaterally.

## 2021-04-25 ENCOUNTER — Encounter (HOSPITAL_COMMUNITY): Payer: Self-pay | Admitting: Emergency Medicine

## 2021-04-25 ENCOUNTER — Emergency Department (HOSPITAL_COMMUNITY)
Admission: EM | Admit: 2021-04-25 | Discharge: 2021-04-25 | Disposition: A | Discharge status: Home / Self Care | Payer: Medicare Other | Attending: Emergency Medicine | Admitting: Emergency Medicine

## 2021-04-25 ENCOUNTER — Other Ambulatory Visit: Payer: Self-pay

## 2021-04-25 DIAGNOSIS — Z7901 Long term (current) use of anticoagulants: Secondary | ICD-10-CM | POA: Diagnosis not present

## 2021-04-25 DIAGNOSIS — R030 Elevated blood-pressure reading, without diagnosis of hypertension: Secondary | ICD-10-CM | POA: Diagnosis present

## 2021-04-25 DIAGNOSIS — Z79899 Other long term (current) drug therapy: Secondary | ICD-10-CM | POA: Diagnosis not present

## 2021-04-25 DIAGNOSIS — I10 Essential (primary) hypertension: Secondary | ICD-10-CM | POA: Diagnosis not present

## 2021-04-25 DIAGNOSIS — Z7982 Long term (current) use of aspirin: Secondary | ICD-10-CM | POA: Diagnosis not present

## 2021-04-25 LAB — BASIC METABOLIC PANEL
Anion gap: 7 (ref 5–15)
BUN: 11 mg/dL (ref 8–23)
CO2: 25 mmol/L (ref 22–32)
Calcium: 9.5 mg/dL (ref 8.9–10.3)
Chloride: 108 mmol/L (ref 98–111)
Creatinine, Ser: 0.93 mg/dL (ref 0.61–1.24)
GFR, Estimated: >60 mL/min (ref >60)
Glucose, Bld: 108 mg/dL — ABNORMAL HIGH (ref 70–99)
Potassium: 3.7 mmol/L (ref 3.5–5.1)
Sodium: 140 mmol/L (ref 135–145)

## 2021-04-25 LAB — CBC
HCT: 47.2 % (ref 39.0–52.0)
Hemoglobin: 16 g/dL (ref 13.0–17.0)
MCH: 29.7 pg (ref 26.0–34.0)
MCHC: 33.9 g/dL (ref 30.0–36.0)
MCV: 87.7 fL (ref 80.0–100.0)
Platelets: 183 10*3/uL (ref 150–400)
RBC: 5.38 MIL/uL (ref 4.22–5.81)
RDW: 12 % (ref 11.5–15.5)
WBC: 5.4 10*3/uL (ref 4.0–10.5)
nRBC: 0 % (ref 0.0–0.2)

## 2021-04-25 MED ORDER — SODIUM CHLORIDE 0.9% FLUSH: 3.0000 mL | Freq: Once | INTRAVENOUS | Status: DC

## 2021-04-25 NOTE — ED Provider Notes (Signed)
[facility] EMERGENCY DEPARTMENT Provider Note   CSN: [id number] Arrival date & time: 04/25/21  0725     History  Chief Complaint  Patient presents with   Hypertension    [name] is a 76 y.o. male.  Patient with a known history of hypertension.  Seen by his primary care doctor on Friday.  Blood pressure there was 140/90.  His doctor told him to kind of follow the blood pressure.  Also his doctor is treating him for intermittent vertigo and was be given a prescription of Antivert but patient has not got that filled yet.  But states his dizziness was much worse a week ago and has improved some.  Patient went to pharmacy last night got blood pressures 200.  Called the on-call nurse.  He was told he had to be seen immediately.  Patient without any chest pain no headache no new strokelike symptoms.  Patient has had vertigo but that is improved significantly.  Patient without any shortness of breath.      Home Medications Prior to Admission medications   Medication Sig Start Date End Date Taking? Authorizing Provider  aspirin EC 81 MG tablet Take 81 mg by mouth daily.     [provider]  bimatoprost (LUMIGAN) 0.01 % SOLN Place 1 drop into both eyes at bedtime.    [provider]  cyclobenzaprine (FLEXERIL) 10 MG tablet Take 1 tablet (10 mg total) by mouth 2 (two) times daily as needed for up to 20 doses for muscle spasms. 02/24/21   Curatolo, Adam, DO  losartan (COZAAR) 50 MG tablet Take 1 tablet (50 mg total) by mouth daily. 02/12/21   Tonia Ghent, MD  meclizine (ANTIVERT) 25 MG tablet Take 1 tablet (25 mg total) by mouth 3 (three) times daily as needed for dizziness or nausea (sedation caution). 04/24/21   Tonia Ghent, MD  Omega-3 Fatty Acids (FISH OIL) 1000 MG CPDR Take 1,000 mg by mouth daily.    [provider]  psyllium (METAMUCIL) 58.6 % packet Take 1 packet by mouth daily.    [provider]  rivaroxaban  (XARELTO) 20 MG TABS tablet Take 1 tablet (20 mg total) by mouth every evening. 02/12/21   Tonia Ghent, MD      Allergies    Beta adrenergic blockers, Lisinopril, and Codeine    Review of Systems   Review of Systems  Constitutional:  Negative for chills and fever.  HENT:  Negative for ear pain and sore throat.   Eyes:  Negative for pain and visual disturbance.  Respiratory:  Negative for cough and shortness of breath.   Cardiovascular:  Negative for chest pain and palpitations.  Gastrointestinal:  Negative for abdominal pain and vomiting.  Genitourinary:  Negative for dysuria and hematuria.  Musculoskeletal:  Negative for arthralgias and back pain.  Skin:  Negative for color change and rash.  Neurological:  Positive for dizziness. Negative for seizures and syncope.  All other systems reviewed and are negative.  Physical Exam Updated Vital Signs BP (!) 163/111    Pulse (!) 56    Temp 97.7 F (36.5 C)    Resp 19    SpO2 98%  Physical Exam Vitals and nursing note reviewed.  Constitutional:      General: He is not in acute distress.    Appearance: Normal appearance. He is well-developed.  HENT:     Head: Normocephalic and atraumatic.  Eyes:     Extraocular Movements:  Extraocular movements intact.     Conjunctiva/sclera: Conjunctivae normal.     Pupils: Pupils are equal, round, and reactive to light.  Cardiovascular:     Rate and Rhythm: Normal rate and regular rhythm.     Heart sounds: No murmur heard. Pulmonary:     Effort: Pulmonary effort is normal. No respiratory distress.     Breath sounds: Normal breath sounds. No wheezing, rhonchi or rales.  Abdominal:     Palpations: Abdomen is soft.     Tenderness: There is no abdominal tenderness.  Musculoskeletal:        General: No swelling.     Cervical back: Normal range of motion and neck supple. No rigidity.  Skin:    General: Skin is warm and dry.     Capillary Refill: Capillary refill takes less than 2 seconds.   Neurological:     General: No focal deficit present.     Mental Status: He is alert and oriented to person, place, and time.     Cranial Nerves: No cranial nerve deficit.     Sensory: No sensory deficit.     Motor: No weakness.     Coordination: Coordination normal.  Psychiatric:        Mood and Affect: Mood normal.    ED Results / Procedures / Treatments   Labs (all labs ordered are listed, but only abnormal results are displayed) Labs Reviewed  BASIC METABOLIC PANEL - Abnormal; Notable for the following components:      Result Value   Glucose, Bld 108 (*)    All other components within normal limits  CBC  URINALYSIS, ROUTINE W REFLEX MICROSCOPIC    EKG EKG Interpretation  Date/Time:  Saturday April 25 2021 07:34:49 EST Ventricular Rate:  61 PR Interval:  182 QRS Duration: 98 QT Interval:  402 QTC Calculation: 404 R Axis:   66 Text Interpretation: Normal sinus rhythm Normal ECG When compared with ECG of 30-Apr-2015 08:41, PREVIOUS ECG IS PRESENT Confirmed by Fredia Sorrow [phone]) on 04/25/2021 10:21:29 AM  Radiology No results found.  Procedures Procedures  Chronic monitoring without any arrhythmias  Medications Ordered in ED Medications  sodium chloride flush (NS) 0.9 % injection 3 mL ( Intravenous Canceled Entry 04/25/21 7412)    ED Course/ Medical Decision Making/ A&P                           Medical Decision Making Amount and/or Complexity of Data Reviewed Labs: ordered.  Patient did take an extra blood pressure medicine last night.  Arrived here with a blood pressure of 186/101.  Has taken his blood pressure medicine he normally takes this morning.  Cozaar.  And has been on that long-term.  Has had blood pressure for a number years.  But has been well controlled.  Was reasonable in the office on Friday.  Did get some elevated levels yesterday.  Did arrive here with an elevated level.  Currently blood pressure is 144/84.  His trend has been reasonable  with systolics anywhere from 1 59-1 39.  Patient without any systemic symptoms at all.  Labs here are very normal.  Basic metabolic panel is normal kidney function is normal no leukocytosis.  Hemoglobin normal at 16.  EKG without any acute changes.   Again patient without any systemic symptoms.  Would recommend patient do a blood pressure log give his primary care doctor call on Monday.  Patient given instructions to return for any  strokelike symptoms.  Any severe chest pain any trouble breathing or any severe headache.  Based on today's blood pressure trends.  If that holds up patient probably will need some adjustment on his blood pressure medicine but will leave that to the primary care doctor.  No evidence of any hypertensive emergency.  Final Clinical Impression(s) / ED Diagnoses Final diagnoses:  Primary hypertension    Rx / DC Orders ED Discharge Orders     None         Fredia Sorrow, MD 04/25/21 1028

## 2021-04-25 NOTE — Discharge Instructions (Addendum)
Give your primary care doctor a call.  Record your blood pressures over the weekend.  Just once daily when you are relaxed.  Based on today's trend you may require some adjustment of your blood pressure medicine but is too early to tell for sure.  Return for any severe headache any strokelike symptoms.  Any chest pain or trouble breathing.  Continue to take your current blood pressure medicine.

## 2021-04-25 NOTE — ED Triage Notes (Signed)
Pt reports hypertension.  Went to PCP yesterday for intermittent dizziness x 1 week.  States PCP had diagnosed him with inner ear problems.  BP elevated at PCP office and was told to let PCP know if BP greater from 140/90.  States he went to [employer] last night and BP 200s.

## 2021-04-26 ENCOUNTER — Telehealth: Payer: Self-pay | Admitting: Family Medicine

## 2021-04-26 DIAGNOSIS — H539 Unspecified visual disturbance: Secondary | ICD-10-CM | POA: Insufficient documentation

## 2021-04-26 NOTE — Assessment & Plan Note (Signed)
He can monitor his blood pressure and update me if elevated.

## 2021-04-26 NOTE — Assessment & Plan Note (Signed)
Not a chronic issue.  I asked him to follow-up with the eye clinic.

## 2021-04-26 NOTE — Assessment & Plan Note (Signed)
Likely BPV.  Discussed pathophysiology and home bedside exercise.  He can use meclizine as needed.  It does appear that he is improved compared to symptoms earlier in the week.  He can update me as needed.

## 2021-04-26 NOTE — Telephone Encounter (Addendum)
Please get update on patient re: BP at home after going to ER.  Please let me know.   Thanks.

## 2021-04-26 NOTE — Assessment & Plan Note (Signed)
He tapered off flomax and is doing well off med, no change in baseline urine stream.   Reasonable to continue off Flomax for now.

## 2021-04-27 ENCOUNTER — Encounter: Payer: Self-pay | Admitting: Family Medicine

## 2021-04-27 ENCOUNTER — Ambulatory Visit (INDEPENDENT_AMBULATORY_CARE_PROVIDER_SITE_OTHER): Payer: Medicare Other | Admitting: Family Medicine

## 2021-04-27 ENCOUNTER — Other Ambulatory Visit: Payer: Self-pay

## 2021-04-27 DIAGNOSIS — I10 Essential (primary) hypertension: Secondary | ICD-10-CM | POA: Diagnosis not present

## 2021-04-27 MED ORDER — LOSARTAN POTASSIUM 50 MG PO TABS: 50.0000 mg | ORAL_TABLET | Freq: Every day | ORAL | 3 refills | Status: DC

## 2021-04-27 NOTE — Patient Instructions (Addendum)
Take 1 losartan 50mg  a day.  If BP is above 140/90, then take an extra 1/2 tab.  Let me know how that goes.   If you don't get better from vertigo, then let me know.    Update me about your BP and losartan use in the next week or so.  Schedule a visit on the way out for next week.    Take care.  Glad to see you.

## 2021-04-27 NOTE — Telephone Encounter (Signed)
Patient scheduled for an appointment today 04/27/21 at 3:00 pm with Dr. Damita Dunnings.

## 2021-04-27 NOTE — Telephone Encounter (Signed)
Noted. Thanks.

## 2021-04-27 NOTE — Telephone Encounter (Signed)
PLEASE NOTE: All timestamps contained within this report are represented as [place] Standard Time. CONFIDENTIALTY NOTICE: This fax transmission is intended only for the addressee. It contains information that is legally privileged, confidential or otherwise protected from use or disclosure. If you are not the intended recipient, you are strictly prohibited from reviewing, disclosing, copying using or disseminating any of this information or taking any action in reliance on or regarding this information. If you have received this fax in error, please notify us immediately by telephone so that we can arrange for its return to [place]. Phone: [phone], Toll-Free: [phone], Fax: [phone] Page: 1 of 2 Call Id: [id number] [place] [facility] - Client TELEPHONE ADVICE RECORD [facility] Patient Name: [name] Gender: Male DOB: 10/27/1945 Age: 76 Y 11 M 19 D Return Phone Number: [phone] (Primary) Address: City/ State/ Zip: [place]  [address] Client [place] [facility] - Client Client Site [place] Provider Renford Dills - MD Contact Type Call Who Is Calling Patient / Member / Family / Caregiver Call Type Triage / Clinical Relationship To Patient Self Return Phone Number [phone] (Primary) Chief Complaint BLOOD PRESSURE HIGH - Systolic (top number) 650 or greater Reason for Call Symptomatic / Request for [place] states he saw Dr. Damita Dunnings today. Blood pressure was high and was told if it is 140/90 call the doctor. Blood pressure is 200/125. Patient was otld by pharmacy to take another blood pressure pill (50 mg) Translation No Nurse Assessment Nurse: Toy Cookey, RN, Stanton Kidney Date/Time Eilene Ghazi Time): 04/24/2021 7:16:21 PM Confirm and document reason for call. If symptomatic, describe symptoms. ---Caller states he saw Dr. Damita Dunnings today. 134/98 in office. Blood  pressure was high and was told if it is 140/90 call the doctor. Blood pressure is 200/125 at pharmacy. Patient was told by pharmacy to take another blood pressure pill (50 mg). Took Losartan at 0630 and another one at 1910. No CP/SOB. Mild dizziness. 192/132 was a recheck during triage. Patient had his shirt on under the BP cuff. I recommended taking BP with out shirt interference. Recheck at that point was 184/96 pulse of 67. Does the patient have any new or worsening symptoms? ---Yes Will a triage be completed? ---Yes Related visit to physician within the last 2 weeks? ---No Does the PT have any chronic conditions? (i.e. diabetes, asthma, this includes High risk factors for pregnancy, etc.) ---Yes List chronic conditions. 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 Is this a behavioral health or substance abuse call? ---No PLEASE NOTE: All timestamps contained within this report are represented as [place] Standard Time. CONFIDENTIALTY NOTICE: This fax transmission is intended only for the addressee. It contains information that is legally privileged, confidential or otherwise protected from use or disclosure. If you are not the intended recipient, you are strictly prohibited from reviewing, disclosing, copying using or disseminating any of this information or taking any action in reliance on or regarding this information. If you have received this fax in error, please notify us immediately by telephone so that we can arrange for its return to [place]. Phone: [phone], Toll-Free: [phone], Fax: [phone] Page: 2 of 2 Call Id: [id number] Guidelines Guideline Title Affirmed Question Affirmed Notes Nurse Date/Time Eilene Ghazi Time) Blood Pressure - High Systolic BP >= 701 OR Diastolic >= 779 Bonnetta Barry 04/24/2021 7:17:50 PM Disp. Time Eilene Ghazi Time) Disposition Final User 04/24/2021 7:15:04 PM Send to Urgent Devonne Doughty 04/24/2021 7:28:56 PM See PCP  within 24 Hours Yes Toy Cookey, RN,  Nemiah Commander Disagree/Comply Comply Caller Understands Yes PreDisposition InappropriateToAsk Care Advice Given Per Guideline SEE PCP WITHIN 24 HOURS: * IF OFFICE WILL BE CLOSED: You need to be seen within the next 24 hours. A clinic or an urgent care center is often a good source of care if your doctor's office is closed or you can't get an appointment. CALL BACK IF: * Weakness or numbness of the face, arm or leg on one side of the body occurs * Difficulty walking, difficulty talking, or severe headache occurs * Chest pain or difficulty breathing occurs * You become worse Referrals GO TO FACILITY UNDECIDED

## 2021-04-27 NOTE — Telephone Encounter (Signed)
Patient called office states that after ED visit that blood pressure as been all over the place. He wanted to make appointment to be seen I have put him in the 3 pm in office today. He will bring log of readings after Ed and his cuff.

## 2021-04-27 NOTE — Progress Notes (Signed)
This visit occurred during the SARS-CoV-2 public health emergency.  Safety protocols were in place, including screening questions prior to the visit, additional usage of staff PPE, and extensive cleaning of exam room while observing appropriate contact time as indicated for disinfecting solutions.  He has sig elevated BP out of clinic, on mult cuffs.  No clear trigger. Minimal dizziness now, slowly getting better.  He felt better when his BP was 158/89, ie when it went down, yesterday AM. When BP is higher "I feel bad all over."  No CP.  He quit drinking soda.  He isn't adding salt.  He has some occ canned foods but that is at baseline.    Meds, vitals, and allergies reviewed.   ROS: Per HPI unless specifically indicated in ROS section   GEN: nad, alert and oriented HEENT: ncat NECK: supple w/o LA CV: rrr.  PULM: ctab, no inc wob ABD: soft, +bs EXT: no edema SKIN: no acute rash  Recheck BP here in the clinic 152/90 on our cuff, 156/95 on his cuff.

## 2021-04-29 NOTE — Assessment & Plan Note (Signed)
Discussed options.  His blood pressure improved on recheck here in the clinic but it was still not at goal Take 1 losartan 50mg  a day.  If BP is above 140/90, then take an extra 1/2 tab. I want him to let me know how that goes.   It appears to be a separate issue but if he is not getting better from vertigo, then he can let me know.    I asked him to update me about his BP and losartan use in the next week or so.  He can schedule a visit on the way out for next week.

## 2021-05-04 ENCOUNTER — Other Ambulatory Visit: Payer: Self-pay

## 2021-05-04 ENCOUNTER — Ambulatory Visit (INDEPENDENT_AMBULATORY_CARE_PROVIDER_SITE_OTHER): Payer: Medicare Other | Admitting: Family Medicine

## 2021-05-04 ENCOUNTER — Encounter: Payer: Self-pay | Admitting: Family Medicine

## 2021-05-04 VITALS — BP 144/90 | HR 64 | Temp 98.2°F | Ht 71.0 in | Wt 194.0 lb

## 2021-05-04 DIAGNOSIS — R42 Dizziness and giddiness: Secondary | ICD-10-CM | POA: Diagnosis not present

## 2021-05-04 DIAGNOSIS — I10 Essential (primary) hypertension: Secondary | ICD-10-CM | POA: Diagnosis not present

## 2021-05-04 LAB — BASIC METABOLIC PANEL
BUN: 17 mg/dL (ref 6–23)
CO2: 29 mEq/L (ref 19–32)
Calcium: 9.7 mg/dL (ref 8.4–10.5)
Chloride: 105 mEq/L (ref 96–112)
Creatinine, Ser: 1.01 mg/dL (ref 0.40–1.50)
GFR: 73.53 mL/min (ref >60.00)
Glucose, Bld: 94 mg/dL (ref 70–99)
Potassium: 4.4 mEq/L (ref 3.5–5.1)
Sodium: 141 mEq/L (ref 135–145)

## 2021-05-04 MED ORDER — LOSARTAN POTASSIUM 100 MG PO TABS: 100.0000 mg | ORAL_TABLET | Freq: Every day | ORAL | 3 refills | Status: DC

## 2021-05-04 NOTE — Patient Instructions (Addendum)
Go to the lab on the way out.   If you have mychart we'll likely use that to update you.     Increase losartan to 100mg  a day.   Plan on recheck in about 2 weeks. Please schedule on the way out.  Try the bedside exercise for vertigo.   If not better by the end of the week, then let me know.  We can consider CT head or ENT referral at that point.    If you get lightheaded on the higher dose of BP medicine, then cut back to 75mg  a day.   Take care.  Glad to see you.

## 2021-05-04 NOTE — Progress Notes (Signed)
This visit occurred during the SARS-CoV-2 public health emergency.  Safety protocols were in place, including screening questions prior to the visit, additional usage of staff PPE, and extensive cleaning of exam room while observing appropriate contact time as indicated for disinfecting solutions.  Hypertension:    Using medication without problems or lightheadedness: yes, no presyncope.   Chest pain with exertion:no Edema:no Short of breath:no Average home BPs: similar BP checks at home to reading here.   Has been taking 75mg  losartan daily in the meantime.    He is still having a little vertigo, taking meclizine in the meantime daily.  It is better per patient report.  No ADE on med except for mild fatigue on med but that may predate med use.  Sx going on intermittently for about 3 weeks.  He has reproducible sx, with specific position changes, ie rolling over in the bed (especially if done quickly but less likely with slow movement).  He hasn't started bedside exercise yet.  Vertigo predates the BP elevation.    Meds, vitals, and allergies reviewed.   PMH and SH reviewed  ROS: Per HPI unless specifically indicated in ROS section   GEN: nad, alert and oriented HEENT: ncat NECK: supple w/o LA CV: rrr. PULM: ctab, no inc wob ABD: soft, +bs EXT: no edema SKIN: well perfused.  CN 2-12 wnl B, S/S wnl B except for L facial numbness at baseline.

## 2021-05-06 NOTE — Assessment & Plan Note (Signed)
See notes on labs.Increase losartan to 100mg  a day.   If lightheaded on the higher dose of BP medicine, then cut back to 75mg  a day.  Plan on recheck in about 2 weeks. See after visit summary.

## 2021-05-06 NOTE — Assessment & Plan Note (Signed)
Still okay for outpatient follow-up I asked him to try the bedside exercise for vertigo.  If not better by the end of the week, then let me know.  We can consider CT head or ENT referral at that point.

## 2021-05-19 ENCOUNTER — Encounter: Payer: Self-pay | Admitting: Family Medicine

## 2021-05-19 ENCOUNTER — Ambulatory Visit (INDEPENDENT_AMBULATORY_CARE_PROVIDER_SITE_OTHER): Payer: Medicare Other | Admitting: Family Medicine

## 2021-05-19 ENCOUNTER — Other Ambulatory Visit: Payer: Self-pay

## 2021-05-19 DIAGNOSIS — I10 Essential (primary) hypertension: Secondary | ICD-10-CM | POA: Diagnosis not present

## 2021-05-19 MED ORDER — DOXAZOSIN MESYLATE 1 MG PO TABS: 1.0000 mg | ORAL_TABLET | Freq: Every day | ORAL | 3 refills | Status: DC

## 2021-05-19 NOTE — Patient Instructions (Signed)
I would continue as is with your previous meds but add on doxazosin 1mg  a day.  See if your BP is lower and your stream is better.  Update me as needed.  If lightheaded cut the doxazosin back to 1/2 tab a day.   Take care.  Glad to see you. Plan on recheck in about 2 weeks.

## 2021-05-19 NOTE — Progress Notes (Signed)
This visit occurred during the SARS-CoV-2 public health emergency.  Safety protocols were in place, including screening questions prior to the visit, additional usage of staff PPE, and extensive cleaning of exam room while observing appropriate contact time as indicated for disinfecting solutions.  Hypertension:    Using medication without problems or lightheadedness: yes Chest pain with exertion: no Edema:no Short of breath:no Average home BPs: usually 130s- 140s, occ higher/usually 80s  He has slower stream at baseline.    Vertigo is better in the meantime and no recent meclizine use.  Occ rare sx with sudden movement, ie rolling over in the bed and better than prev.    Meds, vitals, and allergies reviewed.  PMH and SH reviewed  ROS: Per HPI unless specifically indicated in ROS section   GEN: nad, alert and oriented HEENT: ncat NECK: supple w/o LA CV: rrr. PULM: ctab, no inc wob ABD: soft, +bs EXT: no edema SKIN: no acute rash

## 2021-05-23 NOTE — Assessment & Plan Note (Signed)
Discussed treating his blood pressure and urinary symptoms concurrently.  Most recent PSA is not elevated Continue losartan and add on doxazosin 1mg  a day.    If lightheaded cut the doxazosin back to 1/2 tab a day.   Plan on recheck in about 2 weeks.

## 2021-05-25 ENCOUNTER — Telehealth: Payer: Self-pay | Admitting: Family Medicine

## 2021-05-25 NOTE — Chronic Care Management (AMB) (Signed)
°  Chronic Care Management   Note  05/25/2021 Name: [name] MRN: [id number] DOB: Feb 17, 1946  [name] is a 76 y.o. year old male who is a primary care patient of Tonia Ghent, MD. I reached out to [name] by phone today in response to a referral sent by [name]'s PCP, Tonia Ghent, MD.   [name] was given information about Chronic Care Management services today including:  CCM service includes personalized support from designated clinical staff supervised by his physician, including individualized plan of care and coordination with other care providers 24/7 contact phone numbers for assistance for urgent and routine care needs. Service will only be billed when office clinical staff spend 20 minutes or more in a month to coordinate care. Only one practitioner may furnish and bill the service in a calendar month. The patient may stop CCM services at any time (effective at the end of the month) by phone call to the office staff.   Patient agreed to services and verbal consent obtained.   Follow up plan:  [name]

## 2021-06-11 ENCOUNTER — Ambulatory Visit: Payer: Medicare Other | Admitting: Family Medicine

## 2021-06-18 ENCOUNTER — Telehealth: Payer: Self-pay

## 2021-06-18 NOTE — Progress Notes (Signed)
? ? ?  Chronic Care Management ?Pharmacy Assistant  ? ?Name: [name]  MRN: [id number] DOB: February 10, 1946 ? ?Reason for Encounter: CCM (Initial Questions) ?  ?Recent office visits:  ?05/19/2021 - Elsie Stain, MD - Hypertension. Start: CARDURA 1 MG. Stop: FLEXERIL 10 MG  and ANTIVERT 25 MG. ?05/04/2021 Elsie Stain, MD - Hypertension - Change: Losartan 100 mg (dose increase) do to lightheadedness ?04/27/2021 - Elsie Stain, MD - Hypertension - Change: Losartan 50 mg - extra 1/2 tab if needed ?04/24/2021 - Elsie Stain, MD - Vertigo - Stop (not taking): (FLOMAX) 0.4 MG CAPS capsule. Stop (completed) (MEDROL DOSEPAK) 4 MG TBPK tablet ?02/12/2021 - Elsie Stain, MD - Annual Wellness Visit - Stable labs. Change: Taper Flomax due to previous TURP; restart if needed. ?01/12/2021 - Elsie Stain, MD - Dizziness - Start: ANTIVERT 25 MG tablet. Immunizations: [employer]. ? ?Recent consult visits:  ?None in last six months ? ?Hospital visits:  ?Medication Reconciliation was completed by comparing discharge summary, patient?s EMR and Pharmacy list, and upon discussion with patient. ? ?Admitted to the hospital on 04/25/2021 due to hypertension. Discharge date was 04/25/2021. Discharged from [facility].  ? ?No medication changes noted.  ? ?Medications that remain the same after Hospital Discharge:??  ?-All other medications will remain the same.   ? ?11/22/20222 - [facility] - Leg Pain Muscle Spasm - Discharged same day. ?Started: (FLEXERIL) 10 MG tablet and (MEDROL DOSEPAK) 4 MG TBPK tablet ? ?Medications: ?Outpatient Encounter Medications as of 06/18/2021  ?Medication Sig  ? aspirin EC 81 MG tablet Take 81 mg by mouth daily.   ? bimatoprost (LUMIGAN) 0.01 % SOLN Place 1 drop into both eyes at bedtime.  ? doxazosin (CARDURA) 1 MG tablet Take 1 tablet (1 mg total) by mouth daily.  ? losartan (COZAAR) 100 MG tablet Take 1 tablet (100 mg total) by mouth daily.  ? Omega-3 Fatty Acids (FISH OIL) 1000  MG CPDR Take 1,000 mg by mouth daily.  ? psyllium (METAMUCIL) 58.6 % packet Take 1 packet by mouth daily.  ? rivaroxaban (XARELTO) 20 MG TABS tablet Take 1 tablet (20 mg total) by mouth every evening.  ? ?No facility-administered encounter medications on file as of 06/18/2021.  ? ?No results found for: HGBA1C, MICROALBUR  ? ?BP Readings from Last 3 Encounters:  ?05/19/21 140/84  ?05/04/21 (!) 144/90  ?04/27/21 (!) 152/90  ? ? ?Patient contacted to confirm in office appointment with [name], Pharm D, on 06/24/2021 at 9:30. ? ?Do you have any problems getting your medications? No ? ?What is your top health concern you would like to discuss at your upcoming visit? Patient states he would like to discuss his blood pressure being high some days, which Dr. Damita Dunnings is aware of.  ? ?Have you seen any other providers since your last visit with PCP? No ? ?Star Rating Drugs:  ?Medication:  Last Fill: Day Supply ?Losartan 100 mg 05/04/2021 90 ? ?Care Gaps: ?Annual wellness visit in last year? Yes 02/12/2021 ?Most Recent BP reading:  140/89 on 05/19/2021 ? ?[name], CPP notified ? ?Marijean Niemann, RMA ?Clinical Pharmacy Assistant ?[phone] ? ? ? ? ?

## 2021-06-22 ENCOUNTER — Encounter: Payer: Self-pay | Admitting: Family Medicine

## 2021-06-22 ENCOUNTER — Ambulatory Visit (INDEPENDENT_AMBULATORY_CARE_PROVIDER_SITE_OTHER): Payer: Medicare Other | Admitting: Family Medicine

## 2021-06-22 ENCOUNTER — Other Ambulatory Visit: Payer: Self-pay

## 2021-06-22 DIAGNOSIS — I10 Essential (primary) hypertension: Secondary | ICD-10-CM | POA: Diagnosis not present

## 2021-06-22 NOTE — Progress Notes (Signed)
This visit occurred during the SARS-CoV-2 public health emergency.  Safety protocols were in place, including screening questions prior to the visit, additional usage of staff PPE, and extensive cleaning of exam room while observing appropriate contact time as indicated for disinfecting solutions. ? ?Hypertension:    ?Using medication without problems or lightheadedness: yes ?Chest pain with exertion:no ?Edema:no ?Short of breath:no ?He had lower BP after working in the yard today.  Not lightheaded.   ?Urine stream is okay, maybe some better after starting doxazosin 1 mg.  Also on losartan '100mg'$  a day.  ?Dizziness is better in the meantime.  Not totally resolved but better now.  Not orthostatic on standing here in clinic.   ?Most recent blood pressures at home have usually been 183-358I systolic over 51G-98M diastolic, occasionally with higher systolic readings noted. ? ?Meds, vitals, and allergies reviewed.  ?ROS: Per HPI unless specifically indicated in ROS section  ? ?GEN: nad, alert and oriented ?HEENT ncat ?NECK: supple w/o LA ?CV: rrr. ?PULM: ctab, no inc wob ?ABD: soft, +bs ?EXT: no edema ?SKIN: well perfused.  ? ?

## 2021-06-22 NOTE — Patient Instructions (Signed)
I would keep going as is.  If needed (if lightheaded) then cut the doxazosin back to 1/2 tab a day.   ? ?If needed, if BP is persistently >150/>90 then increase to 1.5-2 tabs a day.  ? ?I wouldn't change the losartan.  ? ?Take care.  Glad to see you. ?

## 2021-06-24 ENCOUNTER — Ambulatory Visit (INDEPENDENT_AMBULATORY_CARE_PROVIDER_SITE_OTHER): Payer: Medicare Other | Admitting: Pharmacist

## 2021-06-24 ENCOUNTER — Other Ambulatory Visit: Payer: Self-pay

## 2021-06-24 DIAGNOSIS — D6851 Activated protein C resistance: Secondary | ICD-10-CM

## 2021-06-24 DIAGNOSIS — Z86718 Personal history of other venous thrombosis and embolism: Secondary | ICD-10-CM

## 2021-06-24 DIAGNOSIS — I10 Essential (primary) hypertension: Secondary | ICD-10-CM

## 2021-06-24 DIAGNOSIS — E7800 Pure hypercholesterolemia, unspecified: Secondary | ICD-10-CM

## 2021-06-24 NOTE — Patient Instructions (Addendum)
Visit Information ? ?Phone number for Pharmacist: [phone] ? ?Thank you for meeting with me to discuss your medications! I look forward to working with you to achieve your health care goals. Below is a summary of what we talked about during the visit: ? ? Goals Addressed   ? ?  ?  ?  ?  ? This Visit's Progress  ?  Manage My Medicine     ?  Timeframe:  Long-Range Goal ?Priority:  Medium ?Start Date:     06/24/21                        ?Expected End Date:     06/25/22                 ? ?Follow Up Date Oct 2023 ?  ?- call for medicine refill 2 or 3 days before it runs out ?- call if I am sick and can't take my medicine ?- keep a list of all the medicines I take; vitamins and herbals too  ?  ?Why is this important?   ?These steps will help you keep on track with your medicines. ?  ?Notes:  ?  ? ?  ? ? ?Care Plan : [place]  ?Updates made by Charlton Haws, RPH since 06/24/2021 12:00 AM  ?  ? ?Problem: Hypertension, Hyperlipidemia, and Hypercoaguable disease   ?Priority: High  ?  ? ?Long-Range Goal: Disease mgmt   ?Start Date: 06/24/2021  ?Expected End Date: 06/25/2022  ?This Visit's Progress: On track  ?Priority: High  ?Note:   ?Current Barriers:  ?Struggles to independently afford treatment regimen ? ?Pharmacist Clinical Goal(s):  ?Patient will verbalize ability to afford treatment regimen through collaboration with PharmD and provider.  ? ?Interventions: ?1:1 collaboration with Tonia Ghent, MD regarding development and update of comprehensive plan of care as evidenced by provider attestation and co-signature ?Inter-disciplinary care team collaboration (see longitudinal plan of care) ?Comprehensive medication review performed; medication list updated in electronic medical record ? ?Hypertension (BP goal <140/90) ?-Controlled - per pt report, home BP at goal ?-Current home BP readings: 130-140/80s; denies low BP ?-Current treatment: ?Losartan 100 mg daily - Appropriate, Effective, Safe,  Accessible ?Doxazosin 1 mg daily -Appropriate, Effective, Safe, Accessible ?-Medications previously tried: lisinopril (cough), timolol (bradycardia) ?-Educated on BP goals and benefits of medications for prevention of heart attack, stroke and kidney damage; Importance of home blood pressure monitoring; ?-Counseled to monitor BP at home 1-2x weekly ?-Recommended to continue current medication ? ?Hyperlipidemia / ASCVD risk asessment (LDL goal < 100) ?-Not ideally controlled - 10-year ASCVD risk is 25% (high-risk); pt is not on a statin.  ?-Current treatment: ?Aspirin 81 mg daily - Appropriate, Effective, Safe, Accessible ?OTC fish oil - Query appropriate ?-Medications previously tried: n/a  ?-Educated on Cholesterol goals; Benefits of statin for ASCVD risk reduction; ?-Discussed bleeding risk associated with fish oil, aspirin and Xarelto. Given his TRIG are well controlled, he is likely gleaning little benefit from fish oil. Advised to stop fish oil due to risks > benefit ?-Consider trial of rosuvastatin 5 mg; pt declined today ? 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 2nd clot 11/2014. Indefinite anticoagulation. ?-Current treatment  ?Xarelto 20 mg daily - Appropriate, Effective, Safe, Query Accessible ?-Medications previously tried: n/a ?-Provided patient  with contact info for [place] assistance during donut hole ($240 per 3 months)  ?-Recommended to continue current medication ? ?Health Maintenance ?-Vaccine gaps: covid booster ?-Current therapy:  ?Bimatoprost 0.01% eye drops ?Metamucil ?-Recommended to continue current medication ? ?Patient Goals/Self-Care Activities ?Patient will:  ?- take medications as prescribed as evidenced by patient report and record review ?focus on medication adherence by routine ?check blood  pressure 1-2x weekly, document, and provide at future appointments ?collaborate with provider on medication access solutions (Xarelto) ?-Stop fish oil ? ?  ?  ? ?[name] was given information about Chronic Care Management services today including:  ?CCM service includes personalized support from designated clinical staff supervised by his physician, including individualized plan of care and coordination with other care providers ?24/7 contact phone numbers for assistance for urgent and routine care needs. ?Standard insurance, coinsurance, copays and deductibles apply for chronic care management only during months in which we provide at least 20 minutes of these services. Most insurances cover these services at 100%, however patients may be responsible for any copay, coinsurance and/or deductible if applicable. This service may help you avoid the need for more expensive face-to-face services. ?Only one practitioner may furnish and bill the service in a calendar month. ?The patient may stop CCM services at any time (effective at the end of the month) by phone call to the office staff. ? ?Patient agreed to services and verbal consent obtained.  ? ?Print copy of patient instructions, educational materials, and care plan provided in person.  ?Telephone follow up appointment with pharmacy team member scheduled for: 7 months ? ?[name], PharmD, BCACP ?Clinical Pharmacist ?[facility] ?[phone]  ?

## 2021-06-24 NOTE — Assessment & Plan Note (Signed)
Reasonable control.  No change in meds at this point.  If needed (if lightheaded) then cut the doxazosin back to 1/2 tab a day.   ?If needed, if BP is persistently >150/>90 then increase to 1.5-2 tabs of doxazosin a day.  ?I wouldn't change the losartan at this point.  ?He agrees to plan. ?

## 2021-06-24 NOTE — Progress Notes (Signed)
? ?Chronic Care Management ?Pharmacy Note ? ?06/24/2021 ?Name:  [name] MRN:  [id number] DOB:  Nov 21, 1945 ? ?Summary: CCM initial visit ?-Pt reports BP is at goal at home (avg 130-140/80s), he denies low BP ?-Pt reports Xarelto is $111 per 3 months which is reasonable. In donut hole cost increases to $300-400. He will not qualify for pt assistance due to income too high, but can enroll in [place] donut-hole discount program ?-10-year ASCVD risk is 25% (high risk), pt could benefit from a statin for primary prevention. Discussed benefits/risks, pt declined statin today. ?-Pt is taking Fish Oil in addition to Xarelto, Aspirin. Discussed additional bleeding risk with fish oil, overall lack of benefit given TRIG levels are normal. ? ?Recommendations/Changes made from today's visit: ?-Pt given info for [employer] - $85 Xarelto in donut hole (888-XARELTO) ?-Advised to stop fish oil as bleeding risks > benefit ? ?Plan: ?-Pharmacist follow up televisit scheduled for 7 months ? ? ?Subjective: ?[name] is an 76 y.o. year old male who is a primary patient of Damita Dunnings, Elveria Rising, MD.  The CCM team was consulted for assistance with disease management and care coordination needs.   ? ?Engaged with patient face to face for initial visit in response to provider referral for pharmacy case management and/or care coordination services.  ? ?Consent to Services:  ?The patient was given the following information about Chronic Care Management services today, agreed to services, and gave verbal consent: 1. CCM service includes personalized support from designated clinical staff supervised by the primary care provider, including individualized plan of care and coordination with other care providers 2. 24/7 contact phone numbers for assistance for urgent and routine care needs. 3. Service will only be billed when office clinical staff spend 20 minutes or more in a month to coordinate care. 4. Only one practitioner may furnish and  bill the service in a calendar month. 5.The patient may stop CCM services at any time (effective at the end of the month) by phone call to the office staff. 6. The patient will be responsible for cost sharing (co-pay) of up to 20% of the service fee (after annual deductible is met). Patient agreed to services and consent obtained. ? ?Patient Care Team: ?Tonia Ghent, MD as PCP - General ?Myrlene Broker, MD as Attending Physician (Urology) ?Owens Loffler, MD as Consulting Physician (Family Medicine) ?Carolan Clines, MD (Inactive) as Consulting Physician (Urology) ?Lequita Asal, MD (Inactive) as Referring Physician (Hematology and Oncology) ?Corrie Mckusick, DO as Consulting Physician (Interventional Radiology) ?Venia Carbon, MD as Referring Physician (Internal Medicine) ?Jodell Weitman, Cleaster [place], [facility] as Pharmacist (Pharmacist) ? ?Recent office visits: ?06/22/21 Dr Damita Dunnings OV: f/u HTN. If lightheaded cut Doxazosin to 1/2 tab. IF BP elevated, increase to 1.5-2 tab. Do not change losartan. ? ?05/19/2021 - Elsie Stain, MD - Hypertension. Start: CARDURA 1 MG. Stop: FLEXERIL 10 MG  and ANTIVERT 25 MG. ?05/04/2021 Elsie Stain, MD - Hypertension - Change: Losartan 100 mg (dose increase) do to lightheadedness ?04/27/2021 - Elsie Stain, MD - Hypertension - Change: Losartan 50 mg - extra 1/2 tab if needed ?04/24/2021 - Elsie Stain, MD - Vertigo - Stop (not taking): (FLOMAX) 0.4 MG CAPS capsule. Stop (completed) (MEDROL DOSEPAK) 4 MG TBPK tablet ?02/12/2021 - Elsie Stain, MD - Annual Wellness Visit - Stable labs. Change: Taper Flomax due to previous TURP; restart if needed. ?01/12/2021 - Elsie Stain, MD - Dizziness - Start: ANTIVERT 25 MG tablet. Immunizations: [employer]. ? ?  Recent consult visits: ?None in last six months ? ?Hospital visits: ?Medication Reconciliation was completed by comparing discharge summary, patient?s EMR and Pharmacy list, and upon discussion with patient. ?  ?Admitted  to the ED on 04/25/2021 due to hypertension. Discharge date was 04/25/2021. Discharged from [facility].  ?-BP improved in office ?  ?Medications that remain the same after Hospital Discharge:??  ?-All other medications will remain the same.   ?  ?11/22/20222 - [place] [facility] - Leg Pain Muscle Spasm - Discharged same day. ?Started: (FLEXERIL) 10 MG tablet and (MEDROL DOSEPAK) 4 MG TBPK tablet ? ? ?Objective: ? ?Lab Results  ?Component Value Date  ? CREATININE 1.01 05/04/2021  ? BUN 17 05/04/2021  ? GFR 73.53 05/04/2021  ? GFRNONAA >60 04/25/2021  ? GFRAA >60 10/09/2019  ? NA 141 05/04/2021  ? K 4.4 05/04/2021  ? CALCIUM 9.7 05/04/2021  ? CO2 29 05/04/2021  ? GLUCOSE 94 05/04/2021  ? ? ?Lab Results  ?Component Value Date/Time  ? GFR 73.53 05/04/2021 09:10 AM  ? GFR 71.93 02/12/2021 09:08 AM  ?  ?Last diabetic Eye exam: No results found for: HMDIABEYEEXA  ?Last diabetic Foot exam: No results found for: HMDIABFOOTEX  ? ?Lab Results  ?Component Value Date  ? CHOL 185 02/12/2021  ? HDL 57.20 02/12/2021  ? LDLCALC 116 (H) 02/12/2021  ? LDLDIRECT 140.1 01/27/2007  ? TRIG 60.0 02/12/2021  ? CHOLHDL 3 02/12/2021  ? ? ? ?  Latest Ref Rng & Units 02/12/2021  ?  9:08 AM 10/07/2020  ?  8:17 AM 04/07/2020  ?  7:45 AM  ?Hepatic Function  ?Total Protein 6.0 - 8.3 g/dL 6.6   7.1   6.6    ?Albumin 3.5 - 5.2 g/dL 4.5   4.6   4.2    ?AST 0 - 37 U/L _0 ?ALT 0 - 53 U/L 18   23   33    ?Alk Phosphatase 39 - 117 U/L 63   62   67    ?Total Bilirubin 0.2 - 1.2 mg/dL 0.7   1.0   0.5    ? ? ?Lab Results  ?Component Value Date/Time  ? TSH 1.47 02/07/2009 08:46 AM  ? TSH 1.21 02/05/2008 09:26 AM  ? ? ? ?  Latest Ref Rng & Units 04/25/2021  ?  7:36 AM 02/12/2021  ?  9:08 AM 10/07/2020  ?  8:17 AM  ?CBC  ?WBC 4.0 - 10.5 K/uL 5.4   4.1   4.1    ?Hemoglobin 13.0 - 17.0 g/dL 16.0   15.5   15.8    ?Hematocrit 39.0 - 52.0 % 47.2   47.1   46.4    ?Platelets 150 - 400 K/uL 183   172.0   176    ? ? ?Lab Results  ?Component Value  Date/Time  ? VD25OH 39 02/07/2009 09:10 PM  ? ? ?Clinical ASCVD: No  ?The 10-year ASCVD risk score (Arnett DK, et al., 2019) is: 25.6% ?  Values used to calculate the score: ?    Age: 52 years ?    Sex: Male ?    Is Non-Hispanic African American: No ?    Diabetic: No ?    Tobacco smoker: No ?    Systolic Blood Pressure: 035 mmHg ?    Is BP treated: Yes ?    HDL Cholesterol: 57.2 mg/dL ?    Total Cholesterol: 185 mg/dL   ? ? ?  04/24/2021  ? 11:39 AM 04/07/2020  ? 12:17 PM 02/11/2020  ?  8:22 AM  ?Depression screen PHQ 2/9  ?Decreased Interest 0 0 0  ?Down, Depressed, Hopeless 0 0 0  ?PHQ - 2 Score 0 0 0  ?  ? ?Social History  ? ?Tobacco Use  ?Smoking Status Former  ?Smokeless Tobacco Former  ? ?BP Readings from Last 3 Encounters:  ?06/22/21 130/84  ?05/19/21 140/84  ?05/04/21 (!) 144/90  ? ?Pulse Readings from Last 3 Encounters:  ?06/22/21 62  ?05/19/21 64  ?05/04/21 64  ? ?Wt Readings from Last 3 Encounters:  ?06/22/21 195 lb (88.5 kg)  ?05/19/21 196 lb (88.9 kg)  ?05/04/21 194 lb (88 kg)  ? ?BMI Readings from Last 3 Encounters:  ?06/22/21 27.20 kg/m?  ?05/19/21 27.34 kg/m?  ?05/04/21 27.06 kg/m?  ? ? ?Assessment/Interventions: Review of patient past medical history, allergies, medications, health status, including review of consultants reports, laboratory and other test data, was performed as part of comprehensive evaluation and provision of chronic care management services.  ? ?SDOH:  (Social Determinants of Health) assessments and interventions performed: Yes ?SDOH Interventions   ? ?Flowsheet Row Most Recent Value  ?SDOH Interventions   ?Financial Strain Interventions Other (Comment)  [Xarelto - Consulting civil engineer provided]  ?Transportation Interventions Intervention Not Indicated  ? ?  ? ?SDOH Screenings  ? ?Alcohol Screen: Not on file  ?Depression (PHQ2-9): Low Risk   ? PHQ-2 Score: 0  ?Financial Resource Strain: Medium Risk  ? Difficulty of Paying Living Expenses: Somewhat hard  ?Food Insecurity: Not on file   ?Housing: Not on file  ?Physical Activity: Not on file  ?Social Connections: Not on file  ?Stress: Not on file  ?Tobacco Use: Medium Risk  ? Smoking Tobacco Use: Former  ? Smokeless Tobacco Use: Forme

## 2021-07-03 DIAGNOSIS — I10 Essential (primary) hypertension: Secondary | ICD-10-CM | POA: Diagnosis not present

## 2021-07-03 DIAGNOSIS — E785 Hyperlipidemia, unspecified: Secondary | ICD-10-CM | POA: Diagnosis not present

## 2021-07-11 ENCOUNTER — Emergency Department (HOSPITAL_COMMUNITY)
Admission: EM | Admit: 2021-07-11 | Discharge: 2021-07-11 | Disposition: A | Discharge status: Home / Self Care | Payer: Medicare Other | Attending: Emergency Medicine | Admitting: Emergency Medicine

## 2021-07-11 ENCOUNTER — Encounter (HOSPITAL_COMMUNITY): Payer: Self-pay | Admitting: Emergency Medicine

## 2021-07-11 ENCOUNTER — Other Ambulatory Visit: Payer: Self-pay

## 2021-07-11 DIAGNOSIS — Z7982 Long term (current) use of aspirin: Secondary | ICD-10-CM | POA: Insufficient documentation

## 2021-07-11 DIAGNOSIS — I10 Essential (primary) hypertension: Secondary | ICD-10-CM

## 2021-07-11 DIAGNOSIS — R42 Dizziness and giddiness: Secondary | ICD-10-CM | POA: Diagnosis not present

## 2021-07-11 DIAGNOSIS — Z7901 Long term (current) use of anticoagulants: Secondary | ICD-10-CM | POA: Diagnosis not present

## 2021-07-11 DIAGNOSIS — Z79899 Other long term (current) drug therapy: Secondary | ICD-10-CM | POA: Insufficient documentation

## 2021-07-11 LAB — CBC
HCT: 44.7 % (ref 39.0–52.0)
Hemoglobin: 15 g/dL (ref 13.0–17.0)
MCH: 29.8 pg (ref 26.0–34.0)
MCHC: 33.6 g/dL (ref 30.0–36.0)
MCV: 88.7 fL (ref 80.0–100.0)
Platelets: 169 10*3/uL (ref 150–400)
RBC: 5.04 MIL/uL (ref 4.22–5.81)
RDW: 12.1 % (ref 11.5–15.5)
WBC: 4.1 10*3/uL (ref 4.0–10.5)
nRBC: 0 % (ref 0.0–0.2)

## 2021-07-11 LAB — URINALYSIS, ROUTINE W REFLEX MICROSCOPIC
Bilirubin Urine: NEGATIVE
Glucose, UA: NEGATIVE mg/dL
Hgb urine dipstick: NEGATIVE
Ketones, ur: NEGATIVE mg/dL
Leukocytes,Ua: NEGATIVE
Nitrite: NEGATIVE
Protein, ur: NEGATIVE mg/dL
Specific Gravity, Urine: 1.011 (ref 1.005–1.030)
pH: 6 (ref 5.0–8.0)

## 2021-07-11 LAB — BASIC METABOLIC PANEL
Anion gap: 6 (ref 5–15)
BUN: 14 mg/dL (ref 8–23)
CO2: 27 mmol/L (ref 22–32)
Calcium: 8.9 mg/dL (ref 8.9–10.3)
Chloride: 109 mmol/L (ref 98–111)
Creatinine, Ser: 0.9 mg/dL (ref 0.61–1.24)
GFR, Estimated: >60 mL/min (ref >60)
Glucose, Bld: 106 mg/dL — ABNORMAL HIGH (ref 70–99)
Potassium: 4.1 mmol/L (ref 3.5–5.1)
Sodium: 142 mmol/L (ref 135–145)

## 2021-07-11 NOTE — ED Provider Notes (Signed)
?[facility] ?Provider Note ? ? ?CSN: [id number] ?Arrival date & time: 07/11/21  1714 ? ?  ? ?History ? ?Chief Complaint  ?Patient presents with  ? Hypertension  ? ? ?[name] is a 76 y.o. male. ? ?76 year old male presents with increased blood pressure at home.  He has not had any associated symptoms of chest pain or shortness of breath.  States that his doctor did increase his antihypertensive about a month ago.  Has been compliant with his medication.  Blood pressure at home was 160/100.  No headache.  No treatment use prior to arrival ? ? ?  ? ?Home Medications ?Prior to Admission medications   ?Medication Sig Start Date End Date Taking? Authorizing Provider  ?aspirin EC 81 MG tablet Take 81 mg by mouth daily.     [provider]  ?bimatoprost (LUMIGAN) 0.01 % SOLN Place 1 drop into both eyes at bedtime.    [provider]  ?doxazosin (CARDURA) 1 MG tablet Take 1 tablet (1 mg total) by mouth daily. 05/19/21   Tonia Ghent, MD  ?losartan (COZAAR) 100 MG tablet Take 1 tablet (100 mg total) by mouth daily. 05/04/21   Tonia Ghent, MD  ?psyllium (METAMUCIL) 58.6 % packet Take 1 packet by mouth daily.    [provider]  ?rivaroxaban (XARELTO) 20 MG TABS tablet Take 1 tablet (20 mg total) by mouth every evening. 02/12/21   Tonia Ghent, MD  ?   ? ?Allergies    ?Beta adrenergic blockers, Lisinopril, and Codeine   ? ?Review of Systems   ?Review of Systems  ?All other systems reviewed and are negative. ? ?Physical Exam ?Updated Vital Signs ?BP (!) 169/93 (BP Location: Right Arm)   Pulse (!) 59   Temp 98.6 ?F (37 ?C) (Oral)   Resp 16   Ht 1.829 m (6')   Wt 88.5 kg   SpO2 100%   BMI 26.45 kg/m?  ?Physical Exam ?Vitals and nursing note reviewed.  ?Constitutional:   ?   General: He is not in acute distress. ?   Appearance: Normal appearance. He is well-developed. He is not toxic-appearing.  ?HENT:  ?   Head: Normocephalic and atraumatic.   ?Eyes:  ?   General: Lids are normal.  ?   Conjunctiva/sclera: Conjunctivae normal.  ?   Pupils: Pupils are equal, round, and reactive to light.  ?Neck:  ?   Thyroid: No thyroid mass.  ?   Trachea: No tracheal deviation.  ?Cardiovascular:  ?   Rate and Rhythm: Normal rate and regular rhythm.  ?   Heart sounds: Normal heart sounds. No murmur heard. ?  No gallop.  ?Pulmonary:  ?   Effort: Pulmonary effort is normal. No respiratory distress.  ?   Breath sounds: Normal breath sounds. No stridor. No decreased breath sounds, wheezing, rhonchi or rales.  ?Abdominal:  ?   General: There is no distension.  ?   Palpations: Abdomen is soft.  ?   Tenderness: There is no abdominal tenderness. There is no rebound.  ?Musculoskeletal:     ?   General: No tenderness. Normal range of motion.  ?   Cervical back: Normal range of motion and neck supple.  ?Skin: ?   General: Skin is warm and dry.  ?   Findings: No abrasion or rash.  ?Neurological:  ?   Mental Status: He is alert and oriented to person, place, and time. Mental status is at baseline.  ?  GCS: GCS eye subscore is 4. GCS verbal subscore is 5. GCS motor subscore is 6.  ?   Cranial Nerves: No cranial nerve deficit.  ?   Sensory: No sensory deficit.  ?   Motor: Motor function is intact.  ?Psychiatric:     ?   Attention and Perception: Attention normal.     ?   Speech: Speech normal.     ?   Behavior: Behavior normal.  ? ? ?ED Results / Procedures / Treatments   ?Labs ?(all labs ordered are listed, but only abnormal results are displayed) ?Labs Reviewed  ?CBC  ?BASIC METABOLIC PANEL  ?URINALYSIS, ROUTINE W REFLEX MICROSCOPIC  ? ? ?EKG ?EKG Interpretation ? ?Date/Time:  Saturday July 11 2021 17:42:37 EDT ?Ventricular Rate:  61 ?PR Interval:  174 ?QRS Duration: 102 ?QT Interval:  414 ?QTC Calculation: 416 ?R Axis:   84 ?Text Interpretation: Normal sinus rhythm Possible Anterior infarct , age undetermined ST & T wave abnormality, consider inferior ischemia Abnormal ECG When  compared with ECG of 25-Apr-2021 07:34, PREVIOUS ECG IS PRESENT Confirmed by Lacretia Leigh ([id number]) on 07/11/2021 6:10:23 PM ? ?Radiology ?No results found. ? ?Procedures ?Procedures  ? ? ?Medications Ordered in ED ?Medications - No data to display ? ?ED Course/ Medical Decision Making/ A&P ?  ?                        ?Medical Decision Making ?Amount and/or Complexity of Data Reviewed ?Labs: ordered. ? ? ?Patient's EKG per my interpretation shows no signs of acute ischemic changes.  He is in sinus rhythm.  Blood pressure noted here and gradually has decreased.  Patient states that he takes losartan for his blood pressure but took an extra dose of Cardura today when his blood pressure at home was elevated.  It is seem to work because his blood pressure has decreased.  Laboratory studies are reassuring assuring here.  Patient to follow-up with his doctor next week ? ? ? ? ? ? ? ?Final Clinical Impression(s) / ED Diagnoses ?Final diagnoses:  ?None  ? ? ?Rx / DC Orders ?ED Discharge Orders   ? ? None  ? ?  ? ? ?  ?Lacretia Leigh, MD ?07/11/21 1925 ? ?

## 2021-07-11 NOTE — ED Triage Notes (Signed)
Pt arrives POV with concerns for high BP. Self checked at home with monitor and it was 188/101. Pt on losartan for BP- no recent changes. No complaints of headaches, visual changes, denies chest pain, shortness of breath. Pt states intermittent dizziness since February when he was first diagnoses. No dizziness today however. NAD Aox4.  ?

## 2021-07-13 ENCOUNTER — Telehealth: Payer: Self-pay

## 2021-07-13 MED ORDER — DOXAZOSIN MESYLATE 1 MG PO TABS: 2.0000 mg | ORAL_TABLET | Freq: Every day | ORAL | 3 refills | Status: DC

## 2021-07-13 NOTE — Telephone Encounter (Signed)
I would inc doxazosin to '2mg'$  a day (okay to take 2 of the '1mg'$  tabs at one time), check his BP over the next few days and then get f/u here next week.  I think that would be reasonable.  Thanks . ?

## 2021-07-13 NOTE — Telephone Encounter (Signed)
lmtcb

## 2021-07-13 NOTE — Telephone Encounter (Signed)
Patent called was seen at ED for elevated blood pressure of 188/105. Came down to 160/78 before he left. He has been checked yesterday at home and it was 167/104. Denies any chest pain, headaches or SOB. Denies any symptoms at all. Would like to be seen for follow up. You do not have anything open at this time. Please advise. Let patient know we will call when we have more information from Dr. Damita Dunnings.  ?Taking: no missed doses of meds.  ?Losartan '100mg'$  ?Doxazosin '1mg'$   ?

## 2021-07-13 NOTE — Telephone Encounter (Signed)
Patient called back. Will make change in meds. And keep up with bp readings. You do not have anything open for follow up next week. Did you want to work in?  ?

## 2021-07-13 NOTE — Addendum Note (Signed)
Addended by: Tonia Ghent on: 07/13/2021 01:57 PM ? ? Modules accepted: Orders ? ?

## 2021-07-14 DIAGNOSIS — L814 Other melanin hyperpigmentation: Secondary | ICD-10-CM | POA: Diagnosis not present

## 2021-07-14 DIAGNOSIS — D485 Neoplasm of uncertain behavior of skin: Secondary | ICD-10-CM | POA: Diagnosis not present

## 2021-07-14 DIAGNOSIS — L308 Other specified dermatitis: Secondary | ICD-10-CM | POA: Diagnosis not present

## 2021-07-14 DIAGNOSIS — Z85828 Personal history of other malignant neoplasm of skin: Secondary | ICD-10-CM | POA: Diagnosis not present

## 2021-07-14 DIAGNOSIS — C44619 Basal cell carcinoma of skin of left upper limb, including shoulder: Secondary | ICD-10-CM | POA: Diagnosis not present

## 2021-07-14 DIAGNOSIS — L570 Actinic keratosis: Secondary | ICD-10-CM | POA: Diagnosis not present

## 2021-07-14 NOTE — Telephone Encounter (Signed)
Lmtcb to try to schedule appt  ?

## 2021-07-14 NOTE — Telephone Encounter (Signed)
Patient called back have put in for 4/18 at 930 ?

## 2021-07-14 NOTE — Telephone Encounter (Signed)
Thanks

## 2021-07-14 NOTE — Telephone Encounter (Signed)
Offer 9:30 on 07/21/21.  Let me know if scheduled then.  Thanks.  ?

## 2021-07-20 DIAGNOSIS — N401 Enlarged prostate with lower urinary tract symptoms: Secondary | ICD-10-CM | POA: Diagnosis not present

## 2021-07-21 ENCOUNTER — Encounter: Payer: Self-pay | Admitting: Family Medicine

## 2021-07-21 ENCOUNTER — Ambulatory Visit (INDEPENDENT_AMBULATORY_CARE_PROVIDER_SITE_OTHER): Payer: Medicare Other | Admitting: Family Medicine

## 2021-07-21 DIAGNOSIS — I10 Essential (primary) hypertension: Secondary | ICD-10-CM

## 2021-07-21 MED ORDER — DOXAZOSIN MESYLATE 1 MG PO TABS: 1.5000 mg | ORAL_TABLET | Freq: Every day | ORAL | Status: DC

## 2021-07-21 NOTE — Patient Instructions (Signed)
I would try taking 1.5 mg of doxazosin a day.  ?You could take it together or you could split it into '1mg'$  in the AM and 0.'5mg'$  in the evening.   ?Update me about your pressure in about 10 days.  ?If you are lightheaded, then cut back by 0.'5mg'$ .  ?Take care.  Glad to see you. ?

## 2021-07-21 NOTE — Progress Notes (Signed)
Hypertension:    ?Using medication without problems or lightheadedness:  he increased his doxazosin to '2mg'$  and BP got low, down to ~100/60.  He cut back to 1 tab a day.  Usually 130-150s/80-90s now.  D/w pt about options.   ?Chest pain with exertion: no ?Edema:no ?Short of breath:no ?Dizziness is better in the meantime.   ?Taking doxazosin '1mg'$  and losartan '100mg'$  a day.   ? ?We talked about staying off fish oil for now and rechecking lipids later on.   ? ?Meds, vitals, and allergies reviewed.  ?PMH and SH reviewed ? ?ROS: Per HPI unless specifically indicated in ROS section  ? ?GEN: nad, alert and oriented ?HEENT: ncat ?NECK: supple w/o LA ?CV: rrr. ?PULM: ctab, no inc wob ?ABD: soft, +bs ?EXT: no edema ?SKIN: well perfused.   ?

## 2021-07-23 NOTE — Assessment & Plan Note (Signed)
Dizziness is better in the meantime.   ?Taking doxazosin '1mg'$  and losartan '100mg'$  a day.   ? ?I would try taking 1.5 mg of doxazosin a day.  ?He could take it together or he could split it into '1mg'$  in the AM and 0.'5mg'$  in the evening.   ?He can update me about his pressure in about 10 days.  ?If lightheaded, then cut dose back by 0.'5mg'$ .  ?

## 2021-07-27 DIAGNOSIS — R3914 Feeling of incomplete bladder emptying: Secondary | ICD-10-CM | POA: Diagnosis not present

## 2021-07-27 DIAGNOSIS — N401 Enlarged prostate with lower urinary tract symptoms: Secondary | ICD-10-CM | POA: Diagnosis not present

## 2021-07-27 DIAGNOSIS — N281 Cyst of kidney, acquired: Secondary | ICD-10-CM | POA: Diagnosis not present

## 2021-07-27 DIAGNOSIS — N323 Diverticulum of bladder: Secondary | ICD-10-CM | POA: Diagnosis not present

## 2021-07-31 ENCOUNTER — Telehealth: Payer: Self-pay | Admitting: Family Medicine

## 2021-07-31 MED ORDER — CEPHALEXIN 500 MG PO CAPS: 500.0000 mg | ORAL_CAPSULE | Freq: Two times a day (BID) | ORAL | 0 refills | Status: DC

## 2021-07-31 NOTE — Telephone Encounter (Signed)
Rx sent for keflex.  Take if burning with urination.  And would restart self cath per urology noted.  If worse over the weekend in spite of that, then needs UC eval.  Thanks.  ?

## 2021-07-31 NOTE — Telephone Encounter (Signed)
Spoke with patient and discussed below with him. Patient verbalized understanding. ?

## 2021-07-31 NOTE — Addendum Note (Signed)
Addended by: Tonia Ghent on: 07/31/2021 04:59 PM ? ? Modules accepted: Orders ? ?

## 2021-07-31 NOTE — Telephone Encounter (Signed)
Patient called back to follow up on message. Advised that for abx office visit is needed. Patient assured me that Dr. Damita Dunnings is aware of his history and he will not need visit.  ?

## 2021-07-31 NOTE — Telephone Encounter (Signed)
Pt called to schedule an appointment today with Dr. Damita Dunnings but had one coming up on 08/07/2021. He said it hurts when he urinates, he went to the Urologist on Monday or Tuesday. Would like to speak with the nurse about maybe getting on some medication for it.  ? ?Callback Number: [id number] ?

## 2021-08-03 DIAGNOSIS — H2513 Age-related nuclear cataract, bilateral: Secondary | ICD-10-CM | POA: Diagnosis not present

## 2021-08-03 DIAGNOSIS — H40011 Open angle with borderline findings, low risk, right eye: Secondary | ICD-10-CM | POA: Diagnosis not present

## 2021-08-03 DIAGNOSIS — H43811 Vitreous degeneration, right eye: Secondary | ICD-10-CM | POA: Diagnosis not present

## 2021-08-03 DIAGNOSIS — H401121 Primary open-angle glaucoma, left eye, mild stage: Secondary | ICD-10-CM | POA: Diagnosis not present

## 2021-08-07 ENCOUNTER — Encounter: Payer: Self-pay | Admitting: Family Medicine

## 2021-08-07 ENCOUNTER — Ambulatory Visit (INDEPENDENT_AMBULATORY_CARE_PROVIDER_SITE_OTHER): Payer: Medicare Other | Admitting: Family Medicine

## 2021-08-07 VITALS — BP 138/80 | HR 64 | Temp 97.9°F | Ht 72.0 in | Wt 190.4 lb

## 2021-08-07 DIAGNOSIS — R339 Retention of urine, unspecified: Secondary | ICD-10-CM | POA: Diagnosis not present

## 2021-08-07 DIAGNOSIS — R300 Dysuria: Secondary | ICD-10-CM

## 2021-08-07 MED ORDER — DOXAZOSIN MESYLATE 1 MG PO TABS: 2.0000 mg | ORAL_TABLET | Freq: Every day | ORAL | Status: DC

## 2021-08-07 MED ORDER — CEPHALEXIN 500 MG PO CAPS: 500.0000 mg | ORAL_CAPSULE | Freq: Two times a day (BID) | ORAL | 0 refills | Status: DC

## 2021-08-07 MED ORDER — BUSPIRONE HCL 5 MG PO TABS: 5.0000 mg | ORAL_TABLET | Freq: Two times a day (BID) | ORAL | 1 refills | Status: DC

## 2021-08-07 NOTE — Patient Instructions (Addendum)
Try using 12 French 16 inch catheters twice a day.  Let me know is that isn't working well.   ?I'll check on urology follow up/second opinion.  ? ?Finish the keflex.   If you have return/worsening of symptoms when done with keflex, then collect a sample and restart keflex.   ? ?Don't change your BP meds yet.  Try taking buspar twice a day and let me know how that goes.   ? ?Take care.  Glad to see you. ? ?

## 2021-08-07 NOTE — Progress Notes (Signed)
He is currently on '2mg'$  doxazosin.   ? ?Mood d/w pt.  He is clearly bothered by his bladder situation, anxiety and depression due to the illness.  He is ruminating about this situation.   No SI/HI.  He is having to cath BID.  He needs new cath, had been using 14 french.  He is asking about urology second opinion, he was ask about options.  No fevers.  Burning with urination is better in the meantime.   ? ?D/w pt about getting tertiary care referral with urology.   ? ?He had a nonengorged tick on the R forearm, removed this AM.  Local irritation but no fevers.   ? ?Meds, vitals, and allergies reviewed.  ? ?ROS: Per HPI unless specifically indicated in ROS section  ? ?Nad ?Ncat ?Neck supple, no lymphadenopathy ?Rrr ?Ctab ?Abd soft, not ttp.  ?Extremities well perfused.  No edema. ?Right forearm with minimal residual irritation at the tick bite site but no spreading erythema otherwise. ? ?30 minutes were devoted to patient care in this encounter (this includes time spent reviewing the patient's file/history, interviewing and examining the patient, counseling/reviewing plan with patient).  ? ?

## 2021-08-10 DIAGNOSIS — R339 Retention of urine, unspecified: Secondary | ICD-10-CM | POA: Insufficient documentation

## 2021-08-10 NOTE — Assessment & Plan Note (Signed)
Discussed options.  I will check on referral to tertiary care center.  Resume intermittent catheterization in the meantime.  He can try using 12 French 16 inch catheters twice a day.  He can let me know if that  ?isn't working well.   ? ?In the meantime he can finish the keflex.   If return/worsening of symptoms when done with keflex, then collect a sample and restart keflex.   ? ?I advised him not to change his BP meds yet.  Given mood changes related to his urinary situation, it would be reasonable to try taking buspar twice a day and let me know how that goes.  He agrees with plan.  Still okay for outpatient follow-up. ?

## 2021-08-21 ENCOUNTER — Ambulatory Visit (INDEPENDENT_AMBULATORY_CARE_PROVIDER_SITE_OTHER): Payer: Medicare Other | Admitting: Family Medicine

## 2021-08-21 ENCOUNTER — Encounter: Payer: Self-pay | Admitting: Family Medicine

## 2021-08-21 ENCOUNTER — Telehealth: Payer: Self-pay

## 2021-08-21 DIAGNOSIS — N400 Enlarged prostate without lower urinary tract symptoms: Secondary | ICD-10-CM | POA: Diagnosis not present

## 2021-08-21 DIAGNOSIS — F419 Anxiety disorder, unspecified: Secondary | ICD-10-CM | POA: Diagnosis not present

## 2021-08-21 DIAGNOSIS — I10 Essential (primary) hypertension: Secondary | ICD-10-CM

## 2021-08-21 MED ORDER — BUSPIRONE HCL 10 MG PO TABS: 10.0000 mg | ORAL_TABLET | Freq: Two times a day (BID) | ORAL | 1 refills | Status: DC

## 2021-08-21 MED ORDER — DOXAZOSIN MESYLATE 1 MG PO TABS: 2.0000 mg | ORAL_TABLET | Freq: Every day | ORAL | 1 refills | Status: DC

## 2021-08-21 NOTE — Telephone Encounter (Signed)
Started prior auth for Doxazosin Mesylate '1MG'$  tablets.  Waiting for determination.  [name] [place]: [id number] - Rx #: [id number]  Your information has been submitted to [place]. [employer] will review the request and notify you of the determination decision directly, typically within 3 business days of your submission and once all necessary information is received.

## 2021-08-21 NOTE — Telephone Encounter (Signed)
Prior auth approved. [name] [place]: [id number] - Rx #: [id number] Approved today Effective from 08/21/2021 through 08/22/2022. Doxazosin Mesylate '1MG'$  tablets

## 2021-08-21 NOTE — Patient Instructions (Signed)
I'll check on the [facility] referral.  Increase the buspar to '10mg'$  twice a day.  If needed, if your BP stays up, increase the doxaxosin to '3mg'$  a day.  Otherwise continue '2mg'$  a day.  Take care.  Glad to see you. Update me as needed.   Keep using your catheters as you have been.

## 2021-08-21 NOTE — Progress Notes (Signed)
Hypertension:    Using medication without problems or lightheadedness: yes Chest pain with exertion:no Edema:no Short of breath:no Average home BPs: 120-150s/70-80s  On losartan '100mg'$  and doxazosin '2mg'$  a day.  Discussed continuing both for now.  He is using a cath BID.  6F cath.  He voids some, then tries to cath.  Variable amount UOP via cath.  No burning with urination.  We talked about follow-up at [facility] and the rationale to go to Dr. Veronda Prude to get a second opinion.  On buspar '5mg'$  BID.  D/w pt about dosing with possible plan for inc.  It helped his mood so far.  Worries tend to affect his BP, ie anxiety---->inc BP.  His amount of worrying improved some on BuSpar.  Meds, vitals, and allergies reviewed.   ROS: Per HPI unless specifically indicated in ROS section   GEN: nad, alert and oriented HEENT: ncat NECK: supple w/o LA CV: rrr.  PULM: ctab, no inc wob ABD: soft, +bs EXT: no edema Skin well perfused.  Speech and judgment intact.

## 2021-08-23 DIAGNOSIS — F419 Anxiety disorder, unspecified: Secondary | ICD-10-CM | POA: Insufficient documentation

## 2021-08-23 NOTE — Assessment & Plan Note (Signed)
Increase BuSpar first If needed, if BP stays up, increase the doxaxosin to '3mg'$  a day.  Otherwise continue '2mg'$  a day.

## 2021-08-23 NOTE — Assessment & Plan Note (Signed)
Requiring catheterization intermittently.  Continue with that.  He may benefit from a higher dose of doxazosin but we are going to increase his BuSpar first.  Discussed referral to [facility] and I am awaiting input on that.

## 2021-08-23 NOTE — Assessment & Plan Note (Signed)
Anxiety exacerbated by underlying health concerns, as would be understandable. Discussed options Increase buspar to '10mg'$  twice a day.  Treating his anxiety may actually help with his blood pressure.  See hypertension discussion.  Still okay for outpatient follow-up.

## 2021-08-26 ENCOUNTER — Other Ambulatory Visit: Payer: Medicare Other

## 2021-08-26 DIAGNOSIS — R300 Dysuria: Secondary | ICD-10-CM

## 2021-08-26 DIAGNOSIS — R339 Retention of urine, unspecified: Secondary | ICD-10-CM | POA: Diagnosis not present

## 2021-08-27 LAB — URINE CULTURE
MICRO NUMBER:: 13439819
SPECIMEN QUALITY:: ADEQUATE

## 2021-09-06 ENCOUNTER — Other Ambulatory Visit: Payer: Self-pay | Admitting: Family Medicine

## 2021-09-06 DIAGNOSIS — R300 Dysuria: Secondary | ICD-10-CM

## 2021-09-06 MED ORDER — CEPHALEXIN 500 MG PO CAPS: 500.0000 mg | ORAL_CAPSULE | Freq: Two times a day (BID) | ORAL | 0 refills | Status: DC

## 2021-09-07 ENCOUNTER — Telehealth: Payer: Self-pay

## 2021-09-07 NOTE — Telephone Encounter (Signed)
Patient is returning a phone call.  

## 2021-09-07 NOTE — Telephone Encounter (Signed)
Spoke with patient see lab notes for documentation.

## 2021-09-10 ENCOUNTER — Other Ambulatory Visit: Payer: Self-pay | Admitting: Interventional Radiology

## 2021-09-10 DIAGNOSIS — I871 Compression of vein: Secondary | ICD-10-CM

## 2021-09-26 DIAGNOSIS — R339 Retention of urine, unspecified: Secondary | ICD-10-CM | POA: Diagnosis not present

## 2021-10-02 ENCOUNTER — Encounter: Payer: Self-pay | Admitting: Family Medicine

## 2021-10-02 ENCOUNTER — Ambulatory Visit (INDEPENDENT_AMBULATORY_CARE_PROVIDER_SITE_OTHER): Payer: Medicare Other | Admitting: Family Medicine

## 2021-10-02 VITALS — BP 130/82 | HR 61 | Temp 98.0°F | Ht 72.0 in | Wt 192.0 lb

## 2021-10-02 DIAGNOSIS — R300 Dysuria: Secondary | ICD-10-CM | POA: Diagnosis not present

## 2021-10-02 DIAGNOSIS — M25519 Pain in unspecified shoulder: Secondary | ICD-10-CM

## 2021-10-02 DIAGNOSIS — R339 Retention of urine, unspecified: Secondary | ICD-10-CM

## 2021-10-02 LAB — URINALYSIS, ROUTINE W REFLEX MICROSCOPIC
Bilirubin Urine: NEGATIVE
Ketones, ur: NEGATIVE
Nitrite: NEGATIVE
Specific Gravity, Urine: 1.01 (ref 1.000–1.030)
Total Protein, Urine: NEGATIVE
Urine Glucose: NEGATIVE
Urobilinogen, UA: 0.2 (ref 0.0–1.0)
pH: 7 (ref 5.0–8.0)

## 2021-10-02 LAB — UNLABELED: Test Ordered On Req: 395

## 2021-10-02 MED ORDER — TRAMADOL HCL 50 MG PO TABS: 50.0000 mg | ORAL_TABLET | Freq: Three times a day (TID) | ORAL | 0 refills | Status: DC | PRN

## 2021-10-02 NOTE — Progress Notes (Unsigned)
R shoulder sx.  Started 2 weeks ago.  Worse last week, better this week.  Tried ice, heat, etc. No trigger for shoulder pain, no new activity.  He had gone fishing but he does that at baseline.  Pain with ext rotation and int rotation.  Pain sleeping on R side.  R handed.  Normal grip.  No pain on neck ROM.    Prev BP elevation during period of pain.  Improved today.    He noted some dysuria with cloudy urine recently.  No fevers.  He has to cath intermittently.  He had urology f/u pending.  We talked about UTI vs colonization.  He has uro f/u pending.  No fevers.    Meds, vitals, and allergies reviewed.   ROS: Per HPI unless specifically indicated in ROS section   GEN: nad, alert and oriented HEENT: ncat NECK: supple w/o LA CV: rrr. PULM: ctab, no inc wob ABD: soft, +bs, nontender to palpation EXT: no edema SKIN: Well-perfused Right shoulder with pain on internal and external rotation, supraspinatus testing positive.  Pain with internal rotation improved with scapular manipulation.  No arm drop.  Distally neurovascular intact.

## 2021-10-02 NOTE — Patient Instructions (Addendum)
Go to the lab on the way out.   If you have mychart we'll likely use that to update you.     Tramadol if needed for pain.  Ice as needed.  Use the home exercises for rotator cuff.   Update me as needed.

## 2021-10-04 DIAGNOSIS — M25519 Pain in unspecified shoulder: Secondary | ICD-10-CM | POA: Insufficient documentation

## 2021-10-04 NOTE — Assessment & Plan Note (Signed)
History of requiring intermittent catheterization and we talked about UTI versus colonization.  Reasonable to check urine labs today.  Would hold on treating with antibiotics at this point since he is not having abdominal pain or fevers.  He has urology follow-up pending.  Discussed.

## 2021-10-04 NOTE — Assessment & Plan Note (Signed)
Tramadol if needed for pain.  Routine tramadol cautions discussed with patient Ice as needed Likely rotator cuff irritation and discussed home exercise program for rotator cuff.  Handout given to patient and demonstrated/explained.  He can update me as needed.  We can refer to orthopedics if needed.

## 2021-10-05 ENCOUNTER — Telehealth: Payer: Self-pay

## 2021-10-05 NOTE — Telephone Encounter (Signed)
Dr. Damita Dunnings is aware and is having patient come back to have urine culture done. See results note.

## 2021-10-05 NOTE — Telephone Encounter (Signed)
[facility] is calling in about the unlabeled urine specimen, wanted to reach out to let us know.

## 2021-10-06 LAB — URINE CULTURE
MICRO NUMBER:: 13601003
SPECIMEN QUALITY:: ADEQUATE

## 2021-10-06 LAB — TEST AUTHORIZATION

## 2021-10-06 LAB — PAT ID TIQ DOC: Test Affected: 395

## 2021-10-09 ENCOUNTER — Other Ambulatory Visit: Payer: Self-pay

## 2021-10-09 DIAGNOSIS — R339 Retention of urine, unspecified: Secondary | ICD-10-CM | POA: Diagnosis not present

## 2021-10-09 DIAGNOSIS — D6851 Activated protein C resistance: Secondary | ICD-10-CM

## 2021-10-09 DIAGNOSIS — R300 Dysuria: Secondary | ICD-10-CM | POA: Diagnosis not present

## 2021-10-11 ENCOUNTER — Other Ambulatory Visit: Payer: Self-pay | Admitting: Family Medicine

## 2021-10-11 MED ORDER — CEPHALEXIN 500 MG PO CAPS: 500.0000 mg | ORAL_CAPSULE | Freq: Two times a day (BID) | ORAL | 0 refills | Status: DC

## 2021-10-12 ENCOUNTER — Inpatient Hospital Stay: Payer: Medicare Other | Admitting: Internal Medicine

## 2021-10-12 ENCOUNTER — Encounter: Payer: Self-pay | Admitting: Internal Medicine

## 2021-10-12 ENCOUNTER — Inpatient Hospital Stay: Payer: Medicare Other | Attending: Internal Medicine

## 2021-10-12 DIAGNOSIS — Z8042 Family history of malignant neoplasm of prostate: Secondary | ICD-10-CM | POA: Insufficient documentation

## 2021-10-12 DIAGNOSIS — Z806 Family history of leukemia: Secondary | ICD-10-CM | POA: Diagnosis not present

## 2021-10-12 DIAGNOSIS — Z800 Family history of malignant neoplasm of digestive organs: Secondary | ICD-10-CM | POA: Insufficient documentation

## 2021-10-12 DIAGNOSIS — Z86718 Personal history of other venous thrombosis and embolism: Secondary | ICD-10-CM | POA: Insufficient documentation

## 2021-10-12 DIAGNOSIS — I871 Compression of vein: Secondary | ICD-10-CM | POA: Diagnosis not present

## 2021-10-12 DIAGNOSIS — D6851 Activated protein C resistance: Secondary | ICD-10-CM

## 2021-10-12 DIAGNOSIS — I10 Essential (primary) hypertension: Secondary | ICD-10-CM | POA: Diagnosis not present

## 2021-10-12 DIAGNOSIS — Z7901 Long term (current) use of anticoagulants: Secondary | ICD-10-CM | POA: Insufficient documentation

## 2021-10-12 LAB — COMPREHENSIVE METABOLIC PANEL
ALT: 17 U/L (ref 0–44)
AST: 19 U/L (ref 15–41)
Albumin: 4.3 g/dL (ref 3.5–5.0)
Alkaline Phosphatase: 61 U/L (ref 38–126)
Anion gap: 4 — ABNORMAL LOW (ref 5–15)
BUN: 17 mg/dL (ref 8–23)
CO2: 28 mmol/L (ref 22–32)
Calcium: 8.9 mg/dL (ref 8.9–10.3)
Chloride: 108 mmol/L (ref 98–111)
Creatinine, Ser: 0.96 mg/dL (ref 0.61–1.24)
GFR, Estimated: >60 mL/min (ref >60)
Glucose, Bld: 89 mg/dL (ref 70–99)
Potassium: 3.9 mmol/L (ref 3.5–5.1)
Sodium: 140 mmol/L (ref 135–145)
Total Bilirubin: 0.8 mg/dL (ref 0.3–1.2)
Total Protein: 6.9 g/dL (ref 6.5–8.1)

## 2021-10-12 LAB — CBC WITH DIFFERENTIAL/PLATELET
Abs Immature Granulocytes: 0.04 10*3/uL (ref 0.00–0.07)
Basophils Absolute: 0.1 10*3/uL (ref 0.0–0.1)
Basophils Relative: 1 %
Eosinophils Absolute: 0.4 10*3/uL (ref 0.0–0.5)
Eosinophils Relative: 9 %
HCT: 45.7 % (ref 39.0–52.0)
Hemoglobin: 15.2 g/dL (ref 13.0–17.0)
Immature Granulocytes: 1 %
Lymphocytes Relative: 29 %
Lymphs Abs: 1.3 10*3/uL (ref 0.7–4.0)
MCH: 29.3 pg (ref 26.0–34.0)
MCHC: 33.3 g/dL (ref 30.0–36.0)
MCV: 88.2 fL (ref 80.0–100.0)
Monocytes Absolute: 0.4 10*3/uL (ref 0.1–1.0)
Monocytes Relative: 9 %
Neutro Abs: 2.3 10*3/uL (ref 1.7–7.7)
Neutrophils Relative %: 51 %
Platelets: 196 10*3/uL (ref 150–400)
RBC: 5.18 MIL/uL (ref 4.22–5.81)
RDW: 12.1 % (ref 11.5–15.5)
WBC: 4.4 10*3/uL (ref 4.0–10.5)
nRBC: 0 % (ref 0.0–0.2)

## 2021-10-12 NOTE — Assessment & Plan Note (Signed)
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   No Bleeding noted.  # Prostatism symptoms: awaiting evaluation with Urology [re-evaluation with Dr.Freidlander]. ok to come off xarelto 3 days prior; and start xarelto 3 days after the procedure.   # DISPOSITION: #   RTC in 1 year for MD assessment and labs (CBC with diff, CMP)- Dr.B.

## 2021-10-12 NOTE — Patient Instructions (Signed)
#  Stop taking Xarelto 3 days prior to your urologic procedure; restart Xarelto after 3 days/or as per the recommendations of your urologist.

## 2021-10-12 NOTE — Progress Notes (Signed)
Patient currently under urology treatment for bladder issues.
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
## 2021-10-15 ENCOUNTER — Telehealth: Payer: Self-pay | Admitting: Family Medicine

## 2021-10-15 ENCOUNTER — Ambulatory Visit
Admission: RE | Admit: 2021-10-15 | Discharge: 2021-10-15 | Disposition: A | Discharge status: Home / Self Care | Payer: Medicare Other | Source: Ambulatory Visit | Attending: Interventional Radiology | Admitting: Interventional Radiology

## 2021-10-15 ENCOUNTER — Encounter: Payer: Self-pay | Admitting: *Deleted

## 2021-10-15 DIAGNOSIS — I871 Compression of vein: Secondary | ICD-10-CM

## 2021-10-15 DIAGNOSIS — Z9889 Other specified postprocedural states: Secondary | ICD-10-CM | POA: Diagnosis not present

## 2021-10-15 DIAGNOSIS — I82402 Acute embolism and thrombosis of unspecified deep veins of left lower extremity: Secondary | ICD-10-CM | POA: Diagnosis not present

## 2021-10-15 DIAGNOSIS — I82522 Chronic embolism and thrombosis of left iliac vein: Secondary | ICD-10-CM | POA: Diagnosis not present

## 2021-10-15 HISTORY — PX: IR RADIOLOGIST EVAL & MGMT: IMG5224

## 2021-10-15 NOTE — Telephone Encounter (Signed)
Drenda Freeze is calling in from [employer],in regards to medical supplies such as a catheter and lubricant. Asking for a call back for verbal orders.
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
## 2021-10-16 NOTE — Telephone Encounter (Signed)
Spoke with patient and he does not want supplies from this company as he is getting them somewhere else. I tried to call Suanne Marker back but number rings then started beeping.

## 2021-10-20 ENCOUNTER — Other Ambulatory Visit (INDEPENDENT_AMBULATORY_CARE_PROVIDER_SITE_OTHER): Payer: Medicare Other

## 2021-10-20 ENCOUNTER — Telehealth: Payer: Self-pay

## 2021-10-20 DIAGNOSIS — R300 Dysuria: Secondary | ICD-10-CM

## 2021-10-20 LAB — URINALYSIS, ROUTINE W REFLEX MICROSCOPIC
Bilirubin Urine: NEGATIVE
Ketones, ur: NEGATIVE
Nitrite: NEGATIVE
Specific Gravity, Urine: 1.02 (ref 1.000–1.030)
Total Protein, Urine: 100 — AB
Urine Glucose: NEGATIVE
Urobilinogen, UA: 0.2 (ref 0.0–1.0)
pH: 6 (ref 5.0–8.0)

## 2021-10-20 NOTE — Telephone Encounter (Signed)
Patient notified as instructed by telephone and verbalized understanding. Debbie in the lab notified that urine was dropped off.

## 2021-10-20 NOTE — Telephone Encounter (Signed)
[facility] Night - Client Nonclinical Telephone Record  [facility] Night - Client Client Site [facility] - Night Provider Renford Dills - MD Contact Type Call Who Is Calling Patient / Member / Family / Caregiver Caller Name [place] Phone Number [phone] Patient Name [name] Patient DOB 02-22-1946 Call Type Message Only Information Provided Reason for Call Request for General Office Information Initial Comment Caller states, just dropped off an urine sample. Has another UTI. Just dropped sample. Please have Dr. Gwyndolyn Kaufman. Disp. Time Disposition Final User 10/20/2021 8:04:23 AM General Information Provided Yes Jobie Quaker Call Closed By: Jobie Quaker Transaction Date/Time: 10/20/2021 8:01:25 AM (ET

## 2021-10-20 NOTE — Telephone Encounter (Addendum)
Please check u/a and ucx.  Ordered.  Would continue with keflex as is in the meantime.  Thanks.

## 2021-10-20 NOTE — Addendum Note (Signed)
Addended by: Tonia Ghent on: 10/20/2021 10:09 AM   Modules accepted: Orders

## 2021-10-20 NOTE — Telephone Encounter (Signed)
I spoke with pt; pt said burning and pain upon urination started on 10/19/21. Pt also had slight lower abd pain on 10/19/21. Pt brought urine specimen to [facility] and left in foyer and pt said he started the cephalexin 500 mg this morning but pt did get urine specimen prior to starting abx. Pt last seen by Dr Damita Dunnings on 10/02/21. Pt saw urologist at [facility] 2 wks ago; [facility] to have a camera look inside bladder to hopefully determine why bladder does not empty(cystoscopy). Pt said he does in and out cath every morning and night. Pt said no frequency or urgency of urine, no back pain, and no fever. Sending note to DR Damita Dunnings and pt request cb after Dr Damita Dunnings reviews note for further instruction. Sending note to Dr Joanne Gavel CMA and will teams [facility].

## 2021-10-21 LAB — URINE CULTURE
MICRO NUMBER:: 13661614
SPECIMEN QUALITY:: ADEQUATE

## 2021-10-23 DIAGNOSIS — R339 Retention of urine, unspecified: Secondary | ICD-10-CM | POA: Diagnosis not present

## 2021-10-23 DIAGNOSIS — Z79899 Other long term (current) drug therapy: Secondary | ICD-10-CM | POA: Diagnosis not present

## 2021-10-23 DIAGNOSIS — Z8042 Family history of malignant neoplasm of prostate: Secondary | ICD-10-CM | POA: Diagnosis not present

## 2021-10-23 DIAGNOSIS — Z86718 Personal history of other venous thrombosis and embolism: Secondary | ICD-10-CM | POA: Diagnosis not present

## 2021-10-23 DIAGNOSIS — Z885 Allergy status to narcotic agent status: Secondary | ICD-10-CM | POA: Diagnosis not present

## 2021-10-23 DIAGNOSIS — N401 Enlarged prostate with lower urinary tract symptoms: Secondary | ICD-10-CM | POA: Diagnosis not present

## 2021-10-23 DIAGNOSIS — Z7901 Long term (current) use of anticoagulants: Secondary | ICD-10-CM | POA: Diagnosis not present

## 2021-10-23 DIAGNOSIS — Z7982 Long term (current) use of aspirin: Secondary | ICD-10-CM | POA: Diagnosis not present

## 2021-10-23 DIAGNOSIS — Z7983 Long term (current) use of bisphosphonates: Secondary | ICD-10-CM | POA: Diagnosis not present

## 2021-10-23 DIAGNOSIS — I10 Essential (primary) hypertension: Secondary | ICD-10-CM | POA: Diagnosis not present

## 2021-10-26 ENCOUNTER — Ambulatory Visit (INDEPENDENT_AMBULATORY_CARE_PROVIDER_SITE_OTHER): Payer: Medicare Other | Admitting: Family Medicine

## 2021-10-26 ENCOUNTER — Encounter: Payer: Self-pay | Admitting: Family Medicine

## 2021-10-26 VITALS — BP 122/78 | HR 69 | Temp 98.0°F | Ht 72.0 in | Wt 191.0 lb

## 2021-10-26 DIAGNOSIS — R300 Dysuria: Secondary | ICD-10-CM | POA: Diagnosis not present

## 2021-10-26 MED ORDER — CEPHALEXIN 500 MG PO CAPS: 500.0000 mg | ORAL_CAPSULE | Freq: Two times a day (BID) | ORAL | 1 refills | Status: DC

## 2021-10-26 NOTE — Progress Notes (Signed)
Dysuria.  Intermittent cath.  Uro eval d/w pt.  On keflex and feels better in the meantime.  He clearly had abnormal urine with purulent/milky urine.  That is normal/resolved now.    He had seen urology with plan for next available TURP and incision of bladder neck contracture with possible injection of Kenalog.  Discussed.  Meds, vitals, and allergies reviewed.   ROS: Per HPI unless specifically indicated in ROS section   Nad Ncat Neck supple, no LA Rrr Ctab Abdomen soft, nontender Skin well perfused.

## 2021-10-26 NOTE — Patient Instructions (Signed)
Finish the antibiotics.  If you immediately get worse, then restart and let me know.    If you have trouble later on, then drop off a urine sample and restart the antibiotics then.   Take care.  Glad to see you.

## 2021-10-28 DIAGNOSIS — R300 Dysuria: Secondary | ICD-10-CM | POA: Insufficient documentation

## 2021-10-28 NOTE — Assessment & Plan Note (Signed)
He is going to follow-up with urology and hopefully TURP and bladder neck contracture treatment will improve his symptoms and decrease his need for intermittent catheterization and decrease his infection risk.  He is clearly better in the meantime on antibiotics.  Finish current Keflex prescription.  If immediately worse after completion, then restart rx and he will let me know.    If symptoms later on, then drop off a urine sample and restart the antibiotics then.   I sent him home with labeled sterile containers for urine.

## 2021-11-09 DIAGNOSIS — R339 Retention of urine, unspecified: Secondary | ICD-10-CM | POA: Diagnosis not present

## 2021-11-10 DIAGNOSIS — R300 Dysuria: Secondary | ICD-10-CM | POA: Diagnosis not present

## 2021-11-10 NOTE — Addendum Note (Signed)
Addended by: Ellamae Sia on: 11/10/2021 11:37 AM   Modules accepted: Orders

## 2021-11-10 NOTE — Addendum Note (Signed)
Addended by: Ellamae Sia on: 11/10/2021 11:38 AM   Modules accepted: Orders

## 2021-11-12 LAB — URINALYSIS, ROUTINE W REFLEX MICROSCOPIC
Bilirubin Urine: NEGATIVE
Glucose, UA: NEGATIVE
Hyaline Cast: NONE SEEN /LPF
Ketones, ur: NEGATIVE
Nitrite: NEGATIVE
Specific Gravity, Urine: 1.019 (ref 1.001–1.035)
Squamous Epithelial / HPF: NONE SEEN /HPF (ref <=5)
pH: 6.5 (ref 5.0–8.0)

## 2021-11-12 LAB — URINE CULTURE
MICRO NUMBER:: 13750590
SPECIMEN QUALITY:: ADEQUATE

## 2021-11-12 LAB — MICROSCOPIC MESSAGE

## 2021-11-23 ENCOUNTER — Encounter: Payer: Self-pay | Admitting: Family Medicine

## 2021-11-23 ENCOUNTER — Ambulatory Visit (INDEPENDENT_AMBULATORY_CARE_PROVIDER_SITE_OTHER): Payer: Medicare Other | Admitting: Family Medicine

## 2021-11-23 VITALS — BP 122/82 | HR 60 | Temp 98.0°F | Ht 72.0 in | Wt 192.0 lb

## 2021-11-23 DIAGNOSIS — I10 Essential (primary) hypertension: Secondary | ICD-10-CM | POA: Diagnosis not present

## 2021-11-23 DIAGNOSIS — N400 Enlarged prostate without lower urinary tract symptoms: Secondary | ICD-10-CM

## 2021-11-23 NOTE — Progress Notes (Unsigned)
Plan for TURP.    Timeline to hold anticoagulation? Lovenox bridge?  5 Leiden heterozygote.  H/o DVT.    Clearance d/w pt.    No h/o MI, CVA, CHF.  He can walk as needed, ie miles if needed.  He mows and weedeats 6-7 yards per week.  No CP.  SOB.  No BLE edema.    Not on abx currently but has a refill available.  No dysuria now.        Need to update [facility] and see if patient still needs to see cardiology.

## 2021-11-23 NOTE — Patient Instructions (Signed)
Let me check with [facility] and we'll be in touch.  It appears that you are appropriately low risk for surgery.   Take care.  Glad to see you.

## 2021-11-25 ENCOUNTER — Telehealth: Payer: Self-pay | Admitting: Family Medicine

## 2021-11-25 NOTE — Telephone Encounter (Signed)
Please contact [facility] urology.  Please send a copy of my office visit note.  It appears that he is appropriately low risk for surgery.  I think it likely makes sense to hold Xarelto for the 2 days prior to surgery and then the day of surgery.  If urology prefers him to hold Xarelto longer than that then we need to consider Lovenox bridge.  I think it makes sense to restart Xarelto as soon as possible after surgery.   His EKG is unremarkable.  Please send a copy of that to [facility] along with the office visit note.  If urology prefers him to also see cardiology then please let me know and I will set that up.  Thanks.

## 2021-11-25 NOTE — Assessment & Plan Note (Signed)
Discussed the situation.  I will check with [facility].  It appears that he is appropriately low risk for surgery.  I think it likely makes sense to hold Xarelto for the 2 days prior to surgery and then the day of surgery.  If urology prefers him to hold Xarelto longer than that then we need to consider Lovenox bridge.  I think it makes sense to restart Xarelto as soon as possible after surgery.  I appreciate all involved.

## 2021-11-27 ENCOUNTER — Telehealth: Payer: Self-pay

## 2021-11-27 ENCOUNTER — Other Ambulatory Visit: Payer: Medicare Other

## 2021-11-27 DIAGNOSIS — R300 Dysuria: Secondary | ICD-10-CM

## 2021-11-27 NOTE — Addendum Note (Signed)
Addended by: Tonia Ghent on: 11/27/2021 10:06 AM   Modules accepted: Orders

## 2021-11-27 NOTE — Telephone Encounter (Signed)
Noted.  I put in the ucx order. Please send him home with another collection cup for the future, if needed.  Thanks.

## 2021-11-27 NOTE — Telephone Encounter (Signed)
Patient states he has a bladder infection, is going to bring the urine sample by and started taking antibiotics yesterday.

## 2021-11-27 NOTE — Telephone Encounter (Signed)
Spoke to [place] at [facility] [phone]). She gave me the fax # for Dr Veronda Prude [phone]. Stated this message and the OV Note will be perfect.

## 2021-11-30 LAB — URINE CULTURE
MICRO NUMBER:: 13832946
SPECIMEN QUALITY:: ADEQUATE

## 2021-12-03 DIAGNOSIS — R339 Retention of urine, unspecified: Secondary | ICD-10-CM | POA: Diagnosis not present

## 2021-12-04 ENCOUNTER — Other Ambulatory Visit: Payer: Self-pay | Admitting: Family Medicine

## 2021-12-04 DIAGNOSIS — R300 Dysuria: Secondary | ICD-10-CM

## 2021-12-04 MED ORDER — CEPHALEXIN 500 MG PO CAPS: 500.0000 mg | ORAL_CAPSULE | Freq: Two times a day (BID) | ORAL | 1 refills | Status: DC

## 2021-12-08 DIAGNOSIS — H40011 Open angle with borderline findings, low risk, right eye: Secondary | ICD-10-CM | POA: Diagnosis not present

## 2021-12-08 DIAGNOSIS — H43811 Vitreous degeneration, right eye: Secondary | ICD-10-CM | POA: Diagnosis not present

## 2021-12-08 DIAGNOSIS — H2511 Age-related nuclear cataract, right eye: Secondary | ICD-10-CM | POA: Diagnosis not present

## 2021-12-08 DIAGNOSIS — H401121 Primary open-angle glaucoma, left eye, mild stage: Secondary | ICD-10-CM | POA: Diagnosis not present

## 2021-12-14 ENCOUNTER — Telehealth: Payer: Self-pay

## 2021-12-14 ENCOUNTER — Other Ambulatory Visit: Payer: Medicare Other

## 2021-12-14 DIAGNOSIS — R300 Dysuria: Secondary | ICD-10-CM

## 2021-12-14 NOTE — Addendum Note (Signed)
Addended by: Tonia Ghent on: 12/14/2021 01:50 PM   Modules accepted: Orders

## 2021-12-14 NOTE — Telephone Encounter (Signed)
Culture has been done.

## 2021-12-14 NOTE — Telephone Encounter (Signed)
Patient dropped off a urine sample this morning but did not call or say why. I called patient to see if he was having another infection and what his sx were/are. Patient states it started Friday but he could not get here in time it drop off a urine specimen. He started his abx Friday and has started to feel better now. He had burning and freq. Urination. What tests would you like done?

## 2021-12-14 NOTE — Telephone Encounter (Signed)
Urine culture.  Thanks.  I put in the order.

## 2021-12-17 ENCOUNTER — Ambulatory Visit (INDEPENDENT_AMBULATORY_CARE_PROVIDER_SITE_OTHER): Payer: Medicare Other | Admitting: Family Medicine

## 2021-12-17 ENCOUNTER — Encounter: Payer: Self-pay | Admitting: Family Medicine

## 2021-12-17 VITALS — BP 130/80 | HR 56 | Temp 97.0°F | Wt 190.0 lb

## 2021-12-17 DIAGNOSIS — R339 Retention of urine, unspecified: Secondary | ICD-10-CM | POA: Diagnosis not present

## 2021-12-17 DIAGNOSIS — Z23 Encounter for immunization: Secondary | ICD-10-CM

## 2021-12-17 LAB — URINE CULTURE
MICRO NUMBER:: 13899149
SPECIMEN QUALITY:: ADEQUATE

## 2021-12-17 NOTE — Progress Notes (Unsigned)
UTI.  Ucx d/w pt.  On keflex.  Plan for upcoming surgery on 01/04/22 per urology.  Due to frequency of UTIs, d/w pt about preemptive abx prior to surgery.  I need input from urology about that.  It may make sense for the patient to take a course of antibiotics the week prior to surgery, even if doing well.   He is on keflex, with a few days left.  He has refill to use if needed.  He is improved in the meantime.  Still having to cath intermittently.  No burning with urination now.    I think it likely makes sense to hold Xarelto for the 2 days prior to surgery and then the day of surgery.  I told patient I would check on options about holding aspirin.    D/w pt about upcoming eye surgery.   Flu shot today.    Meds, vitals, and allergies reviewed.   ROS: Per HPI unless specifically indicated in ROS section   GEN: nad, alert and oriented HEENT: ncat NECK: supple w/o LA CV: rrr. PULM: ctab, no inc wob ABD: soft, +bs EXT: no edema SKIN: no acute rash

## 2021-12-17 NOTE — Patient Instructions (Signed)
Finish the current antibiotics and I'll update urology and ophthalmology.  Take care.  Glad to see you.

## 2021-12-22 NOTE — Assessment & Plan Note (Signed)
With recurrent UTI that responds to antibiotics.  I will check with urology about his anticoagulation and aspirin and also the consideration of taking a course of antibiotics prior to his planned surgery.  Finish current course of Keflex in the meantime.  Okay for outpatient follow-up.

## 2021-12-23 DIAGNOSIS — R339 Retention of urine, unspecified: Secondary | ICD-10-CM | POA: Diagnosis not present

## 2021-12-25 ENCOUNTER — Telehealth: Payer: Self-pay | Admitting: Family Medicine

## 2021-12-25 DIAGNOSIS — G509 Disorder of trigeminal nerve, unspecified: Secondary | ICD-10-CM | POA: Diagnosis not present

## 2021-12-25 DIAGNOSIS — F419 Anxiety disorder, unspecified: Secondary | ICD-10-CM | POA: Diagnosis not present

## 2021-12-25 DIAGNOSIS — R339 Retention of urine, unspecified: Secondary | ICD-10-CM | POA: Diagnosis not present

## 2021-12-25 DIAGNOSIS — I10 Essential (primary) hypertension: Secondary | ICD-10-CM | POA: Diagnosis not present

## 2021-12-25 DIAGNOSIS — R300 Dysuria: Secondary | ICD-10-CM

## 2021-12-25 DIAGNOSIS — I871 Compression of vein: Secondary | ICD-10-CM | POA: Diagnosis not present

## 2021-12-25 NOTE — Telephone Encounter (Signed)
Patient called in to let Dr Damita Dunnings know that he went to [facility] and his doctor there wants him to be off of his blood thinner Xarelto for 3 days,instead of 2,before his surgery. He will need the rx that is going to replace his blood thinner called in for him.

## 2021-12-28 MED ORDER — CEPHALEXIN 500 MG PO CAPS: 500.0000 mg | ORAL_CAPSULE | Freq: Two times a day (BID) | ORAL | 1 refills | Status: DC

## 2021-12-28 MED ORDER — ENOXAPARIN SODIUM 40 MG/0.4ML IJ SOSY: 40.0000 mg | PREFILLED_SYRINGE | INTRAMUSCULAR | 0 refills | Status: DC

## 2021-12-28 NOTE — Telephone Encounter (Addendum)
Please update patient and urology.    Due to frequency of UTIs, I would take Keflex preemptively for the week prior to surgery.  I sent the prescription.    His surgery is scheduled for 01/04/2022.  If he is going to hold Xarelto for 3 days plus the day of surgery (that would mean stopping the medication on 9/29, 9/30, and 10/1), then I would take a dose of lovenox on 9/30 and 10/1.  He should not need a dose on 9/29.  I will defer to urology and the patient about how long he should stop aspirin.  I presume surgery will want him to stop it for 3 days, the same as Xarelto.  If they want him to stop it for longer, then that is fine with me.  Please make sure he has clarification from urology about that  Thanks.

## 2021-12-28 NOTE — Addendum Note (Signed)
Addended by: Tonia Ghent on: 12/28/2021 08:50 AM   Modules accepted: Orders

## 2021-12-28 NOTE — Telephone Encounter (Signed)
Called patient reviewed all information and repeated back to me. Will call if any questions.   Patient states that was told by urology to have you call in the lovenox. He will let us know what urology said about holding Asprin.   Did you want me to call urology and give recommendations on blood thinner?

## 2021-12-29 NOTE — Telephone Encounter (Signed)
Please talk to me about this.  I already sent the prescription for Lovenox. Thanks.

## 2021-12-29 NOTE — Telephone Encounter (Signed)
Called patient to see if he picked up lovenox and he stated he is on his way to get it now.

## 2022-01-04 DIAGNOSIS — I10 Essential (primary) hypertension: Secondary | ICD-10-CM | POA: Diagnosis not present

## 2022-01-04 DIAGNOSIS — R338 Other retention of urine: Secondary | ICD-10-CM | POA: Diagnosis not present

## 2022-01-04 DIAGNOSIS — Z86718 Personal history of other venous thrombosis and embolism: Secondary | ICD-10-CM | POA: Diagnosis not present

## 2022-01-04 DIAGNOSIS — R319 Hematuria, unspecified: Secondary | ICD-10-CM | POA: Diagnosis not present

## 2022-01-04 DIAGNOSIS — Z7901 Long term (current) use of anticoagulants: Secondary | ICD-10-CM | POA: Diagnosis not present

## 2022-01-04 DIAGNOSIS — I871 Compression of vein: Secondary | ICD-10-CM | POA: Diagnosis not present

## 2022-01-04 DIAGNOSIS — F419 Anxiety disorder, unspecified: Secondary | ICD-10-CM | POA: Diagnosis not present

## 2022-01-04 DIAGNOSIS — I82611 Acute embolism and thrombosis of superficial veins of right upper extremity: Secondary | ICD-10-CM | POA: Diagnosis not present

## 2022-01-04 DIAGNOSIS — N401 Enlarged prostate with lower urinary tract symptoms: Secondary | ICD-10-CM | POA: Diagnosis not present

## 2022-01-04 DIAGNOSIS — T8383XA Hemorrhage of genitourinary prosthetic devices, implants and grafts, initial encounter: Secondary | ICD-10-CM | POA: Diagnosis not present

## 2022-01-04 DIAGNOSIS — N138 Other obstructive and reflux uropathy: Secondary | ICD-10-CM | POA: Diagnosis not present

## 2022-01-04 DIAGNOSIS — Z7982 Long term (current) use of aspirin: Secondary | ICD-10-CM | POA: Diagnosis not present

## 2022-01-04 DIAGNOSIS — R339 Retention of urine, unspecified: Secondary | ICD-10-CM | POA: Diagnosis not present

## 2022-01-04 DIAGNOSIS — N320 Bladder-neck obstruction: Secondary | ICD-10-CM | POA: Diagnosis not present

## 2022-01-04 DIAGNOSIS — R310 Gross hematuria: Secondary | ICD-10-CM | POA: Diagnosis not present

## 2022-01-04 DIAGNOSIS — D6851 Activated protein C resistance: Secondary | ICD-10-CM | POA: Diagnosis not present

## 2022-01-04 DIAGNOSIS — E785 Hyperlipidemia, unspecified: Secondary | ICD-10-CM | POA: Diagnosis not present

## 2022-01-11 ENCOUNTER — Telehealth: Payer: Self-pay

## 2022-01-11 ENCOUNTER — Telehealth: Payer: Self-pay | Admitting: *Deleted

## 2022-01-11 NOTE — Patient Outreach (Signed)
  Care Coordination TOC Note Transition Care Management Unsuccessful Follow-up Telephone Call  Date of discharge and from where:  [facility]  Attempts:  1st Attempt  Reason for unsuccessful TCM follow-up call:  Left voice message  [facility] [phone]

## 2022-01-11 NOTE — Telephone Encounter (Signed)
Transition Care Management Follow-up Telephone Call Date of discharge and from where: [facility] hospital on 01/09/2022 How have you been since you were released from the hospital? About the same from D/C no new symptoms  Any questions or concerns? Yes concerned that he was sent home to early after operation and he had to go back that is what caused his hospitalization.   Items Reviewed: Did the pt receive and understand the discharge instructions provided? Yes  Medications obtained and verified? Yes  Other? No  Any new allergies since your discharge? No  Dietary orders reviewed? No Do you have support at home? Yes   Home Care and Equipment/Supplies: Were home health services ordered? no If so, what is the name of the agency? N/A  Has the agency set up a time to come to the patient's home? not applicable Were any new equipment or medical supplies ordered?  No What is the name of the medical supply agency? N/A Were you able to get the supplies/equipment? no Do you have any questions related to the use of the equipment or supplies? No  Functional Questionnaire: (I = Independent and D = Dependent) ADLs: Patient has appointment for Cath  on Wednesday.   Bathing/Dressing- I  Meal Prep- I  Eating- I  Maintaining continence- Currently has Catheter   Transferring/Ambulation- I  Managing Meds- I  Follow up appointments reviewed:  PCP Hospital f/u appt confirmed? Yes  Scheduled to see Dr. Damita Dunnings on 01/18/2022 @ 9:30. [place] [facility] f/u appt confirmed? Yes  Scheduled to see [facility] on 01/13/2022 @ 10:30. Are transportation arrangements needed? No  If their condition worsens, is the pt aware to call PCP or go to the Emergency Dept.? Yes Was the patient provided with contact information for the PCP's office or ED? Yes Was to pt encouraged to call back with questions or concerns? Yes

## 2022-01-13 DIAGNOSIS — R339 Retention of urine, unspecified: Secondary | ICD-10-CM | POA: Diagnosis not present

## 2022-01-15 DIAGNOSIS — F419 Anxiety disorder, unspecified: Secondary | ICD-10-CM | POA: Diagnosis not present

## 2022-01-15 DIAGNOSIS — N401 Enlarged prostate with lower urinary tract symptoms: Secondary | ICD-10-CM | POA: Diagnosis not present

## 2022-01-15 DIAGNOSIS — E785 Hyperlipidemia, unspecified: Secondary | ICD-10-CM | POA: Diagnosis not present

## 2022-01-15 DIAGNOSIS — Z885 Allergy status to narcotic agent status: Secondary | ICD-10-CM | POA: Diagnosis not present

## 2022-01-15 DIAGNOSIS — Z7901 Long term (current) use of anticoagulants: Secondary | ICD-10-CM | POA: Diagnosis not present

## 2022-01-15 DIAGNOSIS — R319 Hematuria, unspecified: Secondary | ICD-10-CM | POA: Diagnosis not present

## 2022-01-15 DIAGNOSIS — Z79899 Other long term (current) drug therapy: Secondary | ICD-10-CM | POA: Diagnosis not present

## 2022-01-15 DIAGNOSIS — Z7982 Long term (current) use of aspirin: Secondary | ICD-10-CM | POA: Diagnosis not present

## 2022-01-15 DIAGNOSIS — I82409 Acute embolism and thrombosis of unspecified deep veins of unspecified lower extremity: Secondary | ICD-10-CM | POA: Diagnosis not present

## 2022-01-15 DIAGNOSIS — R338 Other retention of urine: Secondary | ICD-10-CM | POA: Diagnosis not present

## 2022-01-15 DIAGNOSIS — I10 Essential (primary) hypertension: Secondary | ICD-10-CM | POA: Diagnosis not present

## 2022-01-16 DIAGNOSIS — R319 Hematuria, unspecified: Secondary | ICD-10-CM | POA: Diagnosis not present

## 2022-01-18 ENCOUNTER — Inpatient Hospital Stay: Payer: Medicare Other | Admitting: Family Medicine

## 2022-01-19 ENCOUNTER — Telehealth: Payer: Self-pay

## 2022-01-19 ENCOUNTER — Telehealth: Payer: Self-pay | Admitting: Family Medicine

## 2022-01-19 NOTE — Telephone Encounter (Signed)
Spoke with patient. We are completely booked up this week. Asked if patient had anything urgent he needed to be seen for but he just wanted to get in sooner before his appt with [facility]. I advised if I see anything open up that I would call back.

## 2022-01-19 NOTE — Telephone Encounter (Signed)
Patient called and wanted to know if CMA Janett Billow could get him in earlier for an appointment from his appointment on 01/26/2022 at 11:00 am.

## 2022-01-19 NOTE — Telephone Encounter (Signed)
Transition Care Management Follow-up Telephone Call Date of discharge and from where: [facility] [address] 01/18/2022 How have you been since you were released from the hospital? good Any questions or concerns? No  Items Reviewed: Did the pt receive and understand the discharge instructions provided? Yes  Medications obtained and verified? Yes  Other? No  Any new allergies since your discharge? No  Dietary orders reviewed? Yes Do you have support at home? No   Home Care and Equipment/Supplies: Were home health services ordered? no If so, what is the name of the agency? N/a  Has the agency set up a time to come to the patient's home? not applicable Were any new equipment or medical supplies ordered?  No What is the name of the medical supply agency? N/a Were you able to get the supplies/equipment? not applicable Do you have any questions related to the use of the equipment or supplies? No  Functional Questionnaire: (I = Independent and D = Dependent) ADLs: I  Bathing/Dressing- I  Meal Prep- I  Eating- I  Maintaining continence- I  Transferring/Ambulation- I  Managing Meds- I  Follow up appointments reviewed:  PCP Hospital f/u appt confirmed? Yes  Scheduled to see Dr Damita Dunnings on 01/21/2022 @ 9:00. Okay Hospital f/u appt confirmed? Yes  Scheduled to see [facility] urology on 01/25/2022 @ 9:00. Are transportation arrangements needed? Yes  If their condition worsens, is the pt aware to call PCP or go to the Emergency Dept.? Yes Was the patient provided with contact information for the PCP's office or ED? Yes Was to pt encouraged to call back with questions or concerns? Yes Juanda Crumble, LPN [place] Direct Dial [phone]

## 2022-01-19 NOTE — Progress Notes (Cosign Needed)
Chronic Care Management Pharmacy Assistant   Name: [name]  MRN: [id number] DOB: July 01, 1945  Reason for Encounter: CCM (Appointment Reminder and [place])   Medications: Outpatient Encounter Medications as of 01/19/2022  Medication Sig   aspirin EC 81 MG tablet Take 81 mg by mouth daily.    bimatoprost (LUMIGAN) 0.01 % SOLN Place 1 drop into both eyes at bedtime.   busPIRone (BUSPAR) 10 MG tablet Take 1 tablet (10 mg total) by mouth 2 (two) times daily.   doxazosin (CARDURA) 1 MG tablet Take 2 tablets (2 mg total) by mouth daily. With extra tab daily if BP is still above 140/90.  Max 3 tabs per day.   enoxaparin (LOVENOX) 40 MG/0.4ML injection Inject 0.4 mLs (40 mg total) into the skin daily. For 2 days.  Starting on 01/02/22.  Hold xarelto with use.   losartan (COZAAR) 100 MG tablet Take 1 tablet (100 mg total) by mouth daily.   psyllium (METAMUCIL) 58.6 % packet Take 1 packet by mouth daily.   rivaroxaban (XARELTO) 20 MG TABS tablet Take 1 tablet (20 mg total) by mouth every evening.   No facility-administered encounter medications on file as of 01/19/2022.   [name] was contacted to remind of upcoming telephone visit with [name] on 01/22/2022 at 8:45. Patient was reminded to have any blood glucose and blood pressure readings available for review at appointment.   Message was left reminding patient of appointment.  CCM referral has been placed prior to visit?  Yes   Star Rating Drugs: Medication:  Last Fill: Day Supply Losartan 100 mg 10/25/21 90  Reviewed hospital notes for details of recent visit. Has patient been contacted by Transitions of Care team? No Has patient seen PCP/specialist for hospital follow up (summarize OV if yes): No  Admitted to the hospital on 01/15/2022. Discharge date was 01/18/2022.  Discharged from [facility].   Discharge diagnosis (Principal Problem): Hematuria Patient was discharged to Home  Brief summary of  hospital course: 76 y.o. male w history of Factor V Leiden on, DVT on Xarelto, HTN, BPPV and BPH who is s/p TURP with [name] on 01/04/22. He was re-admitted on 10/3 for post-op hematuria and required CBI. Xarelto was held and hematuria resolved. He was started on heparin prior to discharge and urine remained clear. He was discharged on Lovenox bridge on 10/7. He presented to the [facility] ER 10/13 with hematuria.  In the ER he was HDS and afebrile. H/H were stable. No leukocytosis. Urine was thin dark merlot with no clots. Bedside ultrasound was negative for formed clot. 22 Fr 3 way catheter was placed and CBI was initiated. Catheter was hand irrigated with no clots seen. Urine cleared to light pink - raspberry lemonade on CBI.   The patient was admitted to the floor and placed on continuous bladder irrigation after being hand irrigated clear. Overnight, the CBI was titrated to light pink, and by the morning of hospital day 1, the patient's urine was clear on a slow drip. At this time, the CBI was turned off and the patient's urine remained clear.  SQH was started and urine remained clear overnight. TOV on hospital day 2 was unsuccessful with 550 ml clear yellow urine retained. Foley was replaced. Xarelto restarted now that hematuria has resolved.   The patient was discharged home with a foley catheter in place on hospital day 3, at which point he was tolerating a regular solid diet, obtaining adequate pain control  with P.O. pain medication, and ambulating at baseline.   *He will return for TOV in 1 week. If he does not pass his TOV next week, he will need to be taught how to CIC.*  Medications that remain the same after Hospital Discharge:??  -All other medications will remain the same.    Next CCM appt: 01/22/2022  Other upcoming appts: PCP appointment on 01/21/2022 for hospital follow up  [name], PharmD notified and will determine if action is needed.   Reviewed hospital notes  for details of recent visit. Has patient been contacted by Transitions of Care team? Yes Has patient seen PCP/specialist for hospital follow up (summarize OV if yes): No  Admitted to the hospital on 01/05/2022. Discharge date was 01/08/2022.  Discharged from [facility].   Discharge diagnosis (Principal Problem): Gross hematuria Patient was discharged to Home  Brief summary of hospital course: [name] is a 76 y.o. male with PMH of anxiety, BPPV, Factor V leiden, DVT on Xarelto, GERD, HLD, HTN, and BPH now s/p re-TURP and BNC incision on 01/04/2022 who was re-admitted for post-operative hematuria. Weaned CBI to off yesterday.   Bladder US with clot around catheter balloon. Catheter balloon deflated and manual irrigation performed to dislodge clot; returned ~250cc dark maroon old clot.   B/L LE dopplers negative for DVT.    Plan  - Discontinue CBI - Start UFH 5000 units TID per Hematology. Monitor for recurrence of hematuria. If urine remains clear will plan for discharge later today vs tomorrow. Will need to be discharged home on Lovenox bridge.  - Regular diet, medlock - PRN analgesia - SCDs  -TOV in one week -Augmentin through time of TOV   Final Interpretation  Right  There is no evidence of DVT in the lower extremity. There is no evidence of obstruction proximal to the inguinal ligament or in the common femoral vein.  Left  Abnormalities consistent with the sequela of a prior venous obstructive process, with findings that appear chronic/long standing in nature are identified in the proximal veins.    New?Medications Started at [facility] Discharge:?? -Started enoxaparin (LOVENOX) 40 mg/0.4 mL Syrg   -Started tamsulosin (FLOMAX) 0.4 mg capsule   -Started oxybutynin (DITROPAN) 5 MG tablet   -Started oxyCODONE (ROXICODONE) 5 MG immediate release tablet   -Started amoxicillin-clavulanate (AUGMENTIN) 875-125 mg per tablet   Medications that remain the same after Hospital  Discharge:??  -All other medications will remain the same.     Pharmacist addendum: CCM appt 10/20, will discuss then.  [name], PharmD, BCACP 01/21/22 3:44 PM

## 2022-01-19 NOTE — Telephone Encounter (Signed)
Dr. Damita Dunnings had a cancellation on 01/21/22 at 9:00 am; called patient and mover hospital f/u to then.

## 2022-01-20 NOTE — Telephone Encounter (Signed)
Noted. Thanks.

## 2022-01-21 ENCOUNTER — Ambulatory Visit (INDEPENDENT_AMBULATORY_CARE_PROVIDER_SITE_OTHER): Payer: Medicare Other | Admitting: Family Medicine

## 2022-01-21 ENCOUNTER — Encounter: Payer: Self-pay | Admitting: Family Medicine

## 2022-01-21 DIAGNOSIS — N400 Enlarged prostate without lower urinary tract symptoms: Secondary | ICD-10-CM

## 2022-01-21 NOTE — Patient Instructions (Addendum)
Update me Tuesday after your urology appointment on Monday.   We'll make plans with the eye clinic at that point.   Take care.  Glad to see you.

## 2022-01-21 NOTE — Progress Notes (Signed)
Chronic Care Management Pharmacy Note  01/22/2022 Name:  [name] MRN:  [id number] DOB:  04-02-1946  Summary: CCM F/U visit -Pt recently restarted Xarelto following TURP procedure/Lovenox bridge, he denies further bleeding issues -Reviewed drug interaction between doxazosin and tamsulosin - both alpha blockers, can lead to hypotension; his BP was normal in office and he denies dizziness/lightheadedness  Recommendations/Changes made from today's visit: -Advised to monitor for s/sx of hypotension; alert provider if any low BP  Plan: -[place] will call patient 2 months for BP update -Pharmacist follow up televisit PRN    Subjective: [name] is an 76 y.o. year old male who is a primary patient of Damita Dunnings, Elveria Rising, MD.  The CCM team was consulted for assistance with disease management and care coordination needs.    Engaged with patient by telephone for follow up visit in response to provider referral for pharmacy case management and/or care coordination services.   Consent to Services:  The patient was given information about Chronic Care Management services, agreed to services, and gave verbal consent prior to initiation of services.  Please see initial visit note for detailed documentation.   Patient Care Team: Tonia Ghent, MD as PCP - General Myrlene Broker, MD as Attending Physician (Urology) Owens Loffler, MD as Consulting Physician (Family Medicine) Carolan Clines, MD (Inactive) as Consulting Physician (Urology) Lequita Asal, MD (Inactive) as Referring Physician (Hematology and Oncology) Corrie Mckusick, DO as Consulting Physician (Interventional Radiology) Venia Carbon, MD as Referring Physician (Internal Medicine) Charlton Haws, [facility] as Pharmacist (Pharmacist) Cammie Sickle, MD as Consulting Physician (Oncology)  Recent office visits: 01/21/22 Dr Damita Dunnings OV: hosp f/u - urology f/u Monday. 12/17/21 Dr Damita Dunnings  OV: UTI, given flu shot. 11/23/21 Dr Damita Dunnings OV: clerance - ok to hold Xarelto 2 days and start day after. 10/26/21 Dr Damita Dunnings OV: dysuria - f/u urology and hopeful for TURP. Finish keflex.  10/02/21 Dr Damita Dunnings OV: shoulder pain - rx tramadol. Given rx for Keflex to have on hand for UTI sx.  08/21/21 Dr Damita Dunnings OV: anxiety - increase buspar to 10 mg BID. Doxazosin max 3 mg per day.  08/07/21 Dr Damita Dunnings OV: dysuria. He is taking doxazosin 2 mg. Referral to urology. Resume intermittent cath. No change to BP meds yet. Try buspar BID for anxiety.  07/21/21 Dr Damita Dunnings OV: f/u HTN - had increased doxazosin to 2 mg and had low 100/60. Rec to change doxazosin to 1.5 mg daily.  06/22/21 Dr Damita Dunnings OV: f/u HTN. If lightheaded cut Doxazosin to 1/2 tab. IF BP elevated, increase to 1.5-2 tab. Do not change losartan. 05/19/2021 - Elsie Stain, MD - Hypertension. Start: CARDURA 1 MG. Stop: FLEXERIL 10 MG  and ANTIVERT 25 MG. 05/04/2021 - Elsie Stain, MD - Hypertension - Change: Losartan 100 mg (dose increase) do to lightheadedness 04/27/2021 - Elsie Stain, MD - Hypertension - Change: Losartan 50 mg - extra 1/2 tab if needed  Recent consult visits: 10/12/21 Dr Rogue Bussing (Hematology): Factor 5 leiden mutation - indefinite Xarelto. Ok to hold a/c for procedure 3 days before and after.  10/09/21 Dr Veronda Prude (Urology): titrate doxazosin up to 4 mg.  Hospital visits: 01/15/22 ED visit [facility]): hematuria. Replaced foley. Xarelto restarted.  01/05/22-01/09/22 Admission [facility]): s/p TURP - hematuria. Xarelto was held. Discharged on Lovenox bridge  07/11/21 ED visit [facility]): hypertension - 160/100 at home. Took extra dose of cardura at home. F/u with PCP.  04/25/21 ED visit [facility]): hypertension -  BP improved in ED.  Objective:  Lab Results  Component Value Date   CREATININE 0.96 10/12/2021   BUN 17 10/12/2021   GFR 73.53 05/04/2021   GFRNONAA >60 10/12/2021   GFRAA >60 10/09/2019   NA 140 10/12/2021   K 3.9 10/12/2021    CALCIUM 8.9 10/12/2021   CO2 28 10/12/2021   GLUCOSE 89 10/12/2021    Lab Results  Component Value Date/Time   GFR 73.53 05/04/2021 09:10 AM   GFR 71.93 02/12/2021 09:08 AM    Last diabetic Eye exam: No results found for: "HMDIABEYEEXA"  Last diabetic Foot exam: No results found for: "HMDIABFOOTEX"   Lab Results  Component Value Date   CHOL 185 02/12/2021   HDL 57.20 02/12/2021   LDLCALC 116 (H) 02/12/2021   LDLDIRECT 140.1 01/27/2007   TRIG 60.0 02/12/2021   CHOLHDL 3 02/12/2021       Latest Ref Rng & Units 10/12/2021    8:07 AM 02/12/2021    9:08 AM 10/07/2020    8:17 AM  Hepatic Function  Total Protein 6.5 - 8.1 g/dL 6.9  6.6  7.1   Albumin 3.5 - 5.0 g/dL 4.3  4.5  4.6   AST 15 - 41 U/L _0 ALT 0 - 44 U/L _1 Alk Phosphatase 38 - 126 U/L 61  63  62   Total Bilirubin 0.3 - 1.2 mg/dL 0.8  0.7  1.0     Lab Results  Component Value Date/Time   TSH 1.47 02/07/2009 08:46 AM   TSH 1.21 02/05/2008 09:26 AM       Latest Ref Rng & Units 10/12/2021    8:07 AM 07/11/2021    5:39 PM 04/25/2021    7:36 AM  CBC  WBC 4.0 - 10.5 K/uL 4.4  4.1  5.4   Hemoglobin 13.0 - 17.0 g/dL 15.2  15.0  16.0   Hematocrit 39.0 - 52.0 % 45.7  44.7  47.2   Platelets 150 - 400 K/uL 196  169  183     Lab Results  Component Value Date/Time   VD25OH 39 02/07/2009 09:10 PM    Clinical ASCVD: No  The 10-year ASCVD risk score (Arnett DK, et al., 2019) is: 23.2%   Values used to calculate the score:     Age: 76 years     Sex: Male     Is Non-Hispanic African American: No     Diabetic: No     Tobacco smoker: No     Systolic Blood Pressure: 856 mmHg     Is BP treated: Yes     HDL Cholesterol: 57.2 mg/dL     Total Cholesterol: 185 mg/dL       04/24/2021   11:39 AM 04/07/2020   12:17 PM 02/11/2020    8:22 AM  Depression screen PHQ 2/9  Decreased Interest 0 0 0  Down, Depressed, Hopeless 0 0 0  PHQ - 2 Score 0 0 0     Social History   Tobacco Use  Smoking Status  Former  Smokeless Tobacco Former   BP Readings from Last 3 Encounters:  01/21/22 122/79  12/17/21 130/80  11/23/21 122/82   Pulse Readings from Last 3 Encounters:  01/21/22 72  12/17/21 (!) 56  11/23/21 60   Wt Readings from Last 3 Encounters:  01/21/22 189 lb (85.7 kg)  12/17/21 190 lb (86.2 kg)  11/23/21 192 lb (87.1 kg)   BMI Readings  from Last 3 Encounters:  01/21/22 25.63 kg/m  12/17/21 25.77 kg/m  11/23/21 26.04 kg/m    Assessment/Interventions: Review of patient past medical history, allergies, medications, health status, including review of consultants reports, laboratory and other test data, was performed as part of comprehensive evaluation and provision of chronic care management services.   SDOH:  (Social Determinants of Health) assessments and interventions performed: Yes SDOH Interventions    Flowsheet Row Chronic Care Management from 06/24/2021 in [place] [facility] [place] from 01/29/2019 in [place] at [place]  SDOH Interventions    Transportation Interventions Intervention Not Indicated --  Depression Interventions/Treatment  -- BTD9-7 Score <4 Follow-up Not Indicated  Financial Strain Interventions Other (Comment)  [place]veda Reasons Alphons[facility] Overall [facility] info provided] --      SDOH Screenings   Food Insecurity: No Food Insecurity (01/29/2019)  Transportation Needs: No Transportation Needs (06/24/2021)  Depression (PHQ2-9): Low Risk  (04/24/2021)  Financial Resource Strain: Medium Risk (06/24/2021)  Physical Activity: Inactive (01/29/2019)  Stress: No Stress Concern Present (01/29/2019)  Tobacco Use: Medium Risk (01/21/2022)    CCM Care Plan  Allergies  Allergen Reactions   Beta Adrenergic Blockers Other (See Comments)    Bradycardia with timolol.    Lisinopril     cough   Codeine Nausea And Vomiting    Medications Reviewed Today     Reviewed by Charlton Haws, [facility] (Pharmacist) on 01/22/22 at [phone]  Med List  Status: <None>   Medication Order Taking? Sig Documenting Provider Last Dose Status Informant  aspirin EC 81 MG tablet [id number] Yes Take 81 mg by mouth daily.  [provider] Taking Active Self           Med Note Sharene Butters   Fri Mar 19, 2016 11:48 AM)    bimatoprost (LUMIGAN) 0.01 % SOLN [id number] Yes Place 1 drop into both eyes at bedtime. [provider] Taking Active   busPIRone (BUSPAR) 10 MG tablet [id number] Yes Take 1 tablet (10 mg total) by mouth 2 (two) times daily. Tonia Ghent, MD Taking Active   doxazosin (CARDURA) 1 MG tablet [id number] Yes Take 2 tablets (2 mg total) by mouth daily. With extra tab daily if BP is still above 140/90.  Max 3 tabs per day. Tonia Ghent, MD Taking Active   losartan (COZAAR) 100 MG tablet [id number] Yes Take 1 tablet (100 mg total) by mouth daily. Tonia Ghent, MD Taking Active   psyllium (METAMUCIL) 58.6 % packet [id number] Yes Take 1 packet by mouth daily. [provider] Taking Active   rivaroxaban (XARELTO) 20 MG TABS tablet [id number] Yes Take 1 tablet (20 mg total) by mouth every evening. Tonia Ghent, MD Taking Active   tamsulosin [facility]) 0.4 MG CAPS capsule [id number] Yes Take 0.4 mg by mouth. [provider] Taking Active             Patient Active Problem List   Diagnosis Date Noted   Dysuria 10/28/2021   Shoulder pain 10/04/2021   Anxiety 08/23/2021   Urinary retention 08/10/2021   Vision changes 04/26/2021   Vertigo 01/14/2021   Knee pain 04/14/2020   Medicare annual wellness visit, subsequent 02/14/2020   Bradycardia 10/10/2018   Glaucoma 10/10/2018   Plantar fasciitis 01/18/2018   FH: prostate cancer 01/16/2018   Chronic anticoagulation 09/13/2017   Health care maintenance 01/12/2017   Advance care planning 01/12/2017   Benign prostatic hyperplasia 09/04/2015   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   Trigeminal  neuralgia of left side of face 09/10/2014   Factor 5 Leiden  mutation, heterozygous ([place]) 06/02/2014   Pain in joint, ankle and foot 10/21/2013   History of DVT in adulthood 10/12/2013   Erectile dysfunction 04/23/2013   HYPERLIPIDEMIA 01/31/2007   HERPES SIMPLEX, UNCOMPLICATED 79/89/2119   TREMOR, ESSENTIAL 06/24/2006   Essential hypertension 06/24/2006   GERD 06/24/2006   MIGRAINE, COMMON W/O INTRACTABLE MIGRAINE 04/05/1996    Immunization History  Administered Date(s) Administered   Fluad Quad(high Dose 65+) 02/09/2019, 01/12/2021, 12/17/2021   H1N1 05/07/2008   Influenza Whole 01/16/2006, 01/31/2007, 02/07/2008, 02/10/2009, 01/24/2010   Influenza, High Dose Seasonal PF 01/04/2017, 01/16/2018, 01/23/2020   Influenza,inj,Quad PF,6+ Mos 12/05/2012, 01/04/2014, 01/29/2015, 01/06/2016   PFIZER(Purple Top)SARS-COV-2 Vaccination 05/29/2019, 06/19/2019, 01/23/2020, 01/20/2021   Pneumococcal Conjugate-13 01/04/2017   Pneumococcal Polysaccharide-23 04/23/2013, 10/13/2013   Td 04/05/1994, 06/04/2002, 04/14/2012   Tdap 02/04/2015   Zoster Recombinat (Shingrix) 01/31/2019, 04/09/2019    Conditions to be addressed/monitored:  Hypertension, Hyperlipidemia, and Hypercoaguable disease  Care Plan : [place]  Updates made by Charlton Haws, [place] since 01/22/2022 12:00 AM     Problem: Hypertension, Hyperlipidemia, and Hypercoaguable disease   Priority: High     Long-Range Goal: Disease mgmt   Start Date: 06/24/2021  Expected End Date: 01/23/2023  This Visit's Progress: On track  Recent Progress: On track  Priority: High  Note:   Current Barriers:  None identified  Pharmacist Clinical Goal(s):  Patient will contact provider office for questions/concerns as evidenced notation of same in electronic health record through collaboration with PharmD and provider.   Interventions: 1:1 collaboration with Tonia Ghent, MD regarding development and update of comprehensive plan of care as evidenced by provider attestation and  co-signature Inter-disciplinary care team collaboration (see longitudinal plan of care) Comprehensive medication review performed; medication list updated in electronic medical record  Hypertension (BP goal <140/90) -Controlled - per pt report, home BP at goal -Current home BP readings: 120s-130s/70-80s; denies s/sx of hypotension -Current treatment: Losartan 100 mg daily - Appropriate, Effective, Safe, Accessible Doxazosin 1 mg BID -Appropriate, Effective, Safe, Accessible -Medications previously tried: lisinopril (cough), timolol (bradycardia) -Educated on BP goals and benefits of medications for prevention of heart attack, stroke and kidney damage; Importance of home blood pressure monitoring; -Reviewed drug interaction between doxazosin and tamsulosin - can induce hypotension, pt denies issues, reasonable to continue -Counseled to monitor BP at home daily -Recommended to continue current medication  Hyperlipidemia / ASCVD risk asessment (LDL goal < 100) -Not ideally controlled - 10-year ASCVD risk is 25% (high-risk); pt is not on a statin.  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 2nd clot 11/2014. Indefinite anticoagulation. -Current treatment  Xarelto 20 mg daily - Appropriate, Effective, Safe, Accessible -Medications previously tried: n/a -Previously provided patient with contact info for [place] assistance during donut hole ($240 per 3 months)  -Recommended  to continue current medication  BPH (Goal: improve urination) -Recovering from TURP procedure, has urology appt next week -Current treatment: Tamsulosin 0.4 mg daily -  Query appropriate Doxazosin 1 mg BID - Appropriate, Effective, Safe, Accessible -Medications previously tried: n/a -Reviewed drug interaction - tamsulosin and doxazosin are both alpha blockers and combination therapy can lead to hypotension, pt denies s/sx of hypotension and BP has been stable; reasonable to continue both medications for now -Recommended to continue current medication  Health Maintenance -Vaccine gaps: covid booster -Current therapy:  Bimatoprost 0.01% eye drops Metamucil -Recommended to continue current medication  Patient Goals/Self-Care Activities Patient will:  - take medications as prescribed as evidenced by patient report and record review focus on medication adherence by routine check blood pressure 1-2x weekly, document, and provide at future appointments collaborate with provider on medication access solutions (Xarelto)        Medication Assistance:  Xarelto - pt given info for [employer] (discounted copay for donut hole)  Compliance/Adherence/Medication fill history: Care Gaps: None  Star-Rating Drugs: Losartan - PDC 98%  Medication Access: Within the past 30 days, how often has patient missed a dose of medication? 0 Is a pillbox or other method used to improve adherence? Yes  Factors that may affect medication adherence? no barriers identified Are meds synced by current pharmacy? No  Are meds delivered by current pharmacy? No  Does patient experience delays in picking up medications due to transportation concerns? No   [facility] Services Reviewed: Is patient disadvantaged to use [facility]?: Yes  Current Rx insurance plan: [place] Name and location of Current pharmacy:  [facility] ([place]), [place] - [address] [place] ([place]) [place] [address] Phone: [phone] Fax: [phone]  [facility] services reviewed with patient today?: No  Patient requests to transfer care to [facility]?: No  Reason patient declined to change pharmacies: Disadvantaged due to insurance/mail order   Care Plan and Follow Up Patient Decision:  Patient agrees to Care Plan and Follow-up.  Plan: The patient has been provided with contact information for the care management team and has been advised to call with any health related questions or concerns.   [name], PharmD, BCACP Clinical Pharmacist [facility] at [facility] [phone]

## 2022-01-21 NOTE — Progress Notes (Signed)
Inpatient f/u from [facility].  Using leg bag with foley cath.  He is on doxazosin and flomax. He has urology f/u pending Monday for voiding trial.  No blood in bag today.  No fevers.  No chills.  Recent HGB 13, d/w pt.  No abd pain.  No fevers.  No blood in bag in the last 4-5 days.    BP has been controlled.    D/w pt about upcoming eye surgery.  It makes sense to get prostate issue resolved and then go from there.  She agrees.  Meds, vitals, and allergies reviewed.   ROS: Per HPI unless specifically indicated in ROS section   GEN: nad, alert and oriented HEENT: ncat NECK: supple w/o LA CV: rrr.   PULM: ctab, no inc wob ABD: soft, +bs EXT: no edema SKIN: Well-perfused. Leg bag with clear urine.

## 2022-01-22 ENCOUNTER — Ambulatory Visit: Payer: Medicare Other | Admitting: Pharmacist

## 2022-01-22 DIAGNOSIS — E7800 Pure hypercholesterolemia, unspecified: Secondary | ICD-10-CM

## 2022-01-22 DIAGNOSIS — N400 Enlarged prostate without lower urinary tract symptoms: Secondary | ICD-10-CM

## 2022-01-22 DIAGNOSIS — I10 Essential (primary) hypertension: Secondary | ICD-10-CM

## 2022-01-22 DIAGNOSIS — D6851 Activated protein C resistance: Secondary | ICD-10-CM

## 2022-01-22 NOTE — Patient Instructions (Signed)
Visit Information  Phone number for Pharmacist: [phone]   Goals Addressed   None     Care Plan : [place]  Updates made by Charlton Haws, RPH since 01/22/2022 12:00 AM     Problem: Hypertension, Hyperlipidemia, and Hypercoaguable disease   Priority: High     Long-Range Goal: Disease mgmt   Start Date: 06/24/2021  Expected End Date: 01/23/2023  This Visit's Progress: On track  Recent Progress: On track  Priority: High  Note:   Current Barriers:  None identified  Pharmacist Clinical Goal(s):  Patient will contact provider office for questions/concerns as evidenced notation of same in electronic health record through collaboration with PharmD and provider.   Interventions: 1:1 collaboration with Tonia Ghent, MD regarding development and update of comprehensive plan of care as evidenced by provider attestation and co-signature Inter-disciplinary care team collaboration (see longitudinal plan of care) Comprehensive medication review performed; medication list updated in electronic medical record  Hypertension (BP goal <140/90) -Controlled - per pt report, home BP at goal -Current home BP readings: 120s-130s/70-80s; denies s/sx of hypotension -Current treatment: Losartan 100 mg daily - Appropriate, Effective, Safe, Accessible Doxazosin 1 mg BID -Appropriate, Effective, Safe, Accessible -Medications previously tried: lisinopril (cough), timolol (bradycardia) -Educated on BP goals and benefits of medications for prevention of heart attack, stroke and kidney damage; Importance of home blood pressure monitoring; -Reviewed drug interaction between doxazosin and tamsulosin - can induce hypotension, pt denies issues, reasonable to continue -Counseled to monitor BP at home daily -Recommended to continue current medication  Hyperlipidemia / ASCVD risk asessment (LDL goal < 100) -Not ideally controlled - 10-year ASCVD risk is 25% (high-risk); pt is not on  a statin.  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 2nd clot 11/2014. Indefinite anticoagulation. -Current treatment  Xarelto 20 mg daily - Appropriate, Effective, Safe, Accessible -Medications previously tried: n/a -Previously provided patient with contact info for [employer] - copay assistance during donut hole ($240 per 3 months)  -Recommended to continue current medication  BPH (Goal: improve urination) -Recovering from TURP procedure, has urology appt next week -Current treatment: Tamsulosin 0.4 mg daily - Query appropriate Doxazosin 1 mg BID - Appropriate, Effective, Safe, Accessible -Medications previously tried: n/a -Reviewed drug interaction - tamsulosin and doxazosin are both alpha blockers and combination therapy can lead to hypotension, pt denies s/sx of hypotension and BP has been stable; reasonable to continue both medications for now -Recommended to continue current medication  Health Maintenance -Vaccine gaps: covid booster -Current therapy:  Bimatoprost 0.01% eye drops Metamucil -Recommended to continue current medication  Patient Goals/Self-Care Activities Patient will:  - take medications as prescribed as evidenced by patient report and record review focus on medication adherence by routine check blood pressure 1-2x weekly, document, and provide at future appointments collaborate with provider on medication access solutions (Xarelto)       The  patient verbalized understanding of instructions, educational materials, and care plan provided today and DECLINED offer to receive copy of patient instructions, educational materials, and care plan.  Telephone follow up appointment with pharmacy team member scheduled for: PRN  [name], PharmD, BCACP Clinical Pharmacist [facility] at [facility] [phone]

## 2022-01-24 NOTE — Assessment & Plan Note (Signed)
He has urology follow-up pending for voiding trial.  Continue doxazosin is on Flomax for now.  Fortunately the urine in his bag is clear.  We talked about his upcoming eye surgery. He will update me Tuesday after his urology appointment on Monday.   We'll make plans with the eye clinic at that point.   I will await his update.

## 2022-01-25 ENCOUNTER — Telehealth: Payer: Self-pay | Admitting: Family Medicine

## 2022-01-25 DIAGNOSIS — Z466 Encounter for fitting and adjustment of urinary device: Secondary | ICD-10-CM | POA: Diagnosis not present

## 2022-01-25 DIAGNOSIS — R339 Retention of urine, unspecified: Secondary | ICD-10-CM | POA: Diagnosis not present

## 2022-01-25 NOTE — Telephone Encounter (Signed)
Patient would like a phone call regarding his hospital visit per Dr Carmelina Dane told him to call him to let him to give a f/u.

## 2022-01-26 ENCOUNTER — Inpatient Hospital Stay: Payer: Medicare Other | Admitting: Family Medicine

## 2022-01-26 NOTE — Telephone Encounter (Signed)
Patient called in to give [place] update from getting his catheter removed yesterday. Patient states he is voiding just fine and feels good.

## 2022-01-27 NOTE — Telephone Encounter (Signed)
Called Dr. Zenia Resides eye clinic about his upcoming procedure.  Will await callback.

## 2022-01-31 NOTE — Telephone Encounter (Signed)
Please update patient.  I talked with Dr. Katy Fitch about his situation.  We agreed that it makes sense for the patient to hold Xarelto on the day before the procedure and also the day of the procedure.  He could restart the day after.  I think that timeline makes sense and it does not appear that the patient would need a Lovenox bridge given the short interval off the medication.  He is scheduled for 02/11/22 and again on 03/04/22.  He would hold Xarelto on 11/8 and 11/9.  Restart on 11/10.  He would again hold on 11/29 and 11/30.  Restarted on 12/1.  Let me know if the patient has concerns or questions.  I filled out his forms in the meantime.  Thanks.

## 2022-02-01 ENCOUNTER — Telehealth: Payer: Self-pay | Admitting: Family Medicine

## 2022-02-01 NOTE — Telephone Encounter (Signed)
N/A when calling to schedule AWV with [facility].

## 2022-02-01 NOTE — Telephone Encounter (Signed)
Called patient and reviewed all information. Patient verbalized understanding, repeated back to me the dates in which he is to hold off on taking the Xarelto.  Will call if any further questions.

## 2022-02-02 ENCOUNTER — Other Ambulatory Visit: Payer: Self-pay | Admitting: Family Medicine

## 2022-02-02 DIAGNOSIS — I10 Essential (primary) hypertension: Secondary | ICD-10-CM

## 2022-02-08 ENCOUNTER — Other Ambulatory Visit (INDEPENDENT_AMBULATORY_CARE_PROVIDER_SITE_OTHER): Payer: Medicare Other

## 2022-02-08 DIAGNOSIS — I10 Essential (primary) hypertension: Secondary | ICD-10-CM

## 2022-02-08 LAB — LIPID PANEL
Cholesterol: 178 mg/dL (ref 0–200)
HDL: 69.3 mg/dL (ref >39.00)
LDL Cholesterol: 87 mg/dL (ref 0–99)
NonHDL: 109.03
Total CHOL/HDL Ratio: 3
Triglycerides: 109 mg/dL (ref 0.0–149.0)
VLDL: 21.8 mg/dL (ref 0.0–40.0)

## 2022-02-08 LAB — COMPREHENSIVE METABOLIC PANEL
ALT: 16 U/L (ref 0–53)
AST: 15 U/L (ref 0–37)
Albumin: 4.6 g/dL (ref 3.5–5.2)
Alkaline Phosphatase: 74 U/L (ref 39–117)
BUN: 11 mg/dL (ref 6–23)
CO2: 31 mEq/L (ref 19–32)
Calcium: 9.7 mg/dL (ref 8.4–10.5)
Chloride: 104 mEq/L (ref 96–112)
Creatinine, Ser: 1 mg/dL (ref 0.40–1.50)
GFR: 74.01 mL/min (ref >60.00)
Glucose, Bld: 94 mg/dL (ref 70–99)
Potassium: 4.6 mEq/L (ref 3.5–5.1)
Sodium: 142 mEq/L (ref 135–145)
Total Bilirubin: 0.5 mg/dL (ref 0.2–1.2)
Total Protein: 6.8 g/dL (ref 6.0–8.3)

## 2022-02-08 LAB — CBC WITH DIFFERENTIAL/PLATELET
Basophils Absolute: 0.1 10*3/uL (ref 0.0–0.1)
Basophils Relative: 1.3 % (ref 0.0–3.0)
Eosinophils Absolute: 0.5 10*3/uL (ref 0.0–0.7)
Eosinophils Relative: 10.2 % — ABNORMAL HIGH (ref 0.0–5.0)
HCT: 43.8 % (ref 39.0–52.0)
Hemoglobin: 14.5 g/dL (ref 13.0–17.0)
Lymphocytes Relative: 23.8 % (ref 12.0–46.0)
Lymphs Abs: 1.3 10*3/uL (ref 0.7–4.0)
MCHC: 33.2 g/dL (ref 30.0–36.0)
MCV: 87.8 fl (ref 78.0–100.0)
Monocytes Absolute: 0.5 10*3/uL (ref 0.1–1.0)
Monocytes Relative: 9.6 % (ref 3.0–12.0)
Neutro Abs: 2.9 10*3/uL (ref 1.4–7.7)
Neutrophils Relative %: 55.1 % (ref 43.0–77.0)
Platelets: 214 10*3/uL (ref 150.0–400.0)
RBC: 4.98 Mil/uL (ref 4.22–5.81)
RDW: 13 % (ref 11.5–15.5)
WBC: 5.3 10*3/uL (ref 4.0–10.5)

## 2022-02-09 ENCOUNTER — Ambulatory Visit: Payer: Medicare Other | Admitting: Family Medicine

## 2022-02-11 DIAGNOSIS — H2511 Age-related nuclear cataract, right eye: Secondary | ICD-10-CM | POA: Diagnosis not present

## 2022-02-11 DIAGNOSIS — H2181 Floppy iris syndrome: Secondary | ICD-10-CM | POA: Diagnosis not present

## 2022-02-15 ENCOUNTER — Encounter: Payer: Self-pay | Admitting: Family Medicine

## 2022-02-15 ENCOUNTER — Ambulatory Visit (INDEPENDENT_AMBULATORY_CARE_PROVIDER_SITE_OTHER): Payer: Medicare Other | Admitting: *Deleted

## 2022-02-15 ENCOUNTER — Ambulatory Visit (INDEPENDENT_AMBULATORY_CARE_PROVIDER_SITE_OTHER): Payer: Medicare Other | Admitting: Family Medicine

## 2022-02-15 VITALS — BP 118/80 | HR 76 | Temp 97.6°F | Ht 72.0 in | Wt 193.0 lb

## 2022-02-15 DIAGNOSIS — Z7901 Long term (current) use of anticoagulants: Secondary | ICD-10-CM | POA: Diagnosis not present

## 2022-02-15 DIAGNOSIS — I10 Essential (primary) hypertension: Secondary | ICD-10-CM | POA: Diagnosis not present

## 2022-02-15 DIAGNOSIS — F419 Anxiety disorder, unspecified: Secondary | ICD-10-CM | POA: Diagnosis not present

## 2022-02-15 DIAGNOSIS — Z7189 Other specified counseling: Secondary | ICD-10-CM

## 2022-02-15 DIAGNOSIS — Z0000 Encounter for general adult medical examination without abnormal findings: Secondary | ICD-10-CM

## 2022-02-15 DIAGNOSIS — N400 Enlarged prostate without lower urinary tract symptoms: Secondary | ICD-10-CM | POA: Diagnosis not present

## 2022-02-15 MED ORDER — DOXAZOSIN MESYLATE 1 MG PO TABS: 2.0000 mg | ORAL_TABLET | Freq: Every day | ORAL | 3 refills | Status: DC

## 2022-02-15 MED ORDER — RIVAROXABAN 20 MG PO TABS: 20.0000 mg | ORAL_TABLET | Freq: Every evening | ORAL | 3 refills | Status: DC

## 2022-02-15 MED ORDER — LOSARTAN POTASSIUM 100 MG PO TABS: 100.0000 mg | ORAL_TABLET | Freq: Every day | ORAL | 3 refills | Status: DC

## 2022-02-15 MED ORDER — TAMSULOSIN HCL 0.4 MG PO CAPS: 0.4000 mg | ORAL_CAPSULE | Freq: Every day | ORAL | 3 refills | Status: DC

## 2022-02-15 MED ORDER — BUSPIRONE HCL 10 MG PO TABS: 10.0000 mg | ORAL_TABLET | Freq: Two times a day (BID) | ORAL | 3 refills | Status: DC

## 2022-02-15 NOTE — Progress Notes (Unsigned)
He had his R eye surgery with L eye pending.  He held his xarelto as planned, then restarted.  No troubles with bleeding.    Hypertension:    Using medication without problems or lightheadedness: yes Chest pain with exertion:no Edema:no Short of breath:no D/w pt about doxazosin dose, usually taking '2mg'$  a day.  '100mg'$  losartan daily  Mood is good.  Taking buspar at baseline.  Still with some anxiety but better than prior.   Prostate hx d/w pt.  He has urology f/u pending.  No burning with urination now.  Still with some blood in urine, noted intermittently, most recently 02/12/22.  Hematuria is improving.  He isn't having to cath and he is still continent.  He can void up to 447m per void now.  He couldn't do that prior w/o cath.    He is fishing for fun, d/w pt.    Shingrix d/w pt.  He'll get 2nd dose later on. Flu shot 2023 PNA up to date.   Tetanus 2016 Covid prev done.   Prev intermittent smoking, <30 year pack hx, wouldn't qualify for lung CA screening program.   D/w pt.  He understood.   Colonoscopy 2019 PSA deferred 2023.  D/w pt.   Advance directive- daughter LWillette Clusterdesignated if patient were incapacitated.  AAA screening not due given prev CT abd/pelvis in 2016  Discussed getting through his next eye surgery and then considering covid booster.    Meds, vitals, and allergies reviewed.   ROS: Per HPI unless specifically indicated in ROS section   GEN: nad, alert and oriented HEENT: ncat NECK: supple w/o LA CV: rrr PULM: ctab, no inc wob ABD: soft, +bs EXT: no edema SKIN: no acute rash

## 2022-02-15 NOTE — Patient Instructions (Addendum)
Don't change your meds for now and update me as needed.  Take care.  Glad to see you. Plan on recheck in Jan 2024, sooner if needed.   We can see about BP and flomax options at that point.
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
## 2022-02-15 NOTE — Patient Instructions (Signed)
[name] , Thank you for taking time to come for your Medicare Wellness Visit. I appreciate your ongoing commitment to your health goals. Please review the following plan we discussed and let me know if I can assist you in the future.   These are the goals we discussed:  Goals      Increase physical activity     Starting 01/16/2018, I will continue to do yard work for 6-8 hours 3 days per week.      Manage My Medicine     Timeframe:  Long-Range Goal Priority:  Medium Start Date:     06/24/21                        Expected End Date:     06/25/22                  Follow Up Date Oct 2023   - call for medicine refill 2 or 3 days before it runs out - call if I am sick and can't take my medicine - keep a list of all the medicines I take; vitamins and herbals too    Why is this important?   These steps will help you keep on track with your medicines.   Notes:      Patient Stated     01/29/2019, I will maintain and continue medications as prescribed.      Patient Stated     Continue current lifestyle        This is a list of the screening recommended for you and due dates:  Health Maintenance  Topic Date Due   COVID-19 Vaccine (5 - Pfizer series) 03/17/2021   Colon Cancer Screening  11/29/2022   Medicare Annual Wellness Visit  02/16/2023   Tetanus Vaccine  02/03/2025   Pneumonia Vaccine  Completed   Flu Shot  Completed   Hepatitis C Screening: USPSTF Recommendation to screen - Ages 18-79 yo.  Completed   Zoster (Shingles) Vaccine  Completed   HPV Vaccine  Aged Out    Advanced directives: Education provided  Conditions/risks identified:     Preventive Care 47 Years and Older, Male  Preventive care refers to lifestyle choices and visits with your health care provider that can promote health and wellness. What does preventive care include? A yearly physical exam. This is also called an annual well check. Dental exams once or twice a year. Routine eye exams. Ask  your health care provider how often you should have your eyes checked. Personal lifestyle choices, including: Daily care of your teeth and gums. Regular physical activity. Eating a healthy diet. Avoiding tobacco and drug use. Limiting alcohol use. Practicing safe sex. Taking low doses of aspirin every day. Taking vitamin and mineral supplements as recommended by your health care provider. What happens during an annual well check? The services and screenings done by your health care provider during your annual well check will depend on your age, overall health, lifestyle risk factors, and family history of disease. Counseling  Your health care provider may ask you questions about your: Alcohol use. Tobacco use. Drug use. Emotional well-being. Home and relationship well-being. Sexual activity. Eating habits. History of falls. Memory and ability to understand (cognition). Work and work Statistician. Screening  You may have the following tests or measurements: Height, weight, and BMI. Blood pressure. Lipid and cholesterol levels. These may be checked every 5 years, or more frequently if you are over 31 years old.  Skin check. Lung cancer screening. You may have this screening every year starting at age 8 if you have a 30-pack-year history of smoking and currently smoke or have quit within the past 15 years. Fecal occult blood test (FOBT) of the stool. You may have this test every year starting at age 55. Flexible sigmoidoscopy or colonoscopy. You may have a sigmoidoscopy every 5 years or a colonoscopy every 10 years starting at age 46. Prostate cancer screening. Recommendations will vary depending on your family history and other risks. Hepatitis C blood test. Hepatitis B blood test. Sexually transmitted disease (STD) testing. Diabetes screening. This is done by checking your blood sugar (glucose) after you have not eaten for a while (fasting). You may have this done every 1-3  years. Abdominal aortic aneurysm (AAA) screening. You may need this if you are a current or former smoker. Osteoporosis. You may be screened starting at age 51 if you are at high risk. Talk with your health care provider about your test results, treatment options, and if necessary, the need for more tests. Vaccines  Your health care provider may recommend certain vaccines, such as: Influenza vaccine. This is recommended every year. Tetanus, diphtheria, and acellular pertussis (Tdap, Td) vaccine. You may need a Td booster every 10 years. Zoster vaccine. You may need this after age 64. Pneumococcal 13-valent conjugate (PCV13) vaccine. One dose is recommended after age 37. Pneumococcal polysaccharide (PPSV23) vaccine. One dose is recommended after age 83. Talk to your health care provider about which screenings and vaccines you need and how often you need them. This information is not intended to replace advice given to you by your health care provider. Make sure you discuss any questions you have with your health care provider. Document Released: 04/18/2015 Document Revised: 12/10/2015 Document Reviewed: 01/21/2015 Elsevier Interactive Patient Education  2017 [place] Prevention in the Home Falls can cause injuries. They can happen to people of all ages. There are many things you can do to make your home safe and to help prevent falls. What can I do on the outside of my home? Regularly fix the edges of walkways and driveways and fix any cracks. Remove anything that might make you trip as you walk through a door, such as a raised step or threshold. Trim any bushes or trees on the path to your home. Use bright outdoor lighting. Clear any walking paths of anything that might make someone trip, such as rocks or tools. Regularly check to see if handrails are loose or broken. Make sure that both sides of any steps have handrails. Any raised decks and porches should have guardrails on the  edges. Have any leaves, snow, or ice cleared regularly. Use sand or salt on walking paths during winter. Clean up any spills in your garage right away. This includes oil or grease spills. What can I do in the bathroom? Use night lights. Install grab bars by the toilet and in the tub and shower. Do not use towel bars as grab bars. Use non-skid mats or decals in the tub or shower. If you need to sit down in the shower, use a plastic, non-slip stool. Keep the floor dry. Clean up any water that spills on the floor as soon as it happens. Remove soap buildup in the tub or shower regularly. Attach bath mats securely with double-sided non-slip rug tape. Do not have throw rugs and other things on the floor that can make you trip. What can I do in  the bedroom? Use night lights. Make sure that you have a light by your bed that is easy to reach. Do not use any sheets or blankets that are too big for your bed. They should not hang down onto the floor. Have a firm chair that has side arms. You can use this for support while you get dressed. Do not have throw rugs and other things on the floor that can make you trip. What can I do in the kitchen? Clean up any spills right away. Avoid walking on wet floors. Keep items that you use a lot in easy-to-reach places. If you need to reach something above you, use a strong step stool that has a grab bar. Keep electrical cords out of the way. Do not use floor polish or wax that makes floors slippery. If you must use wax, use non-skid floor wax. Do not have throw rugs and other things on the floor that can make you trip. What can I do with my stairs? Do not leave any items on the stairs. Make sure that there are handrails on both sides of the stairs and use them. Fix handrails that are broken or loose. Make sure that handrails are as long as the stairways. Check any carpeting to make sure that it is firmly attached to the stairs. Fix any carpet that is loose or  worn. Avoid having throw rugs at the top or bottom of the stairs. If you do have throw rugs, attach them to the floor with carpet tape. Make sure that you have a light switch at the top of the stairs and the bottom of the stairs. If you do not have them, ask someone to add them for you. What else can I do to help prevent falls? Wear shoes that: Do not have high heels. Have rubber bottoms. Are comfortable and fit you well. Are closed at the toe. Do not wear sandals. If you use a stepladder: Make sure that it is fully opened. Do not climb a closed stepladder. Make sure that both sides of the stepladder are locked into place. Ask someone to hold it for you, if possible. Clearly mark and make sure that you can see: Any grab bars or handrails. First and last steps. Where the edge of each step is. Use tools that help you move around (mobility aids) if they are needed. These include: Canes. Walkers. Scooters. Crutches. Turn on the lights when you go into a dark area. Replace any light bulbs as soon as they burn out. Set up your furniture so you have a clear path. Avoid moving your furniture around. If any of your floors are uneven, fix them. If there are any pets around you, be aware of where they are. Review your medicines with your doctor. Some medicines can make you feel dizzy. This can increase your chance of falling. Ask your doctor what other things that you can do to help prevent falls. This information is not intended to replace advice given to you by your health care provider. Make sure you discuss any questions you have with your health care provider. Document Released: 01/16/2009 Document Revised: 08/28/2015 Document Reviewed: 04/26/2014 Elsevier Interactive Patient Education  2017 [employer].

## 2022-02-17 NOTE — Assessment & Plan Note (Signed)
Mood is good.  Would continue BuSpar.

## 2022-02-17 NOTE — Assessment & Plan Note (Signed)
Continue doxazosin and losartan.  Labs discussed with patient.

## 2022-02-17 NOTE — Assessment & Plan Note (Addendum)
He has urology f/u pending.  No burning with urination now.  Still with some blood in urine, noted intermittently, most recently 02/12/22.  Hematuria is improving.  He isn't having to cath and he is still continent.  He can void up to 451m per void now.  He couldn't do that prior w/o cath.  Continue Flomax for now and he will follow-up with urology.  He may be able to taper Flomax versus doxazosin in the future but I would continue as is for now.

## 2022-02-17 NOTE — Assessment & Plan Note (Signed)
Advance directive- daughter [name] designated if patient were incapacitated.

## 2022-02-17 NOTE — Assessment & Plan Note (Signed)
See above regarding anticoagulation.  He tolerated his surgery well.

## 2022-02-17 NOTE — Assessment & Plan Note (Signed)
Shingrix d/w pt.  He'll get 2nd dose later on. Flu shot 2023 PNA up to date.   Tetanus 2016 Covid prev done.   Prev intermittent smoking, <30 year pack hx, wouldn't qualify for lung CA screening program.   D/w pt.  He understood.   Colonoscopy 2019 PSA deferred 2023.  D/w pt.   Advance directive- daughter [name] designated if patient were incapacitated.  AAA screening not due given prev CT abd/pelvis in 2016

## 2022-03-01 DIAGNOSIS — H2512 Age-related nuclear cataract, left eye: Secondary | ICD-10-CM | POA: Diagnosis not present

## 2022-03-04 DIAGNOSIS — I10 Essential (primary) hypertension: Secondary | ICD-10-CM | POA: Diagnosis not present

## 2022-03-04 DIAGNOSIS — N400 Enlarged prostate without lower urinary tract symptoms: Secondary | ICD-10-CM | POA: Diagnosis not present

## 2022-03-04 DIAGNOSIS — H401121 Primary open-angle glaucoma, left eye, mild stage: Secondary | ICD-10-CM | POA: Diagnosis not present

## 2022-03-04 DIAGNOSIS — H2512 Age-related nuclear cataract, left eye: Secondary | ICD-10-CM | POA: Diagnosis not present

## 2022-03-04 DIAGNOSIS — R339 Retention of urine, unspecified: Secondary | ICD-10-CM | POA: Diagnosis not present

## 2022-03-12 ENCOUNTER — Ambulatory Visit (INDEPENDENT_AMBULATORY_CARE_PROVIDER_SITE_OTHER): Payer: Medicare Other | Admitting: Family Medicine

## 2022-03-12 ENCOUNTER — Encounter: Payer: Self-pay | Admitting: Family Medicine

## 2022-03-12 ENCOUNTER — Telehealth: Payer: Self-pay

## 2022-03-12 VITALS — BP 138/80 | HR 85 | Temp 100.4°F | Ht 72.0 in | Wt 195.0 lb

## 2022-03-12 DIAGNOSIS — U071 COVID-19: Secondary | ICD-10-CM

## 2022-03-12 DIAGNOSIS — R051 Acute cough: Secondary | ICD-10-CM | POA: Diagnosis not present

## 2022-03-12 LAB — POC COVID19 BINAXNOW: SARS Coronavirus 2 Ag: POSITIVE — AB

## 2022-03-12 LAB — POCT INFLUENZA A/B
Influenza A, POC: NEGATIVE
Influenza B, POC: NEGATIVE

## 2022-03-12 MED ORDER — MOLNUPIRAVIR EUA 200MG CAPSULE: 4.0000 | ORAL_CAPSULE | Freq: Two times a day (BID) | ORAL | 0 refills | Status: AC

## 2022-03-12 NOTE — Patient Instructions (Signed)
Stay at home today and tomorrow.  Mask for 5 days after that when around others.  Start molnupiravir in the meantime.  4 tabs twice a day.  Rest and fluids.  Mucinex with plenty of fluid.  If short of breath then go to the ER.  Take care.  Glad to see you. Update [place] as needed.

## 2022-03-12 NOTE — Progress Notes (Signed)
Chronic Care Management Pharmacy Assistant   Name: [name]  MRN: [id number] DOB: [birth year]  Reason for Encounter: CCM (Hypertension Disease State)  Recent office visits:  02/15/22 AWV 02/15/22 Dale Stain, MD HTN Change: Tamsulosin 0.4 mg No other changes. F/U in January  Recent consult visits:  03/04/22 Dale Clarity, PA (Urology) Urinary Retention "Advised patient to start double voiding and timed voiding d/t slightly elevated PVR today" 01/25/22 Dale Majors, MD (Urology) Urinary Retention No med changes  Hospital visits: None since last CCM contact  Medications: Outpatient Encounter Medications as of 03/12/2022  Medication Sig   aspirin EC 81 MG tablet Take 81 mg by mouth daily.    bimatoprost (LUMIGAN) 0.01 % SOLN Place 1 drop into both eyes at bedtime.   busPIRone (BUSPAR) 10 MG tablet Take 1 tablet (10 mg total) by mouth 2 (two) times daily.   doxazosin (CARDURA) 1 MG tablet Take 2 tablets (2 mg total) by mouth daily. With extra tab daily if BP is still above 140/90.  Max 3 tabs per day.   losartan (COZAAR) 100 MG tablet Take 1 tablet (100 mg total) by mouth daily.   psyllium (METAMUCIL) 58.6 % packet Take 1 packet by mouth daily.   rivaroxaban (XARELTO) 20 MG TABS tablet Take 1 tablet (20 mg total) by mouth every evening.   tamsulosin (FLOMAX) 0.4 MG CAPS capsule Take 1 capsule (0.4 mg total) by mouth daily.   No facility-administered encounter medications on file as of 03/12/2022.    Recent Office Vitals: BP Readings from Last 3 Encounters:  02/15/22 118/80  01/21/22 122/79  12/17/21 130/80   Pulse Readings from Last 3 Encounters:  02/15/22 76  01/21/22 72  12/17/21 (!) 56    Wt Readings from Last 3 Encounters:  02/15/22 193 lb (87.5 kg)  01/21/22 189 lb (85.7 kg)  12/17/21 190 lb (86.2 kg)     Kidney Function Lab Results  Component Value Date/Time   CREATININE 1.00 02/08/2022 07:29 AM   CREATININE 0.96 10/12/2021 08:07 AM   GFR 74.01  02/08/2022 07:29 AM   GFRNONAA >60 10/12/2021 08:07 AM   GFRAA >60 10/09/2019 08:05 AM       Latest Ref Rng & Units 02/08/2022    7:29 AM 10/12/2021    8:07 AM 07/11/2021    5:39 PM  BMP  Glucose 70 - 99 mg/dL 94  89  106   BUN 6 - 23 mg/dL '11  17  14   '$ Creatinine 0.40 - 1.50 mg/dL 1.00  0.96  0.90   Sodium 135 - 145 mEq/L 142  140  142   Potassium 3.5 - 5.1 mEq/L 4.6  3.9  4.1   Chloride 96 - 112 mEq/L 104  108  109   CO2 19 - 32 mEq/L '31  28  27   '$ Calcium 8.4 - 10.5 mg/dL 9.7  8.9  8.9    Attempted contact with patient 3 times. Unsuccessful outreach. Will atttempt contact next month.  Current antihypertensive regimen:  Losartan 100 mg daily Doxazosin 1 mg BID   Adherence Review: Is the patient currently on ACE/ARB medication? Yes  Does the patient have >5 day gap between last estimated fill dates? No  Star Rating Drugs:  Medication:  Last Fill: Day Supply Losartan 100 mg 01/20/22 90  Care Gaps:  Annual wellness visit in la/st year? Yes 02/15/22 Most Recent BP reading: 118/80 on 02/15/22  Summary of recommendations from last [name] visit (Date:01/22/22)  Summary:  CCM F/U visit -Pt recently restarted Xarelto following TURP procedure/Lovenox bridge, he denies further bleeding issues -Reviewed drug interaction between doxazosin and tamsulosin - both alpha blockers, can lead to hypotension; his BP was normal in office and he denies dizziness/lightheadedness   Recommendations/Changes made from today's visit: -Advised to monitor for s/sx of hypotension; alert provider if any low BP   Plan: -[facility] will call patient 2 months for BP update -Pharmacist follow up televisit PRN  PCP appointment on 04/12/2022  [name], CPP notified  Marijean Niemann, [place] Assistant [phone]

## 2022-03-12 NOTE — Progress Notes (Unsigned)
Cough, congestion, fatigue since Monday; fever today. Patient has been taking mucinex, nyquil and rubbing vicks vapor rub on chest at night. Sputum has been yellow.  No wheeze.  No facial pain.  No ear pain but ears feel stuffy.  Some sinus pressure.  Hoarse.    Meds, vitals, and allergies reviewed.   ROS: Per HPI unless specifically indicated in ROS section   Sinuses not ttp B L subconjunctival hemorrhage.    Ctab  Covid positive.

## 2022-03-14 DIAGNOSIS — U071 COVID-19: Secondary | ICD-10-CM | POA: Insufficient documentation

## 2022-03-14 NOTE — Assessment & Plan Note (Signed)
Okay for outpatient follow-up. Stay at home today and tomorrow.  Mask for 5 days after that when around others.  Start molnupiravir in the meantime.  4 tabs twice a day.  Routine cautions given to patient.  Written instructions given. Rest and fluids.  Mucinex with plenty of fluid.  If short of breath then go to the ER.  Update [place] as needed.  He agrees to plan.

## 2022-03-17 ENCOUNTER — Telehealth: Payer: Self-pay | Admitting: Family Medicine

## 2022-03-17 NOTE — Telephone Encounter (Signed)
Patient called in to follow up on this request.

## 2022-03-17 NOTE — Telephone Encounter (Signed)
Patient called in and stated that he was seen last week for Covid. He stated that the medication is out and he is still not feeling 100%. He was wanting to know if Dr. Damita Dunnings could refill the molnupiravir EUA (LAGEVRIO) 200 mg CAPS capsule medication or send something else in for him to take. Please advise. Thank you!

## 2022-03-18 NOTE — Telephone Encounter (Signed)
Spoke with patient and she states he does not need the medication now. He is feeling much much better today.

## 2022-03-19 ENCOUNTER — Encounter: Payer: Self-pay | Admitting: Family Medicine

## 2022-03-19 ENCOUNTER — Ambulatory Visit (INDEPENDENT_AMBULATORY_CARE_PROVIDER_SITE_OTHER): Payer: Medicare Other | Admitting: Family Medicine

## 2022-03-19 VITALS — BP 130/80 | HR 73 | Temp 98.0°F | Ht 72.0 in | Wt 195.0 lb

## 2022-03-19 DIAGNOSIS — F419 Anxiety disorder, unspecified: Secondary | ICD-10-CM | POA: Diagnosis not present

## 2022-03-19 DIAGNOSIS — U071 COVID-19: Secondary | ICD-10-CM | POA: Diagnosis not present

## 2022-03-19 MED ORDER — ONDANSETRON HCL 4 MG PO TABS: 4.0000 mg | ORAL_TABLET | Freq: Three times a day (TID) | ORAL | 0 refills | Status: DC | PRN

## 2022-03-19 MED ORDER — BUSPIRONE HCL 10 MG PO TABS: 15.0000 mg | ORAL_TABLET | Freq: Two times a day (BID) | ORAL | Status: DC

## 2022-03-19 NOTE — Patient Instructions (Addendum)
If nauseated, then use zofran as needed.   Keep drinking plenty of fluid.  Increase buspar to '15mg'$  twice day.  See if that helps your mood.  Take care.  Glad to see you.

## 2022-03-19 NOTE — Progress Notes (Unsigned)
Covid follow up.  done with molnupiravir.  No fevers.  No cough.  Fatigue episodically.  He'll have some occ "weird feelings" with nausea.  He was with family today and patient felt better about that.  Isolation was tough for patient.    Meds, vitals, and allergies reviewed.   ROS: Per HPI unless specifically indicated in ROS section        We need to call patient and check on him on Monday.

## 2022-03-21 ENCOUNTER — Telehealth: Payer: Self-pay | Admitting: Family Medicine

## 2022-03-21 NOTE — Telephone Encounter (Signed)
Please call and get update on patient on Monday.  Let me know how he is feeling.  Thanks.

## 2022-03-21 NOTE — Assessment & Plan Note (Signed)
With some residual symptoms but his lungs are clear.  See above.  Okay for outpatient follow-up. If nauseated, then use zofran as needed.   Keep drinking plenty of fluid.  See following phone note.

## 2022-03-21 NOTE — Assessment & Plan Note (Signed)
Increase buspar to '15mg'$  twice day.  He will see if that helps his mood.

## 2022-03-22 NOTE — Telephone Encounter (Signed)
Patient states he is doing much better and thanks [place] for checking in on him.

## 2022-03-22 NOTE — Telephone Encounter (Signed)
Patient returned call,would like a cb 

## 2022-03-22 NOTE — Telephone Encounter (Signed)
LMTCB

## 2022-03-23 NOTE — Telephone Encounter (Signed)
Noted. Thanks.

## 2022-04-09 ENCOUNTER — Ambulatory Visit (INDEPENDENT_AMBULATORY_CARE_PROVIDER_SITE_OTHER): Payer: Medicare Other | Admitting: Family Medicine

## 2022-04-09 ENCOUNTER — Encounter: Payer: Self-pay | Admitting: Family Medicine

## 2022-04-09 VITALS — BP 112/70 | HR 74 | Temp 97.3°F | Ht 72.0 in | Wt 197.0 lb

## 2022-04-09 DIAGNOSIS — J3489 Other specified disorders of nose and nasal sinuses: Secondary | ICD-10-CM | POA: Diagnosis not present

## 2022-04-09 DIAGNOSIS — F419 Anxiety disorder, unspecified: Secondary | ICD-10-CM

## 2022-04-09 MED ORDER — AMOXICILLIN-POT CLAVULANATE 875-125 MG PO TABS: 1.0000 | ORAL_TABLET | Freq: Two times a day (BID) | ORAL | 0 refills | Status: DC

## 2022-04-09 MED ORDER — BUSPIRONE HCL 10 MG PO TABS: ORAL_TABLET | ORAL | 1 refills | Status: DC

## 2022-04-09 NOTE — Patient Instructions (Addendum)
Start augmentin.  Rest and fluids.  Try the higher dose of buspar.  Let me know if that isn't helping.   Take care.  Glad to see you.

## 2022-04-09 NOTE — Progress Notes (Signed)
Ongoing sinus issues; not much better and worse in the afternoons.  Persistent post nasal gtt, possible GI upset from that.  Sinus pressure.  No fevers. Facial pain, periorbital.  Prev with upper gum pain.  Rhinorrhea.    He is going to see the eye clinic today.  His distance vision is good but he has to use reading glasses.   He increased buspar to '15mg'$  BID since the last OV.  He can still occ get anxious and we talked about prn inc dose.  He is has more anxiety in the PMs.  No SI/HI.   Meds, vitals, and allergies reviewed.   ROS: Per HPI unless specifically indicated in ROS section   GEN: nad, alert and oriented HEENT: mucous membranes moist, tm w/o erythema, nasal exam w/o erythema, clear discharge noted,  OP with cobblestoning NECK: supple w/o LA CV: rrr.   PULM: ctab, no inc wob EXT: no edema SKIN: no acute rash Sinuses ttp x4

## 2022-04-10 DIAGNOSIS — J3489 Other specified disorders of nose and nasal sinuses: Secondary | ICD-10-CM | POA: Insufficient documentation

## 2022-04-10 NOTE — Assessment & Plan Note (Signed)
Okay for outpatient follow-up.  Rest and fluids.  Start Augmentin.  He agrees with plan.

## 2022-04-10 NOTE — Assessment & Plan Note (Signed)
With multiple stressors noted and discussed, illnesses, hospitalizations/surgery, etc.  He has more symptoms later in the afternoon/evening.  Discussed taking BuSpar 1.5 tabs in the a.m., 2 tabs in the p.m.  He can take an extra half tablet per day if needed.  He can update me about how that goes.

## 2022-04-12 ENCOUNTER — Ambulatory Visit: Payer: Medicare Other | Admitting: Family Medicine

## 2022-06-03 DIAGNOSIS — R339 Retention of urine, unspecified: Secondary | ICD-10-CM | POA: Diagnosis not present

## 2022-06-03 DIAGNOSIS — N29 Other disorders of kidney and ureter in diseases classified elsewhere: Secondary | ICD-10-CM | POA: Diagnosis not present

## 2022-06-08 DIAGNOSIS — K080 Exfoliation of teeth due to systemic causes: Secondary | ICD-10-CM | POA: Diagnosis not present

## 2022-07-05 ENCOUNTER — Telehealth: Payer: Self-pay | Admitting: Family Medicine

## 2022-07-05 ENCOUNTER — Encounter: Payer: Self-pay | Admitting: Family Medicine

## 2022-07-05 ENCOUNTER — Ambulatory Visit (INDEPENDENT_AMBULATORY_CARE_PROVIDER_SITE_OTHER): Payer: Medicare Other | Admitting: Family Medicine

## 2022-07-05 VITALS — BP 132/82 | HR 61 | Temp 97.5°F | Ht 72.0 in | Wt 195.0 lb

## 2022-07-05 DIAGNOSIS — K921 Melena: Secondary | ICD-10-CM

## 2022-07-05 DIAGNOSIS — F419 Anxiety disorder, unspecified: Secondary | ICD-10-CM | POA: Diagnosis not present

## 2022-07-05 LAB — COMPREHENSIVE METABOLIC PANEL
ALT: 17 U/L (ref 0–53)
AST: 16 U/L (ref 0–37)
Albumin: 4.4 g/dL (ref 3.5–5.2)
Alkaline Phosphatase: 66 U/L (ref 39–117)
BUN: 12 mg/dL (ref 6–23)
CO2: 28 mEq/L (ref 19–32)
Calcium: 9.5 mg/dL (ref 8.4–10.5)
Chloride: 108 mEq/L (ref 96–112)
Creatinine, Ser: 0.99 mg/dL (ref 0.40–1.50)
GFR: 74.7 mL/min (ref >60.00)
Glucose, Bld: 113 mg/dL — ABNORMAL HIGH (ref 70–99)
Potassium: 4.2 mEq/L (ref 3.5–5.1)
Sodium: 138 mEq/L (ref 135–145)
Total Bilirubin: 0.5 mg/dL (ref 0.2–1.2)
Total Protein: 6.7 g/dL (ref 6.0–8.3)

## 2022-07-05 LAB — CBC WITH DIFFERENTIAL/PLATELET
Basophils Absolute: 0.1 10*3/uL (ref 0.0–0.1)
Basophils Relative: 1.2 % (ref 0.0–3.0)
Eosinophils Absolute: 0.3 10*3/uL (ref 0.0–0.7)
Eosinophils Relative: 7.6 % — ABNORMAL HIGH (ref 0.0–5.0)
HCT: 44.8 % (ref 39.0–52.0)
Hemoglobin: 15 g/dL (ref 13.0–17.0)
Lymphocytes Relative: 27.5 % (ref 12.0–46.0)
Lymphs Abs: 1.1 10*3/uL (ref 0.7–4.0)
MCHC: 33.4 g/dL (ref 30.0–36.0)
MCV: 86.1 fl (ref 78.0–100.0)
Monocytes Absolute: 0.4 10*3/uL (ref 0.1–1.0)
Monocytes Relative: 9.6 % (ref 3.0–12.0)
Neutro Abs: 2.2 10*3/uL (ref 1.4–7.7)
Neutrophils Relative %: 54.1 % (ref 43.0–77.0)
Platelets: 177 10*3/uL (ref 150.0–400.0)
RBC: 5.2 Mil/uL (ref 4.22–5.81)
RDW: 13.9 % (ref 11.5–15.5)
WBC: 4.1 10*3/uL (ref 4.0–10.5)

## 2022-07-05 MED ORDER — PANTOPRAZOLE SODIUM 40 MG PO TBEC
40.0000 mg | DELAYED_RELEASE_TABLET | Freq: Two times a day (BID) | ORAL | 1 refills | Status: DC
Start: 2022-07-05 — End: 2022-11-22

## 2022-07-05 MED ORDER — CLONAZEPAM 0.5 MG PO TABS: 0.5000 mg | ORAL_TABLET | Freq: Two times a day (BID) | ORAL | 0 refills | Status: DC | PRN

## 2022-07-05 MED ORDER — BUSPIRONE HCL 10 MG PO TABS: ORAL_TABLET | ORAL | Status: DC

## 2022-07-05 NOTE — Telephone Encounter (Signed)
Patient scheduled appt at 10 am this morning with Dr. Damita Dunnings. Will discuss at visit.

## 2022-07-05 NOTE — Telephone Encounter (Signed)
Patient called in and stated that he is having issues with the busPIRone (BUSPAR) 10 MG tablet. He stated that its not helping with anything. He stated that he has a lot of stuff running through his head and needs something to calm him down. He was wanting to know if something else could be sent in for him. Please advise Thank you!

## 2022-07-05 NOTE — Progress Notes (Unsigned)
Stomach burning sensation for the last few days, with stool changes recently.  07/01/22 had insomnia.  Dec in appetite.    Black stools over the last few days.  No bleeding or bruising o/w. No vomiting. Some nausea.  Took zofran this AM w/o sig change.  Minimally lightheaded on standing for the last few days.  No CP.  Not acutely SOB.    Most recently took xarelto this AM.    Anxiety is worse in the meantime.  No help with inc in anxiety.  No SI/HI.  Discussed klonopin use- had used in the past with relief.    No nsaids.   Meds, vitals, and allergies reviewed.   ROS: Per HPI unless specifically indicated in ROS section   RRR Abd not ttp.

## 2022-07-05 NOTE — Patient Instructions (Signed)
Go to the lab on the way out.   If you have mychart we'll likely use that to update you.    Skip losartan and xarelto tomorrow.   If more lightheadedness or if chest pain/short of breath, then go to the ER/dial 911.  Update me about how you feel tomorrow.  Start taking protonix twice a day in the meantime.   Take 2 buspar twice a day and then add on klonopin if needed.  Take care.  Glad to see you.

## 2022-07-06 NOTE — Telephone Encounter (Signed)
LMTCB

## 2022-07-06 NOTE — Telephone Encounter (Signed)
I am awaiting his IFOB.  We'll update him about that when resulted.   I would restart losartan and xarelto tomorrow.    If he continues to have dark/black stools then let me know.  Thanks.

## 2022-07-06 NOTE — Telephone Encounter (Signed)
Patient called in and stated that he feeling better than he did yesterday. He stated that Dr. Damita Dunnings wanted him to call and give an update. Thank you!

## 2022-07-07 DIAGNOSIS — N29 Other disorders of kidney and ureter in diseases classified elsewhere: Secondary | ICD-10-CM | POA: Diagnosis not present

## 2022-07-07 DIAGNOSIS — R339 Retention of urine, unspecified: Secondary | ICD-10-CM | POA: Diagnosis not present

## 2022-07-07 DIAGNOSIS — N289 Disorder of kidney and ureter, unspecified: Secondary | ICD-10-CM | POA: Diagnosis not present

## 2022-07-07 DIAGNOSIS — N281 Cyst of kidney, acquired: Secondary | ICD-10-CM | POA: Diagnosis not present

## 2022-07-07 LAB — FECAL OCCULT BLOOD, IMMUNOCHEMICAL: Fecal Occult Bld: NEGATIVE

## 2022-07-07 NOTE — Assessment & Plan Note (Signed)
Discussed with patient about routine cautions.  At this point okay for outpatient follow-up.  Hold losartan and Xarelto tomorrow.  See notes on labs.  Check hemoglobin.  Awaiting IFOB.  If worsening symptoms then to ER.  At this point okay for outpatient follow-up.  He agrees with plan.  Benign abdominal exam.

## 2022-07-07 NOTE — Assessment & Plan Note (Signed)
Continue BuSpar at 20 mg twice a day but add on as needed Klonopin with routine sedation caution.  He agrees to plan.

## 2022-07-07 NOTE — Telephone Encounter (Signed)
Patient returned call to our office, was informed we would give him a call back at [phone].

## 2022-07-07 NOTE — Telephone Encounter (Signed)
Spoke with patient and advised to restart his medications; he stated that he did this morning. Results are back now and spoke with him about those as well.

## 2022-07-12 ENCOUNTER — Ambulatory Visit: Payer: Medicare Other | Admitting: Family Medicine

## 2022-07-19 ENCOUNTER — Other Ambulatory Visit: Payer: Self-pay | Admitting: Family Medicine

## 2022-07-19 NOTE — Telephone Encounter (Signed)
Refill request for clonazePAM 0.5 MG Oral Tablet   LOV - 07/05/22 Next OV - 02/18/23 Last refill - 07/05/22 #20/0

## 2022-07-23 DIAGNOSIS — N200 Calculus of kidney: Secondary | ICD-10-CM | POA: Diagnosis not present

## 2022-07-23 DIAGNOSIS — N281 Cyst of kidney, acquired: Secondary | ICD-10-CM | POA: Diagnosis not present

## 2022-07-26 DIAGNOSIS — H61001 Unspecified perichondritis of right external ear: Secondary | ICD-10-CM | POA: Diagnosis not present

## 2022-07-26 DIAGNOSIS — Z85828 Personal history of other malignant neoplasm of skin: Secondary | ICD-10-CM | POA: Diagnosis not present

## 2022-07-26 DIAGNOSIS — D0461 Carcinoma in situ of skin of right upper limb, including shoulder: Secondary | ICD-10-CM | POA: Diagnosis not present

## 2022-07-26 DIAGNOSIS — L821 Other seborrheic keratosis: Secondary | ICD-10-CM | POA: Diagnosis not present

## 2022-07-26 DIAGNOSIS — D225 Melanocytic nevi of trunk: Secondary | ICD-10-CM | POA: Diagnosis not present

## 2022-07-26 DIAGNOSIS — L570 Actinic keratosis: Secondary | ICD-10-CM | POA: Diagnosis not present

## 2022-08-06 ENCOUNTER — Encounter: Payer: Self-pay | Admitting: Family Medicine

## 2022-08-06 ENCOUNTER — Ambulatory Visit (INDEPENDENT_AMBULATORY_CARE_PROVIDER_SITE_OTHER): Payer: Medicare Other | Admitting: Family Medicine

## 2022-08-06 VITALS — BP 118/78 | HR 68 | Temp 98.5°F | Ht 72.0 in | Wt 197.0 lb

## 2022-08-06 DIAGNOSIS — F419 Anxiety disorder, unspecified: Secondary | ICD-10-CM | POA: Diagnosis not present

## 2022-08-06 MED ORDER — CLONAZEPAM 0.5 MG PO TABS: ORAL_TABLET | ORAL | 1 refills | Status: DC

## 2022-08-06 NOTE — Patient Instructions (Signed)
Keep taking buspar as is.   Try taking 1/2 tab klonopin late afternoon.  See if that helps.  You can take the other 1/2 tab if not effective.  Update me as needed.  Take care.  Glad to see you.

## 2022-08-06 NOTE — Progress Notes (Unsigned)
No more black stools in the meantime.    He had eval re: renal cyst, no f/u needed.   Mood d/w pt.  Some days, nights are better than others. He is worried about his grandson, d/w pt.  Still on scheduled twice daily buspar, prn klonopin.  Usually taking klonopin at night, he thought it helps some. He clearly feels better when he is up and active.  He has more anxiety in the later afternoon.  Discussed klonopin dosing options.    Meds, vitals, and allergies reviewed.   ROS: Per HPI unless specifically indicated in ROS section   Nad Ncat Neck supple no LA Rrr Ctab Abd soft, not ttp Extremities well-perfused. Speech normal.  Affect normal.  No tremor.

## 2022-08-07 NOTE — Assessment & Plan Note (Signed)
Discussed options. Keep taking buspar as is.   He can try taking 1/2 tab klonopin late afternoon.  He can see if that helps.  He can take the other 1/2 tab if not effective.  Update me as needed.  He agrees to plan.

## 2022-08-10 DIAGNOSIS — D6869 Other thrombophilia: Secondary | ICD-10-CM | POA: Diagnosis not present

## 2022-09-01 ENCOUNTER — Other Ambulatory Visit: Payer: Self-pay

## 2022-09-01 ENCOUNTER — Emergency Department (HOSPITAL_BASED_OUTPATIENT_CLINIC_OR_DEPARTMENT_OTHER)
Admission: EM | Admit: 2022-09-01 | Discharge: 2022-09-02 | Disposition: A | Discharge status: Home / Self Care | Payer: Medicare Other | Attending: Emergency Medicine | Admitting: Emergency Medicine

## 2022-09-01 ENCOUNTER — Encounter (HOSPITAL_BASED_OUTPATIENT_CLINIC_OR_DEPARTMENT_OTHER): Payer: Self-pay | Admitting: Emergency Medicine

## 2022-09-01 DIAGNOSIS — I10 Essential (primary) hypertension: Secondary | ICD-10-CM | POA: Diagnosis not present

## 2022-09-01 DIAGNOSIS — I160 Hypertensive urgency: Secondary | ICD-10-CM | POA: Insufficient documentation

## 2022-09-01 DIAGNOSIS — Z7901 Long term (current) use of anticoagulants: Secondary | ICD-10-CM | POA: Insufficient documentation

## 2022-09-01 DIAGNOSIS — Z7982 Long term (current) use of aspirin: Secondary | ICD-10-CM | POA: Diagnosis not present

## 2022-09-01 DIAGNOSIS — Z79899 Other long term (current) drug therapy: Secondary | ICD-10-CM | POA: Insufficient documentation

## 2022-09-01 LAB — CBC
HCT: 40.5 % (ref 39.0–52.0)
Hemoglobin: 13.6 g/dL (ref 13.0–17.0)
MCH: 28.8 pg (ref 26.0–34.0)
MCHC: 33.6 g/dL (ref 30.0–36.0)
MCV: 85.8 fL (ref 80.0–100.0)
Platelets: 177 10*3/uL (ref 150–400)
RBC: 4.72 MIL/uL (ref 4.22–5.81)
RDW: 12.6 % (ref 11.5–15.5)
WBC: 5.5 10*3/uL (ref 4.0–10.5)
nRBC: 0 % (ref 0.0–0.2)

## 2022-09-01 LAB — BASIC METABOLIC PANEL
Anion gap: 9 (ref 5–15)
BUN: 12 mg/dL (ref 8–23)
CO2: 25 mmol/L (ref 22–32)
Calcium: 9.1 mg/dL (ref 8.9–10.3)
Chloride: 109 mmol/L (ref 98–111)
Creatinine, Ser: 1.07 mg/dL (ref 0.61–1.24)
GFR, Estimated: >60 mL/min (ref >60)
Glucose, Bld: 96 mg/dL (ref 70–99)
Potassium: 3.5 mmol/L (ref 3.5–5.1)
Sodium: 143 mmol/L (ref 135–145)

## 2022-09-01 NOTE — Discharge Instructions (Signed)
Continue medications as previously prescribed.  Keep a record of your blood pressures at home and take this with you to your next doctor's appointment.

## 2022-09-01 NOTE — ED Provider Notes (Signed)
[place] EMERGENCY DEPARTMENT AT [facility] Provider Note   CSN: [id number] Arrival date & time: 09/01/22  2042     History  Chief Complaint  Patient presents with   Hypertension    [name] is a 77 y.o. male.  Patient is a 77 year old male with past medical history of hypertension, prior DVT, anxiety.  Patient presenting today with complaints of elevated blood pressure.  He checked his blood pressure this afternoon and got a reading of 212 systolic.  He was instructed by his primary doctor if this happens to take an extra dose of Cardura which he did.  He then presented here for evaluation at the recommendation of the nurse on-call for his primary doctor's office.  Upon presentation here blood pressure is improving somewhat.  Patient has no symptoms such as chest pain, headache, weakness, numbness, or other issues.  This has happened to him twice in the past where his blood pressure has increased for no particular reason.  He does report being under some stress recently related to an expensive upcoming home repair.  The history is provided by the patient.       Home Medications Prior to Admission medications   Medication Sig Start Date End Date Taking? Authorizing Provider  aspirin EC 81 MG tablet Take 81 mg by mouth daily.     [provider]  busPIRone (BUSPAR) 10 MG tablet 2 tabs twice a day. 07/05/22   Joaquim Nam, MD  clonazePAM (KLONOPIN) 0.5 MG tablet TAKE 1/2-1 TABLET BY MOUTH TWICE DAILY AS NEEDED FOR ANXIETY.  SEDATION CAUTION 08/06/22   Joaquim Nam, MD  doxazosin (CARDURA) 1 MG tablet Take 2 tablets (2 mg total) by mouth daily. With extra tab daily if BP is still above 140/90.  Max 3 tabs per day. 02/15/22   Joaquim Nam, MD  losartan (COZAAR) 100 MG tablet Take 1 tablet (100 mg total) by mouth daily. 02/15/22   Joaquim Nam, MD  ondansetron (ZOFRAN) 4 MG tablet Take 1 tablet (4 mg total) by mouth every 8 (eight) hours as needed for  nausea or vomiting. 03/19/22   Joaquim Nam, MD  pantoprazole (PROTONIX) 40 MG tablet Take 1 tablet (40 mg total) by mouth 2 (two) times daily. 07/05/22   Joaquim Nam, MD  psyllium (METAMUCIL) 58.6 % packet Take 1 packet by mouth daily.    [provider]  rivaroxaban (XARELTO) 20 MG TABS tablet Take 1 tablet (20 mg total) by mouth every evening. 02/15/22   Joaquim Nam, MD  tamsulosin (FLOMAX) 0.4 MG CAPS capsule Take 1 capsule (0.4 mg total) by mouth daily. 02/15/22   Joaquim Nam, MD      Allergies    Beta adrenergic blockers, Lisinopril, and Codeine    Review of Systems   Review of Systems  All other systems reviewed and are negative.   Physical Exam Updated Vital Signs BP (!) 184/95 (BP Location: Left Arm)   Pulse (!) 56   Temp 98.1 F (36.7 C)   Resp 16   SpO2 100%  Physical Exam Vitals and nursing note reviewed.  Constitutional:      General: He is not in acute distress.    Appearance: He is well-developed. He is not diaphoretic.  HENT:     Head: Normocephalic and atraumatic.  Eyes:     Extraocular Movements: Extraocular movements intact.     Pupils: Pupils are equal, round, and reactive to light.  Cardiovascular:  Rate and Rhythm: Normal rate and regular rhythm.     Heart sounds: No murmur heard.    No friction rub.  Pulmonary:     Effort: Pulmonary effort is normal. No respiratory distress.     Breath sounds: Normal breath sounds. No wheezing or rales.  Abdominal:     General: Bowel sounds are normal. There is no distension.     Palpations: Abdomen is soft.     Tenderness: There is no abdominal tenderness.  Musculoskeletal:        General: Normal range of motion.     Cervical back: Normal range of motion and neck supple.  Skin:    General: Skin is warm and dry.  Neurological:     General: No focal deficit present.     Mental Status: He is alert and oriented to person, place, and time.     Cranial Nerves: No cranial nerve deficit.      Coordination: Coordination normal.     ED Results / Procedures / Treatments   Labs (all labs ordered are listed, but only abnormal results are displayed) Labs Reviewed  CBC  BASIC METABOLIC PANEL    EKG EKG Interpretation  Date/Time:  Wednesday Sep 01 2022 20:57:33 EDT Ventricular Rate:  57 PR Interval:  186 QRS Duration: 102 QT Interval:  412 QTC Calculation: 401 R Axis:   70 Text Interpretation: Sinus bradycardia ST & T wave abnormality, consider inferior ischemia Abnormal ECG When compared with ECG of 11-Jul-2021 17:42, No significant change was found Confirmed by Ernie Avena ([id number]) on 09/01/2022 10:23:57 PM  Radiology No results found.  Procedures Procedures    Medications Ordered in ED Medications - No data to display  ED Course/ Medical Decision Making/ A&P  Patient presenting here with complaints of elevated blood pressure as described in the HPI.  He arrives here with stable vital signs and is afebrile.  Initial blood pressure was 184/95, but was 163 at the time of my evaluation.  Physical examination is unremarkable and patient is neurologically intact.  Laboratory studies obtained including CBC and basic metabolic panel, all of which are unremarkable and show no evidence for endorgan damage.  Patient has been observed for several hours and blood pressure is improving.  At this point, I feel as though patient can safely be discharged.  I am uncertain as to why his blood pressure rapidly increased, but advised him to keep a record at home and follow-up with his primary doctor.  Final Clinical Impression(s) / ED Diagnoses Final diagnoses:  None    Rx / DC Orders ED Discharge Orders     None         Geoffery Lyons, MD 09/01/22 2349

## 2022-09-01 NOTE — ED Triage Notes (Signed)
Reports high BP 2xx/1xx at home for a few hours. Hx anxiety, Denies any chest pain sob HA

## 2022-09-02 ENCOUNTER — Telehealth: Payer: Self-pay

## 2022-09-02 ENCOUNTER — Ambulatory Visit (INDEPENDENT_AMBULATORY_CARE_PROVIDER_SITE_OTHER): Payer: Medicare Other | Admitting: Family Medicine

## 2022-09-02 ENCOUNTER — Encounter: Payer: Self-pay | Admitting: Family Medicine

## 2022-09-02 VITALS — BP 148/78 | HR 83 | Temp 97.4°F | Ht 72.0 in | Wt 207.0 lb

## 2022-09-02 DIAGNOSIS — I10 Essential (primary) hypertension: Secondary | ICD-10-CM

## 2022-09-02 DIAGNOSIS — F419 Anxiety disorder, unspecified: Secondary | ICD-10-CM | POA: Diagnosis not present

## 2022-09-02 NOTE — Progress Notes (Signed)
Hypertension:    Chest pain with exertion: no Edema:no Short of breath:no D/w pt about ER eval.  He was able to work in the yard for hours yesterday.   BP was 160/90 this AM at home.  Lower here today.   Discussed ddx for episodic HTN.   Anxiety is worse at the end of the day, late afternoon.  Unclear how much that contributes to his blood pressure variation.  He had been taking klonopin just before bed.    Meds, vitals, and allergies reviewed.   PMH and SH reviewed  ROS: Per HPI unless specifically indicated in ROS section   GEN: nad, alert and oriented HEENT: ncat NECK: supple w/o LA CV: rrr. PULM: ctab, no inc wob ABD: soft, +bs EXT: no edema SKIN: Well-perfused. Speech and judgment normal.

## 2022-09-02 NOTE — Patient Instructions (Signed)
Try taking klonopin around 5pm - sedation caution- and let me know about that next week.    Take care.  Glad to see you.  You could still take a 3rd doxazosin in the PM if needed.   If your SBP is still above 170 after 2 hours, you could take a 4th doxazosin.

## 2022-09-02 NOTE — Telephone Encounter (Signed)
Per appt notes pt already has appt with Dr Para March 09/02/22 at 3pm. Sending note to Dr Para March and Para March pool.

## 2022-09-05 NOTE — Assessment & Plan Note (Signed)
Discussed options.  He could still take a 3rd doxazosin in the PM if needed.   If SBP is still above 170 after 2 hours (after the third dose), he could take a 4th mg of doxazosin if needed.

## 2022-09-05 NOTE — Assessment & Plan Note (Signed)
Unclear how much this affects his blood pressure. Reasonable to try taking klonopin around 5pm - sedation caution- and let me know about that next week.   It may be worth considering changing to SSRI if needed in the future.

## 2022-09-06 ENCOUNTER — Encounter: Payer: Self-pay | Admitting: Gastroenterology

## 2022-09-13 DIAGNOSIS — Z961 Presence of intraocular lens: Secondary | ICD-10-CM | POA: Diagnosis not present

## 2022-09-13 DIAGNOSIS — H40011 Open angle with borderline findings, low risk, right eye: Secondary | ICD-10-CM | POA: Diagnosis not present

## 2022-09-13 DIAGNOSIS — H401121 Primary open-angle glaucoma, left eye, mild stage: Secondary | ICD-10-CM | POA: Diagnosis not present

## 2022-09-13 DIAGNOSIS — H43811 Vitreous degeneration, right eye: Secondary | ICD-10-CM | POA: Diagnosis not present

## 2022-09-14 ENCOUNTER — Encounter: Payer: Self-pay | Admitting: Gastroenterology

## 2022-09-17 ENCOUNTER — Encounter: Payer: Self-pay | Admitting: Family Medicine

## 2022-09-17 ENCOUNTER — Ambulatory Visit (INDEPENDENT_AMBULATORY_CARE_PROVIDER_SITE_OTHER): Payer: Medicare Other | Admitting: Family Medicine

## 2022-09-17 VITALS — BP 120/82 | HR 62 | Temp 97.0°F | Ht 72.0 in | Wt 198.0 lb

## 2022-09-17 DIAGNOSIS — F419 Anxiety disorder, unspecified: Secondary | ICD-10-CM

## 2022-09-17 MED ORDER — ESCITALOPRAM OXALATE 10 MG PO TABS
10.0000 mg | ORAL_TABLET | Freq: Every day | ORAL | 3 refills | Status: DC
Start: 2022-09-17 — End: 2023-09-12

## 2022-09-17 NOTE — Progress Notes (Unsigned)
He tried taking klonopin around 5PM, with PM dose of buspar.  That seemed to help some.  Still with variability from day to day.  He is clearly better when out doing something in the community.  He has more sx later in the day when alone.  He is still doing a lot of yard work- that helps.  He feels better when fishing.    BP is controlled today.   Meds, vitals, and allergies reviewed.   ROS: Per HPI unless specifically indicated in ROS section

## 2022-09-17 NOTE — Patient Instructions (Signed)
Try adding on lexapro daily. Keep taking buspar as is.   Let me know how that goes over the next few weeks.  Still use klonopin if needed.  Take care.  Glad to see you.

## 2022-09-19 NOTE — Assessment & Plan Note (Signed)
Discussed options.  Continue BuSpar.  Would try adding on lexapro daily.  Timeline for SSRI effect discussed with patient.  Routine cautions given to patient. He can let me know how that goes over the next few weeks.  Still use klonopin if needed.

## 2022-09-29 ENCOUNTER — Other Ambulatory Visit: Payer: Self-pay

## 2022-09-29 MED ORDER — BUSPIRONE HCL 10 MG PO TABS: ORAL_TABLET | ORAL | 1 refills | Status: DC

## 2022-10-13 ENCOUNTER — Inpatient Hospital Stay: Payer: Medicare Other | Attending: Internal Medicine | Admitting: Medical Oncology

## 2022-10-13 ENCOUNTER — Encounter: Payer: Self-pay | Admitting: Medical Oncology

## 2022-10-13 ENCOUNTER — Inpatient Hospital Stay: Payer: Medicare Other

## 2022-10-13 VITALS — BP 160/94 | HR 58 | Temp 98.1°F | Wt 204.0 lb

## 2022-10-13 DIAGNOSIS — D6851 Activated protein C resistance: Secondary | ICD-10-CM

## 2022-10-13 DIAGNOSIS — Z800 Family history of malignant neoplasm of digestive organs: Secondary | ICD-10-CM | POA: Diagnosis not present

## 2022-10-13 DIAGNOSIS — Z86718 Personal history of other venous thrombosis and embolism: Secondary | ICD-10-CM | POA: Diagnosis not present

## 2022-10-13 DIAGNOSIS — Z87891 Personal history of nicotine dependence: Secondary | ICD-10-CM | POA: Insufficient documentation

## 2022-10-13 DIAGNOSIS — Z806 Family history of leukemia: Secondary | ICD-10-CM | POA: Diagnosis not present

## 2022-10-13 DIAGNOSIS — Z7901 Long term (current) use of anticoagulants: Secondary | ICD-10-CM | POA: Diagnosis not present

## 2022-10-13 DIAGNOSIS — Z8042 Family history of malignant neoplasm of prostate: Secondary | ICD-10-CM | POA: Diagnosis not present

## 2022-10-13 LAB — COMPREHENSIVE METABOLIC PANEL
ALT: 19 U/L (ref 0–44)
AST: 20 U/L (ref 15–41)
Albumin: 4.4 g/dL (ref 3.5–5.0)
Alkaline Phosphatase: 65 U/L (ref 38–126)
Anion gap: 8 (ref 5–15)
BUN: 15 mg/dL (ref 8–23)
CO2: 25 mmol/L (ref 22–32)
Calcium: 9.1 mg/dL (ref 8.9–10.3)
Chloride: 108 mmol/L (ref 98–111)
Creatinine, Ser: 1.04 mg/dL (ref 0.61–1.24)
GFR, Estimated: >60 mL/min (ref >60)
Glucose, Bld: 115 mg/dL — ABNORMAL HIGH (ref 70–99)
Potassium: 4.3 mmol/L (ref 3.5–5.1)
Sodium: 141 mmol/L (ref 135–145)
Total Bilirubin: 0.6 mg/dL (ref 0.3–1.2)
Total Protein: 6.9 g/dL (ref 6.5–8.1)

## 2022-10-13 LAB — CBC WITH DIFFERENTIAL/PLATELET
Abs Immature Granulocytes: 0.04 10*3/uL (ref 0.00–0.07)
Basophils Absolute: 0.1 10*3/uL (ref 0.0–0.1)
Basophils Relative: 1 %
Eosinophils Absolute: 0.5 10*3/uL (ref 0.0–0.5)
Eosinophils Relative: 10 %
HCT: 46.9 % (ref 39.0–52.0)
Hemoglobin: 15.4 g/dL (ref 13.0–17.0)
Immature Granulocytes: 1 %
Lymphocytes Relative: 27 %
Lymphs Abs: 1.3 10*3/uL (ref 0.7–4.0)
MCH: 28.7 pg (ref 26.0–34.0)
MCHC: 32.8 g/dL (ref 30.0–36.0)
MCV: 87.5 fL (ref 80.0–100.0)
Monocytes Absolute: 0.4 10*3/uL (ref 0.1–1.0)
Monocytes Relative: 9 %
Neutro Abs: 2.6 10*3/uL (ref 1.7–7.7)
Neutrophils Relative %: 52 %
Platelets: 161 10*3/uL (ref 150–400)
RBC: 5.36 MIL/uL (ref 4.22–5.81)
RDW: 12.4 % (ref 11.5–15.5)
WBC: 5 10*3/uL (ref 4.0–10.5)
nRBC: 0 % (ref 0.0–0.2)
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
## 2022-10-13 NOTE — Addendum Note (Signed)
Addended by: Darrold Span A on: 10/13/2022 11:54 AM   Modules accepted: Orders

## 2022-10-25 ENCOUNTER — Encounter: Payer: Self-pay | Admitting: Family Medicine

## 2022-10-25 ENCOUNTER — Telehealth: Payer: Self-pay | Admitting: Family Medicine

## 2022-10-25 ENCOUNTER — Other Ambulatory Visit: Payer: Self-pay | Admitting: Interventional Radiology

## 2022-10-25 ENCOUNTER — Ambulatory Visit (INDEPENDENT_AMBULATORY_CARE_PROVIDER_SITE_OTHER): Payer: Medicare Other | Admitting: Family Medicine

## 2022-10-25 VITALS — BP 118/78 | HR 83 | Temp 97.7°F | Ht 72.0 in | Wt 201.0 lb

## 2022-10-25 DIAGNOSIS — I871 Compression of vein: Secondary | ICD-10-CM

## 2022-10-25 DIAGNOSIS — F419 Anxiety disorder, unspecified: Secondary | ICD-10-CM | POA: Diagnosis not present

## 2022-10-25 MED ORDER — CLONAZEPAM 0.5 MG PO TABS: ORAL_TABLET | ORAL | 3 refills | Status: DC

## 2022-10-25 MED ORDER — BUSPIRONE HCL 10 MG PO TABS: ORAL_TABLET | ORAL | 3 refills | Status: DC

## 2022-10-25 NOTE — Telephone Encounter (Signed)
Saw this kind patient in clinic today.  He was asking about yearly follow up with you. Please let me know if I can be of service in the meantime.  The patient and I thought it was reasonable to follow up with you.  If that is the case, please have your staff contact him about follow up.  Otherwise please advise me about options going forward.  Thanks.

## 2022-10-25 NOTE — Telephone Encounter (Signed)
App help from Dr. Loreta Ave- his clinic is going to contact patient.  I thank all involved.

## 2022-10-25 NOTE — Progress Notes (Signed)
Anxiety and depression f/u. Patient is feeling so much better since put on lexapro.  Compliant.  He clearly feels better.   Taking buspar 20mg  BID, klonopin at night, lexapro .  He feels better than last OV, since lexapro start.  "I'm not having anxiety anymore."  D/w pt about klonopin use and options.   BP controlled.  Taking losartan 100mg  and 2mg  doxazosin daily.    We talked about getting follow up with Dr. Loreta Ave.  I sent a note asking for advice/follow up, d/w pt.    Meds, vitals, and allergies reviewed.   ROS: Per HPI unless specifically indicated in ROS section   GEN: nad, alert and oriented HEENT: ncat NECK: supple w/o LA CV: rrr. PULM: ctab, no inc wob ABD: soft, +bs EXT: no edema SKIN: well perfused.

## 2022-10-25 NOTE — Patient Instructions (Addendum)
I wouldn't change anything for now.  If still doing well next month, then it would be reasonable to try taking 1/2 tab of klonopin in the afternoon instead of a whole pill.  You could see how you do on that.  You may need to increase back to a whole tab, depending on your situation and symptoms.  Let me know if you don't hear from Dr. Loreta Ave.   Take care.  Glad to see you.

## 2022-10-25 NOTE — Assessment & Plan Note (Signed)
I wouldn't change anything for now.  If still doing well next month, then it would be reasonable to try taking 1/2 tab of klonopin in the afternoon instead of a whole pill.  He may need to increase back to a whole tab, depending on his situation and symptoms. He can update me as needed. I suspect lexapro addition helped.

## 2022-10-25 NOTE — Assessment & Plan Note (Signed)
We talked about getting follow up with Dr. Loreta Ave.  I sent a note asking for advice/follow up, d/w pt.

## 2022-11-10 DIAGNOSIS — N29 Other disorders of kidney and ureter in diseases classified elsewhere: Secondary | ICD-10-CM | POA: Diagnosis not present

## 2022-11-10 DIAGNOSIS — R339 Retention of urine, unspecified: Secondary | ICD-10-CM | POA: Diagnosis not present

## 2022-11-10 DIAGNOSIS — N400 Enlarged prostate without lower urinary tract symptoms: Secondary | ICD-10-CM | POA: Diagnosis not present

## 2022-11-12 ENCOUNTER — Ambulatory Visit: Payer: Medicare Other | Admitting: Physician Assistant

## 2022-11-12 ENCOUNTER — Encounter: Payer: Self-pay | Admitting: Physician Assistant

## 2022-11-12 VITALS — BP 130/80 | HR 81 | Ht 72.0 in | Wt 203.0 lb

## 2022-11-12 DIAGNOSIS — Z7901 Long term (current) use of anticoagulants: Secondary | ICD-10-CM | POA: Diagnosis not present

## 2022-11-12 DIAGNOSIS — Z01818 Encounter for other preprocedural examination: Secondary | ICD-10-CM

## 2022-11-12 DIAGNOSIS — Z800 Family history of malignant neoplasm of digestive organs: Secondary | ICD-10-CM

## 2022-11-12 DIAGNOSIS — Z86010 Personal history of colonic polyps: Secondary | ICD-10-CM

## 2022-11-12 DIAGNOSIS — Z862 Personal history of diseases of the blood and blood-forming organs and certain disorders involving the immune mechanism: Secondary | ICD-10-CM

## 2022-11-12 DIAGNOSIS — D6851 Activated protein C resistance: Secondary | ICD-10-CM

## 2022-11-12 MED ORDER — SUTAB 1479-225-188 MG PO TABS: ORAL_TABLET | ORAL | 0 refills | Status: DC
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
## 2022-11-12 NOTE — Patient Instructions (Signed)
You have been scheduled for a colonoscopy. Please follow written instructions given to you at your visit today.   Please pick up your prep supplies at the pharmacy within the next 1-3 days.  If you use inhalers (even only as needed), please bring them with you on the day of your procedure.  DO NOT TAKE 7 DAYS PRIOR TO TEST- Trulicity (dulaglutide) Ozempic, Wegovy (semaglutide) Mounjaro (tirzepatide) Bydureon Bcise (exanatide extended release)  DO NOT TAKE 1 DAY PRIOR TO YOUR TEST Rybelsus (semaglutide) Adlyxin (lixisenatide) Victoza (liraglutide) Byetta (exanatide) ___________________________________________________________________________  _______________________________________________________  If your blood pressure at your visit was 140/90 or greater, please contact your primary care physician to follow up on this.  _______________________________________________________  If you are age 75 or older, your body mass index should be between 23-30. Your Body mass index is 27.53 kg/m. If this is out of the aforementioned range listed, please consider follow up with your Primary Care Provider.  If you are age 62 or younger, your body mass index should be between 19-25. Your Body mass index is 27.53 kg/m. If this is out of the aformentioned range listed, please consider follow up with your Primary Care Provider.   ________________________________________________________  The [facility] providers would like to encourage you to use [facility] to communicate with providers for non-urgent requests or questions.  Due to long hold times on the telephone, sending your provider a message by [facility] may be a faster and more efficient way to get a response.  Please allow 48 business hours for a response.  Please remember that this is for non-urgent requests.  _______________________________________________________

## 2022-11-15 ENCOUNTER — Encounter: Payer: Self-pay | Admitting: Gastroenterology

## 2022-11-17 ENCOUNTER — Other Ambulatory Visit (HOSPITAL_COMMUNITY): Payer: Self-pay

## 2022-11-17 ENCOUNTER — Ambulatory Visit
Admission: RE | Admit: 2022-11-17 | Discharge: 2022-11-17 | Disposition: A | Discharge status: Home / Self Care | Payer: Medicare Other | Source: Ambulatory Visit | Attending: Interventional Radiology | Admitting: Interventional Radiology

## 2022-11-17 ENCOUNTER — Telehealth: Payer: Self-pay

## 2022-11-17 DIAGNOSIS — Z95820 Peripheral vascular angioplasty status with implants and grafts: Secondary | ICD-10-CM | POA: Diagnosis not present

## 2022-11-17 DIAGNOSIS — I871 Compression of vein: Secondary | ICD-10-CM | POA: Diagnosis not present

## 2022-11-17 HISTORY — PX: IR RADIOLOGIST EVAL & MGMT: IMG5224

## 2022-11-17 NOTE — Telephone Encounter (Signed)
Pharmacy Patient Advocate Encounter   Received notification from CoverMyMeds that prior authorization for Doxazosin Mesylate 1MG  tablets is required/requested.   Insurance verification completed.   The patient is insured through [facility] .   Per test claim: PA required; PA submitted to [facility] via CoverMyMeds Key/confirmation #/EOC [id number] Status is pending
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
## 2022-11-18 NOTE — Telephone Encounter (Signed)
Approval effective dates:  8.14.24 to 8.14.25  Case No. [id number]  Megan B. [employer]  [phone] Option 5  Rep will fax over the approval

## 2022-11-20 ENCOUNTER — Other Ambulatory Visit: Payer: Self-pay | Admitting: Family Medicine

## 2022-11-22 ENCOUNTER — Ambulatory Visit (AMBULATORY_SURGERY_CENTER): Payer: Medicare Other | Admitting: Gastroenterology

## 2022-11-22 ENCOUNTER — Encounter: Payer: Self-pay | Admitting: Gastroenterology

## 2022-11-22 VITALS — BP 107/63 | HR 44 | Temp 96.9°F | Resp 19 | Ht 72.0 in | Wt 203.0 lb

## 2022-11-22 DIAGNOSIS — D120 Benign neoplasm of cecum: Secondary | ICD-10-CM | POA: Diagnosis not present

## 2022-11-22 DIAGNOSIS — Z800 Family history of malignant neoplasm of digestive organs: Secondary | ICD-10-CM | POA: Diagnosis not present

## 2022-11-22 DIAGNOSIS — K635 Polyp of colon: Secondary | ICD-10-CM | POA: Diagnosis not present

## 2022-11-22 DIAGNOSIS — Z86010 Personal history of colonic polyps: Secondary | ICD-10-CM

## 2022-11-22 MED ORDER — SODIUM CHLORIDE 0.9 % IV SOLN
500.0000 mL | Freq: Once | INTRAVENOUS | Status: DC
Start: 2022-11-22 — End: 2022-11-22

## 2022-11-22 NOTE — Progress Notes (Signed)
See 11/12/2022 H&P no changes

## 2022-11-22 NOTE — Progress Notes (Signed)
Called to room to assist during endoscopic procedure.  Patient ID and intended procedure confirmed with present staff. Received instructions for my participation in the procedure from the performing physician.  

## 2022-11-22 NOTE — Progress Notes (Signed)
Pt's states no medical or surgical changes since previsit or office visit. 

## 2022-11-22 NOTE — Progress Notes (Signed)
Sedate, gd SR, tolerated procedure well, VSS, report to RN 

## 2022-11-22 NOTE — Patient Instructions (Addendum)
- High fiber diet.                           - Continue present medications.                           - 1 polyp removed and sent to pathology.  Diverticulosis and hemorrhoids. Await pathology results.                           - No repeat colonoscopy due to age.                           - Resume Xarelto (rivaroxaban) at prior dose                            tomorrow. Refer to managing physician for further                            adjustment of therapy.  YOU HAD AN ENDOSCOPIC PROCEDURE TODAY AT THE [facility]:   Refer to the procedure report that was given to you for any specific questions about what was found during the examination.  If the procedure report does not answer your questions, please call your gastroenterologist to clarify.  If you requested that your care partner not be given the details of your procedure findings, then the procedure report has been included in a sealed envelope for you to review at your convenience later.  YOU SHOULD EXPECT: Some feelings of bloating in the abdomen. Passage of more gas than usual.  Walking can help get rid of the air that was put into your GI tract during the procedure and reduce the bloating. If you had a lower endoscopy (such as a colonoscopy or flexible sigmoidoscopy) you may notice spotting of blood in your stool or on the toilet paper. If you underwent a bowel prep for your procedure, you may not have a normal bowel movement for a few days.  Please Note:  You might notice some irritation and congestion in your nose or some drainage.  This is from the oxygen used during your procedure.  There is no need for concern and it should clear up in a day or so.  SYMPTOMS TO REPORT IMMEDIATELY:  Following lower endoscopy (colonoscopy or flexible sigmoidoscopy):  Excessive amounts of blood in the stool  Significant tenderness or worsening of abdominal pains  Swelling of the abdomen that is new, acute  Fever of  100F or higher   For urgent or emergent issues, a gastroenterologist can be reached at any hour by calling [phone]. Do not use MyChart messaging for urgent concerns.    DIET:  We do recommend a small meal at first, but then you may proceed to your regular diet.  Drink plenty of fluids but you should avoid alcoholic beverages for 24 hours.  ACTIVITY:  You should plan to take it easy for the rest of today and you should NOT DRIVE or use heavy machinery until tomorrow (because of the sedation medicines used during the test).    FOLLOW UP: Our staff will call the number listed on your records the next business day following your procedure.  We will call around 7:15- 8:00 am to check on  you and address any questions or concerns that you may have regarding the information given to you following your procedure. If we do not reach you, we will leave a message.     If any biopsies were taken you will be contacted by phone or by letter within the next 1-3 weeks.  Please call us at [phone] if you have not heard about the biopsies in 3 weeks.    SIGNATURES/CONFIDENTIALITY: You and/or your care partner have signed paperwork which will be entered into your electronic medical record.  These signatures attest to the fact that that the information above on your After Visit Summary has been reviewed and is understood.  Full responsibility of the confidentiality of this discharge information lies with you and/or your care-partner.

## 2022-11-22 NOTE — Op Note (Addendum)
[facility] Patient Name: [name] Procedure Date: 11/22/2022 11:29 AM MRN: [id number] Endoscopist: Meryl Dare , MD, [phone] Age: 77 Referring MD:  Date of Birth: 09-22-1945 Gender: Male Account #: [id number] Procedure:                Colonoscopy Indications:              Screening in patient at increased risk: Family                            history of 1st-degree relative with colorectal                            cancer Medicines:                Monitored Anesthesia Care Procedure:                Pre-Anesthesia Assessment:                           - Prior to the procedure, a History and Physical                            was performed, and patient medications and                            allergies were reviewed. The patient's tolerance of                            previous anesthesia was also reviewed. The risks                            and benefits of the procedure and the sedation                            options and risks were discussed with the patient.                            All questions were answered, and informed consent                            was obtained. Prior Anticoagulants: The patient has                            taken Xarelto (rivaroxaban), last dose was 2 days                            prior to procedure. ASA Grade Assessment: II - A                            patient with mild systemic disease. After reviewing                            the risks and benefits, the patient was deemed in  satisfactory condition to undergo the procedure.                           After obtaining informed consent, the colonoscope                            was passed under direct vision. Throughout the                            procedure, the patient's blood pressure, pulse, and                            oxygen saturations were monitored continuously. The                            Olympus Scope SN: [id number] was  introduced through                            the anus and advanced to the the cecum, identified                            by appendiceal orifice and ileocecal valve. The                            ileocecal valve, appendiceal orifice, and rectum                            were photographed. The quality of the bowel                            preparation was good. The colonoscopy was performed                            without difficulty. The patient tolerated the                            procedure well. Scope In: 11:41:03 AM Scope Out: 11:52:32 AM Scope Withdrawal Time: 0 hours 8 minutes 46 seconds  Total Procedure Duration: 0 hours 11 minutes 29 seconds  Findings:                 The perianal and digital rectal examinations were                            normal.                           A 5 mm polyp was found in the cecum. The polyp was                            sessile. The polyp was removed with a cold snare.                            Resection and retrieval were complete.  Multiple medium-mouthed and small-mouthed                            diverticula were found in the left colon.                            Peri-diverticular erythema was seen. There was                            evidence of an impacted diverticulum. There was no                            evidence of diverticular bleeding.                           External and internal hemorrhoids were found during                            retroflexion. The hemorrhoids were moderate and                            Grade I (internal hemorrhoids that do not prolapse).                           The exam was otherwise without abnormality on                            direct and retroflexion views. Complications:            No immediate complications. Estimated blood loss:                            None. Estimated Blood Loss:     Estimated blood loss: none. Impression:               - One 5 mm polyp  in the cecum, removed with a cold                            snare. Resected and retrieved.                           - Moderate diverticulosis in the left colon.                           - Internal and external hemorrhoids. Recommendation:           - Patient has a contact number available for                            emergencies. The signs and symptoms of potential                            delayed complications were discussed with the                            patient. Return to normal activities  tomorrow.                            Written discharge instructions were provided to the                            patient.                           - High fiber diet.                           - Continue present medications.                           - Await pathology results.                           - No repeat colonoscopy due to age.                           - Resume Xarelto (rivaroxaban) at prior dose                            tomorrow. Refer to managing physician for further                            adjustment of therapy. Meryl Dare, MD 11/22/2022 11:56:56 AM This report has been signed electronically.

## 2022-11-23 ENCOUNTER — Telehealth: Payer: Self-pay | Admitting: *Deleted

## 2022-11-23 NOTE — Telephone Encounter (Signed)
Attempted f/u phone call. No answer. Left message. °

## 2022-11-30 ENCOUNTER — Encounter: Payer: Self-pay | Admitting: Gastroenterology

## 2023-01-03 DIAGNOSIS — Z961 Presence of intraocular lens: Secondary | ICD-10-CM | POA: Diagnosis not present

## 2023-01-03 DIAGNOSIS — H401121 Primary open-angle glaucoma, left eye, mild stage: Secondary | ICD-10-CM | POA: Diagnosis not present

## 2023-01-03 DIAGNOSIS — H40011 Open angle with borderline findings, low risk, right eye: Secondary | ICD-10-CM | POA: Diagnosis not present

## 2023-01-03 DIAGNOSIS — H43811 Vitreous degeneration, right eye: Secondary | ICD-10-CM | POA: Diagnosis not present

## 2023-01-04 DIAGNOSIS — K080 Exfoliation of teeth due to systemic causes: Secondary | ICD-10-CM | POA: Diagnosis not present

## 2023-01-10 ENCOUNTER — Encounter: Payer: Self-pay | Admitting: Family Medicine

## 2023-01-10 ENCOUNTER — Ambulatory Visit (INDEPENDENT_AMBULATORY_CARE_PROVIDER_SITE_OTHER): Payer: Medicare Other | Admitting: Family Medicine

## 2023-01-10 VITALS — BP 144/80 | HR 62 | Temp 97.8°F | Ht 72.0 in | Wt 201.0 lb

## 2023-01-10 DIAGNOSIS — S60221A Contusion of right hand, initial encounter: Secondary | ICD-10-CM

## 2023-01-10 DIAGNOSIS — Z23 Encounter for immunization: Secondary | ICD-10-CM | POA: Diagnosis not present

## 2023-01-10 DIAGNOSIS — T148XXA Other injury of unspecified body region, initial encounter: Secondary | ICD-10-CM | POA: Diagnosis not present

## 2023-01-10 NOTE — Patient Instructions (Signed)
Keep your hand elevated and ice for 5 minutes at a time if needed.  Let me know if you have more pain or other bleeding.   Take care.  Glad to see you. Flu shot today.

## 2023-01-10 NOTE — Assessment & Plan Note (Signed)
Limited to affected extremity.  With injury to explain the findings, on xarelto, w/o other bleeding.  Would defer imaging and CBC at this point.  Should resolved slowly.  Advised to keep the hand elevated and ice for 5 minutes at a time if needed.  Let me know if more pain or other bleeding.  He agrees.    Also has flu shot done at [facility].

## 2023-01-10 NOTE — Progress Notes (Signed)
R hand injury.  Was cranking his weedeater, using a pull crank, then hit dorum R hand on a separate weedeater on the rack behing him.  Pain at the time.  Bruising in the hand and R arm.  Still worked that day.  Lump on the dorsum R hand, at proximal R 3rd MC.  On xarelto.  No other bleeding or bruising.   Grip limited by swelling but ROM intact.  Still working in the meantime. No fevers.  No drainage.    Meds, vitals, and allergies reviewed.   ROS: Per HPI unless specifically indicated in ROS section   Nad Ncat RUE with dorsal > palmar bruising.  4x4cm lump on the dorsum of the hand at injury site.  No ulceration or erythema.  Bruising extends up the R forearm circumferentially.  Normal radial pulse and distal cap refill.  Sensation intact, not ttp on bony prominences and normal ROM on the fingers w/o tendon deficit.

## 2023-01-21 ENCOUNTER — Encounter: Payer: Self-pay | Admitting: Family Medicine

## 2023-01-21 ENCOUNTER — Ambulatory Visit: Payer: Medicare Other | Admitting: Family Medicine

## 2023-01-21 VITALS — BP 124/80 | HR 58 | Temp 98.1°F | Ht 72.0 in | Wt 208.0 lb

## 2023-01-21 DIAGNOSIS — S60221A Contusion of right hand, initial encounter: Secondary | ICD-10-CM | POA: Diagnosis not present

## 2023-01-21 DIAGNOSIS — T148XXA Other injury of unspecified body region, initial encounter: Secondary | ICD-10-CM | POA: Diagnosis not present

## 2023-01-21 NOTE — Progress Notes (Unsigned)
Follow-up for right arm/hand.  Previous bruising on the arm and hand improved. Hand ROM is improved.  D/w pt about elevation.   Still with 4x4 noninfected lump on dorsum R hand.  Not tender locally.  No ulceration.  No spreading erythema.  Meds, vitals, and allergies reviewed.   ROS: Per HPI unless specifically indicated in ROS section   Right arm without bruising.  Normal range of motion at the elbow and wrist.  Normal grip. Still with 4x4 cm noninfected lump on dorsum R hand.  Not tender locally.  No ulceration.  No spreading erythema.  I cut the bandage to fit around the area.  Took 4 x 4's and cut a central hole and stacked those around the lesion and then cover the area with Coban.  That protected the lesion.

## 2023-01-21 NOTE — Patient Instructions (Signed)
No charge for visit.  The swelling should gradually go down.  Take care.  Glad to see you.

## 2023-01-23 NOTE — Assessment & Plan Note (Signed)
Peripheral bruising is significantly better.  Normal range of motion in the hand.  He still has the central hematoma on the dorsum of the hand.  I bandaged as above just protected.  I think trying to drain the area would be counterproductive.  Discussed with patient.  I think it is just going to take more time to absorb.  No charge at the visit and update me as needed.  He agrees to plan.

## 2023-02-03 ENCOUNTER — Other Ambulatory Visit: Payer: Self-pay | Admitting: Family Medicine

## 2023-02-03 DIAGNOSIS — Z125 Encounter for screening for malignant neoplasm of prostate: Secondary | ICD-10-CM

## 2023-02-03 DIAGNOSIS — R739 Hyperglycemia, unspecified: Secondary | ICD-10-CM

## 2023-02-03 DIAGNOSIS — E7800 Pure hypercholesterolemia, unspecified: Secondary | ICD-10-CM

## 2023-02-07 ENCOUNTER — Ambulatory Visit (INDEPENDENT_AMBULATORY_CARE_PROVIDER_SITE_OTHER): Payer: Medicare Other

## 2023-02-07 VITALS — Wt 208.0 lb

## 2023-02-07 DIAGNOSIS — Z0000 Encounter for general adult medical examination without abnormal findings: Secondary | ICD-10-CM | POA: Diagnosis not present
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
## 2023-02-11 ENCOUNTER — Other Ambulatory Visit (INDEPENDENT_AMBULATORY_CARE_PROVIDER_SITE_OTHER): Payer: Medicare Other

## 2023-02-11 DIAGNOSIS — Z125 Encounter for screening for malignant neoplasm of prostate: Secondary | ICD-10-CM

## 2023-02-11 DIAGNOSIS — R739 Hyperglycemia, unspecified: Secondary | ICD-10-CM | POA: Diagnosis not present

## 2023-02-11 DIAGNOSIS — E7800 Pure hypercholesterolemia, unspecified: Secondary | ICD-10-CM | POA: Diagnosis not present

## 2023-02-11 LAB — LIPID PANEL
Cholesterol: 172 mg/dL (ref 0–200)
HDL: 53.7 mg/dL (ref >39.00)
LDL Cholesterol: 103 mg/dL — ABNORMAL HIGH (ref 0–99)
NonHDL: 117.95
Total CHOL/HDL Ratio: 3
Triglycerides: 75 mg/dL (ref 0.0–149.0)
VLDL: 15 mg/dL (ref 0.0–40.0)

## 2023-02-11 LAB — PSA, MEDICARE: PSA: 0.64 ng/mL (ref 0.10–4.00)

## 2023-02-11 LAB — HEMOGLOBIN A1C: Hgb A1c MFr Bld: 5.5 % (ref 4.6–6.5)

## 2023-02-18 ENCOUNTER — Encounter: Payer: Self-pay | Admitting: Family Medicine

## 2023-02-18 ENCOUNTER — Ambulatory Visit (INDEPENDENT_AMBULATORY_CARE_PROVIDER_SITE_OTHER): Payer: Medicare Other | Admitting: Family Medicine

## 2023-02-18 ENCOUNTER — Other Ambulatory Visit: Payer: Medicare Other

## 2023-02-18 VITALS — BP 138/82 | HR 71 | Temp 97.9°F | Ht 72.0 in | Wt 210.4 lb

## 2023-02-18 DIAGNOSIS — F419 Anxiety disorder, unspecified: Secondary | ICD-10-CM

## 2023-02-18 DIAGNOSIS — Z86718 Personal history of other venous thrombosis and embolism: Secondary | ICD-10-CM

## 2023-02-18 DIAGNOSIS — R399 Unspecified symptoms and signs involving the genitourinary system: Secondary | ICD-10-CM | POA: Diagnosis not present

## 2023-02-18 DIAGNOSIS — I10 Essential (primary) hypertension: Secondary | ICD-10-CM

## 2023-02-18 DIAGNOSIS — Z0000 Encounter for general adult medical examination without abnormal findings: Secondary | ICD-10-CM

## 2023-02-18 DIAGNOSIS — Z7189 Other specified counseling: Secondary | ICD-10-CM

## 2023-02-18 MED ORDER — LOSARTAN POTASSIUM 100 MG PO TABS: 100.0000 mg | ORAL_TABLET | Freq: Every day | ORAL | 3 refills | Status: DC

## 2023-02-18 MED ORDER — CLONAZEPAM 0.5 MG PO TABS: ORAL_TABLET | ORAL | 3 refills | Status: DC

## 2023-02-18 MED ORDER — TAMSULOSIN HCL 0.4 MG PO CAPS: 0.4000 mg | ORAL_CAPSULE | Freq: Every day | ORAL | 3 refills | Status: DC

## 2023-02-18 MED ORDER — RIVAROXABAN 20 MG PO TABS: 20.0000 mg | ORAL_TABLET | Freq: Every evening | ORAL | 3 refills | Status: DC

## 2023-02-18 NOTE — Progress Notes (Unsigned)
Mood d/w pt.  Taking klonopin with buspar.  Still on lexapro.  All help.  He is doing better with mood with current meds.  No SI/HI.  "I feel so much different with the meds."    Hypertension:    Using medication without problems or lightheadedness: yes Chest pain with exertion:no Edema:no Short of breath:no Labs d/w pt.    Still on xarelto.  No recent DVT.  No bleeding.   Compliant.  Rationale d/w pt.    Urinary sx d/w pt.  Still on flomax and doxazosin.  Stream is okay.  No burning with urination.  He'll have urology f/u in 2025.    Shingrix prev done Flu shot 2024 PNA up to date.   Tetanus 2016 Covid d/w pt.  RSV d/w pt.   Colonoscopy 2024 PSA wnl 2024 Advance directive- grandson Jakota Viana designated if patient were incapacitated.  AAA screening not due given prev CT abd/pelvis in 2016  Meds, vitals, and allergies reviewed.   PMH and SH reviewed  ROS: Per HPI unless specifically indicated in ROS section   GEN: nad, alert and oriented HEENT: ncat NECK: supple w/o LA CV: rrr. PULM: ctab, no inc wob ABD: soft, +bs EXT: no edema SKIN: no acute rash

## 2023-02-18 NOTE — Patient Instructions (Signed)
Update me as needed.  Take care.  Glad to see you.   

## 2023-02-18 NOTE — Assessment & Plan Note (Signed)
Advance directive- grandson Talus Scarfo designated if patient were incapacitated.

## 2023-02-20 NOTE — Assessment & Plan Note (Signed)
Shingrix prev done Flu shot 2024 PNA up to date.   Tetanus 2016 Covid d/w pt.  RSV d/w pt.   Colonoscopy 2024 PSA wnl 2024 Advance directive- grandson [name] designated if patient were incapacitated.  AAA screening not due given prev CT abd/pelvis in 2016

## 2023-02-20 NOTE — Assessment & Plan Note (Signed)
Taking klonopin with buspar.  Still on lexapro.  All help.  He is doing better with mood with current meds.  No SI/HI.  "I feel so much different with the meds."   He is less anxious and he wanted to continue as is.

## 2023-02-20 NOTE — Assessment & Plan Note (Signed)
Still on flomax and doxazosin.  Stream is okay.  No burning with urination.  He'll have urology f/u in 2025.  Continue as is.

## 2023-02-20 NOTE — Assessment & Plan Note (Signed)
Continue doxazosin and losartan

## 2023-02-20 NOTE — Assessment & Plan Note (Signed)
Still on xarelto.  No recent DVT.  No bleeding.   Compliant.  Rationale d/w pt.

## 2023-03-15 ENCOUNTER — Encounter: Payer: Self-pay | Admitting: Family Medicine

## 2023-03-15 ENCOUNTER — Ambulatory Visit (INDEPENDENT_AMBULATORY_CARE_PROVIDER_SITE_OTHER): Payer: Medicare Other | Admitting: Family Medicine

## 2023-03-15 VITALS — BP 142/82 | HR 65 | Temp 98.1°F | Ht 72.0 in | Wt 208.4 lb

## 2023-03-15 DIAGNOSIS — I10 Essential (primary) hypertension: Secondary | ICD-10-CM

## 2023-03-15 DIAGNOSIS — H6990 Unspecified Eustachian tube disorder, unspecified ear: Secondary | ICD-10-CM | POA: Diagnosis not present

## 2023-03-15 DIAGNOSIS — J069 Acute upper respiratory infection, unspecified: Secondary | ICD-10-CM

## 2023-03-15 NOTE — Patient Instructions (Addendum)
If you get more lightheaded, then you could cut losartan back to 50mg  a day.    Take mucinex (not mucinex D) with plenty of water and update me as needed.  Take care.  Glad to see you.

## 2023-03-15 NOTE — Progress Notes (Unsigned)
He was sitting in the recliner at home Sunday afternoon, 2 days ago.  He had quick onset sx with hot flash for about 10 seconds, then it resolved.  Had some nausea intermittently.  No vomiting.  No fevers.  No abd pain at the time of the hot flash but some concurrent nausea.  No syncope.  Occ mildly lightheaded but the hot flash event was while sitting down.    L ear sx, wanted that checked.  Started taking mucinex in the meantime.  Prev with muffled hearing, some better now.    He doesn't feel unwell now.  He feels "fair."  No R ear sx.  No rhinorrhea, no ST.    Meds, vitals, and allergies reviewed.   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 MMM Ctab RRR Abdomen soft.  Nontender.   Skin well-perfused.

## 2023-03-16 NOTE — Assessment & Plan Note (Signed)
Presumed recent URI that is improved.  Suspect this caused sensation of temperature dysregulation with hot flash.  It also could explain his eustachian tube dysfunction.  Anatomy and pathophysiology discussed with patient.  At this point okay for outpatient follow-up.  He can perform Valsalva gently as needed and update me as needed.  Can use Mucinex as needed with plenty of water.  If he happens to be more lightheaded he can cut losartan back to 50 mg/day.

## 2023-03-16 NOTE — Assessment & Plan Note (Signed)
If he happens to be more lightheaded he can cut losartan back to 50 mg/day.

## 2023-04-19 ENCOUNTER — Other Ambulatory Visit: Payer: Self-pay | Admitting: Family Medicine

## 2023-05-09 DIAGNOSIS — Z961 Presence of intraocular lens: Secondary | ICD-10-CM | POA: Diagnosis not present

## 2023-05-09 DIAGNOSIS — H43811 Vitreous degeneration, right eye: Secondary | ICD-10-CM | POA: Diagnosis not present

## 2023-05-09 DIAGNOSIS — H401121 Primary open-angle glaucoma, left eye, mild stage: Secondary | ICD-10-CM | POA: Diagnosis not present

## 2023-05-09 DIAGNOSIS — H40011 Open angle with borderline findings, low risk, right eye: Secondary | ICD-10-CM | POA: Diagnosis not present

## 2023-06-08 DIAGNOSIS — I10 Essential (primary) hypertension: Secondary | ICD-10-CM | POA: Diagnosis not present

## 2023-06-08 DIAGNOSIS — R339 Retention of urine, unspecified: Secondary | ICD-10-CM | POA: Diagnosis not present

## 2023-06-08 DIAGNOSIS — Z86718 Personal history of other venous thrombosis and embolism: Secondary | ICD-10-CM | POA: Diagnosis not present

## 2023-06-08 DIAGNOSIS — D6851 Activated protein C resistance: Secondary | ICD-10-CM | POA: Diagnosis not present

## 2023-06-08 DIAGNOSIS — Z7901 Long term (current) use of anticoagulants: Secondary | ICD-10-CM | POA: Diagnosis not present

## 2023-06-08 DIAGNOSIS — H8110 Benign paroxysmal vertigo, unspecified ear: Secondary | ICD-10-CM | POA: Diagnosis not present

## 2023-06-08 NOTE — Progress Notes (Signed)
 Assessment: BPH. S/p TURP [place]) 01/04/22. Complicated by post-op gross hematuria requiring hospital admission and CBI. On Flomax .  -PVR 182 ml today.  We discussed that this is much improved compared to his last PVR.  I do recommend rescheduling his renal ultrasound and checking a BMP today.  DVT on Eliquis.  Factor V Leiden.   Plan: -Check BMP today. -Will reschedule renal ultrasound and contact patient with results. -Continue with Flomax  0.4 mg daily. -Continue with double voiding and timed voiding.  If renal ultrasound results are normal can return to the clinic in 6 months.  Toya Cassette, MPAP, PA-C [facility], [place]  06/08/23 Phone: [phone] Pager: [phone]   Referring Provider: Dr. Mitch   HPI:  78 y.o. male    Patient w history of Factor V Leiden, DVT on Xarelto , HTN, BPPV and BPH who is s/p TURP with Dr. Mitch on 01/04/22. He was re-admitted on 10/3 for post-op hematuria and required CBI. Xarelto  was held and hematuria resolved. He was started on heparin  prior to discharge and urine remained clear. He was discharged on Lovenox  bridge on 10/7. He presented [facility] ER 10/13 with hematuria. He failed TOV on 10/16 and was discharged with foley.   He was seen in the clinic on 10/23 for TOV which he passed.   Interval history (03/04/22): He is here today for post-op check.  He last episode of gross hematuria was last week.  Has been doing well.  Reports good FOS.  Still taking Flomax  daily.  No straining to void or incomplete emptying.  No constipation.  No fever, chills or dysuria.   Cr 1.00 (02/08/2022).   PVR today 265 ml.   Interval history (06/03/22): Here for f/up of elevated PVR after TURP.  Continues on Flomax .  Today he reports doing well.  No gross hematuria, dysuria, fever, chills or abdominal pain.  Has not had any infections since his last visit.   PVR 238 ml.   Interval history  (11/10/22): Presents today for 6 month follow-up.  He continues with double voiding and is taking Flomax  daily.  Feels that he has to push on his bladder in order to empty more.  Denies any infections or flank pain since his last visit.  Denies fever, chills, gross hematuria or dysuria.   PVR today 291 ml.   Interval history (06/08/23): He presents today for 44-month follow-up of urinary symptoms. He continues taking Flomax  0.4 mg daily and reports he has been double voiding. Feels that he is now able to empty his bladder more completely than he was prior. Denies any gross hematuria, dysuria, fever or chills.  He has not had any recent lab work done.  He was scheduled to have a renal ultrasound done today but states that he did not get a reminder.  PVR 182 ml   Past history reviewed and unchanged  ROS:  A comprehensive 10-system review was negative, except as noted in HPI. The patient was asked to review all abnormal responses not pertinent to today's visit with their primary care physician.  BP 148/91 (BP Site: L Arm, BP Position: Sitting, BP Cuff Size: Large)   Pulse 67   Physical Exam: General: well developed, well nourished, no acute distress HEENT: PERLA, EOM intact, normocephalic, atraumatic Neck: supple and no masses Chest: symmetrical Lungs: non-labored breathing Heart: normal rhythm, no JVD Abdomen: no tenderness, no masses or hernias, no palpable organomegaly GU: voiding spontaneously  Extremities: no deformities, no edema,  no cyanosis

## 2023-06-09 NOTE — Progress Notes (Signed)
 Spoke to patient. Informed him that his lab work was normal.

## 2023-06-20 DIAGNOSIS — N323 Diverticulum of bladder: Secondary | ICD-10-CM | POA: Diagnosis not present

## 2023-06-20 DIAGNOSIS — N281 Cyst of kidney, acquired: Secondary | ICD-10-CM | POA: Diagnosis not present

## 2023-06-20 DIAGNOSIS — N320 Bladder-neck obstruction: Secondary | ICD-10-CM | POA: Diagnosis not present

## 2023-06-20 DIAGNOSIS — N401 Enlarged prostate with lower urinary tract symptoms: Secondary | ICD-10-CM | POA: Diagnosis not present

## 2023-06-20 DIAGNOSIS — N3289 Other specified disorders of bladder: Secondary | ICD-10-CM | POA: Diagnosis not present

## 2023-06-20 DIAGNOSIS — N400 Enlarged prostate without lower urinary tract symptoms: Secondary | ICD-10-CM | POA: Diagnosis not present

## 2023-06-20 DIAGNOSIS — R339 Retention of urine, unspecified: Secondary | ICD-10-CM | POA: Diagnosis not present

## 2023-07-29 ENCOUNTER — Other Ambulatory Visit: Payer: Self-pay | Admitting: Family Medicine

## 2023-08-01 DIAGNOSIS — K080 Exfoliation of teeth due to systemic causes: Secondary | ICD-10-CM | POA: Diagnosis not present

## 2023-08-22 DIAGNOSIS — D6869 Other thrombophilia: Secondary | ICD-10-CM | POA: Diagnosis not present

## 2023-08-22 DIAGNOSIS — Z7982 Long term (current) use of aspirin: Secondary | ICD-10-CM | POA: Diagnosis not present

## 2023-08-23 DIAGNOSIS — D225 Melanocytic nevi of trunk: Secondary | ICD-10-CM | POA: Diagnosis not present

## 2023-08-23 DIAGNOSIS — Z85828 Personal history of other malignant neoplasm of skin: Secondary | ICD-10-CM | POA: Diagnosis not present

## 2023-08-23 DIAGNOSIS — D2262 Melanocytic nevi of left upper limb, including shoulder: Secondary | ICD-10-CM | POA: Diagnosis not present

## 2023-08-23 DIAGNOSIS — L570 Actinic keratosis: Secondary | ICD-10-CM | POA: Diagnosis not present

## 2023-08-23 DIAGNOSIS — D2261 Melanocytic nevi of right upper limb, including shoulder: Secondary | ICD-10-CM | POA: Diagnosis not present

## 2023-09-06 DIAGNOSIS — H43811 Vitreous degeneration, right eye: Secondary | ICD-10-CM | POA: Diagnosis not present

## 2023-09-06 DIAGNOSIS — H401121 Primary open-angle glaucoma, left eye, mild stage: Secondary | ICD-10-CM | POA: Diagnosis not present

## 2023-09-06 DIAGNOSIS — H40011 Open angle with borderline findings, low risk, right eye: Secondary | ICD-10-CM | POA: Diagnosis not present

## 2023-09-10 ENCOUNTER — Other Ambulatory Visit: Payer: Self-pay | Admitting: Family Medicine

## 2023-10-10 ENCOUNTER — Inpatient Hospital Stay: Payer: Medicare Other | Attending: Internal Medicine

## 2023-10-10 ENCOUNTER — Inpatient Hospital Stay: Payer: Medicare Other | Admitting: Internal Medicine

## 2023-10-10 ENCOUNTER — Encounter: Payer: Self-pay | Admitting: Internal Medicine

## 2023-10-10 VITALS — BP 121/79 | HR 52 | Temp 97.8°F | Resp 18 | Ht 72.0 in | Wt 212.6 lb

## 2023-10-10 DIAGNOSIS — Z86718 Personal history of other venous thrombosis and embolism: Secondary | ICD-10-CM | POA: Diagnosis not present

## 2023-10-10 DIAGNOSIS — Z8042 Family history of malignant neoplasm of prostate: Secondary | ICD-10-CM | POA: Insufficient documentation

## 2023-10-10 DIAGNOSIS — Z87891 Personal history of nicotine dependence: Secondary | ICD-10-CM | POA: Insufficient documentation

## 2023-10-10 DIAGNOSIS — D6851 Activated protein C resistance: Secondary | ICD-10-CM | POA: Diagnosis not present

## 2023-10-10 DIAGNOSIS — Z806 Family history of leukemia: Secondary | ICD-10-CM | POA: Diagnosis not present

## 2023-10-10 DIAGNOSIS — Z800 Family history of malignant neoplasm of digestive organs: Secondary | ICD-10-CM | POA: Insufficient documentation

## 2023-10-10 LAB — CBC WITH DIFFERENTIAL (CANCER CENTER ONLY)
Abs Immature Granulocytes: 0.02 K/uL (ref 0.00–0.07)
Basophils Absolute: 0.1 K/uL (ref 0.0–0.1)
Basophils Relative: 1 %
Eosinophils Absolute: 0.5 K/uL (ref 0.0–0.5)
Eosinophils Relative: 12 %
HCT: 42.1 % (ref 39.0–52.0)
Hemoglobin: 14.2 g/dL (ref 13.0–17.0)
Immature Granulocytes: 1 %
Lymphocytes Relative: 20 %
Lymphs Abs: 0.8 K/uL (ref 0.7–4.0)
MCH: 29.3 pg (ref 26.0–34.0)
MCHC: 33.7 g/dL (ref 30.0–36.0)
MCV: 87 fL (ref 80.0–100.0)
Monocytes Absolute: 0.4 K/uL (ref 0.1–1.0)
Monocytes Relative: 9 %
Neutro Abs: 2.4 K/uL (ref 1.7–7.7)
Neutrophils Relative %: 57 %
Platelet Count: 182 K/uL (ref 150–400)
RBC: 4.84 MIL/uL (ref 4.22–5.81)
RDW: 12.5 % (ref 11.5–15.5)
WBC Count: 4.1 K/uL (ref 4.0–10.5)
nRBC: 0 % (ref 0.0–0.2)

## 2023-10-10 LAB — CMP (CANCER CENTER ONLY)
ALT: 25 U/L (ref 0–44)
AST: 21 U/L (ref 15–41)
Albumin: 4 g/dL (ref 3.5–5.0)
Alkaline Phosphatase: 63 U/L (ref 38–126)
Anion gap: 6 (ref 5–15)
BUN: 15 mg/dL (ref 8–23)
CO2: 26 mmol/L (ref 22–32)
Calcium: 8.5 mg/dL — ABNORMAL LOW (ref 8.9–10.3)
Chloride: 107 mmol/L (ref 98–111)
Creatinine: 1.09 mg/dL (ref 0.61–1.24)
GFR, Estimated: >60 mL/min (ref >60)
Glucose, Bld: 108 mg/dL — ABNORMAL HIGH (ref 70–99)
Potassium: 3.8 mmol/L (ref 3.5–5.1)
Sodium: 139 mmol/L (ref 135–145)
Total Bilirubin: 0.7 mg/dL (ref 0.0–1.2)
Total Protein: 6.4 g/dL — ABNORMAL LOW (ref 6.5–8.1)
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
## 2023-10-10 NOTE — Patient Instructions (Signed)
#   Recommend caltrate or similar composition [calcium  600 plus vitamin D-3 -800] -over-the-counter-2 pills a day.

## 2023-10-10 NOTE — Progress Notes (Signed)
 Pt fell off a ladder x1 week ago, lt rib soreness.

## 2023-10-10 NOTE — Assessment & Plan Note (Addendum)
#   heterozygosity of Factor V Leiden, recurrent left lower extremity DVT, and May-Thurner syndrome-currently on indefinite anticoagulation with Xarelto   No signs of any progression/recurrence.   No Bleeding noted.  Patient will follow-up with PCP for further refills.  # Prostatism symptoms: currently s/p evaluation with Urology [re-evaluation with Dr.Freidlander].  stable.  # LOW calcium: Discussed regarding low calcium-and overall importance in bone health. Recommend caltrate or similar composition [calcium  600 plus vitamin D-3 -800] -over-the-counter-2 pills a day.   # I had a long discussion with the patient regarding the risk of bleeding while on anticoagulation.  Patient counseled regarding taking at most care to avoid falls/hitting head. The patient should inform us  of any bleeding episodes; or report to emergency room if any significant bleeding is noticed.  #Since patient is clinically stable I think is reasonable for the patient to follow-up with PCP/can follow-up with us  as needed.  Patient comfortable with the plan; to call us  if any questions or concerns in the interim.   # DISPOSITION: #  follow up as needed-  Dr.B.

## 2023-11-04 ENCOUNTER — Other Ambulatory Visit: Payer: Self-pay

## 2023-11-04 DIAGNOSIS — I871 Compression of vein: Secondary | ICD-10-CM

## 2023-11-14 ENCOUNTER — Other Ambulatory Visit: Payer: Self-pay | Admitting: Family Medicine

## 2023-11-14 NOTE — Telephone Encounter (Signed)
 LOV: 03/15/2023 NOV:02/20/2024 Last Refill: escitalopram  (LEXAPRO ) 10 MG tablet  060/12/2023 90 tablets 0 refills

## 2023-11-16 ENCOUNTER — Other Ambulatory Visit: Payer: Self-pay | Admitting: Family Medicine

## 2023-11-23 ENCOUNTER — Ambulatory Visit
Admission: RE | Admit: 2023-11-23 | Discharge: 2023-11-23 | Disposition: A | Discharge status: Home / Self Care | Source: Ambulatory Visit

## 2023-11-23 DIAGNOSIS — I871 Compression of vein: Secondary | ICD-10-CM
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
## 2023-11-29 ENCOUNTER — Other Ambulatory Visit: Payer: Self-pay | Admitting: Family Medicine

## 2023-11-29 DIAGNOSIS — F419 Anxiety disorder, unspecified: Secondary | ICD-10-CM

## 2023-12-14 DIAGNOSIS — N400 Enlarged prostate without lower urinary tract symptoms: Secondary | ICD-10-CM | POA: Diagnosis not present

## 2024-01-09 ENCOUNTER — Other Ambulatory Visit: Payer: Self-pay | Admitting: Family Medicine

## 2024-01-09 DIAGNOSIS — F419 Anxiety disorder, unspecified: Secondary | ICD-10-CM

## 2024-01-09 NOTE — Telephone Encounter (Signed)
 Name of Medication:  Clonazepam  Name of Pharmacy:  [facility] Last Caro or Written Date and Quantity:  11/29/23, #40 Last Office Visit and Type:  03/15/23, ear & GI issues Next Office Visit and Type:  02/20/24, annual exam Last Controlled Substance Agreement Date:  none Last UDS:  none

## 2024-01-23 DIAGNOSIS — H43811 Vitreous degeneration, right eye: Secondary | ICD-10-CM | POA: Diagnosis not present

## 2024-01-23 DIAGNOSIS — H401121 Primary open-angle glaucoma, left eye, mild stage: Secondary | ICD-10-CM | POA: Diagnosis not present

## 2024-01-23 DIAGNOSIS — H40011 Open angle with borderline findings, low risk, right eye: Secondary | ICD-10-CM | POA: Diagnosis not present

## 2024-02-14 ENCOUNTER — Ambulatory Visit: Payer: Medicare Other

## 2024-02-14 VITALS — Ht 72.0 in | Wt 212.0 lb

## 2024-02-14 DIAGNOSIS — Z0000 Encounter for general adult medical examination without abnormal findings: Secondary | ICD-10-CM | POA: Diagnosis not present
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
## 2024-02-14 NOTE — Patient Instructions (Signed)
 [name],  Thank you for taking the time for your Medicare Wellness Visit. I appreciate your continued commitment to your health goals. Please review the care plan we discussed, and feel free to reach out if I can assist you further.  Please note that Annual Wellness Visits do not include a physical exam. Some assessments may be limited, especially if the visit was conducted virtually. If needed, we may recommend an in-person follow-up with your provider.  Ongoing Care Seeing your primary care provider every 3 to 6 months helps us  monitor your health and provide consistent, personalized care.   Referrals If a referral was made during today's visit and you haven't received any updates within two weeks, please contact the referred provider directly to check on the status.  Recommended Screenings:  Health Maintenance  Topic Date Due   Medicare Annual Wellness Visit  02/07/2024   DTaP/Tdap/Td vaccine (5 - Td or Tdap) 02/03/2025   Pneumococcal Vaccine for age over 15  Completed   Flu Shot  Completed   Hepatitis C Screening  Completed   Zoster (Shingles) Vaccine  Completed   Meningitis B Vaccine  Aged Out   Colon Cancer Screening  Discontinued   COVID-19 Vaccine  Discontinued       10/10/2023    8:47 AM  Advanced Directives  Does Patient Have a Medical Advance Directive? No  Would patient like information on creating a medical advance directive? No - Patient declined    Vision: Annual vision screenings are recommended for early detection of glaucoma, cataracts, and diabetic retinopathy. These exams can also reveal signs of chronic conditions such as diabetes and high blood pressure.  Dental: Annual dental screenings help detect early signs of oral cancer, gum disease, and other conditions linked to overall health, including heart disease and diabetes.

## 2024-02-17 DIAGNOSIS — K080 Exfoliation of teeth due to systemic causes: Secondary | ICD-10-CM | POA: Diagnosis not present

## 2024-02-20 ENCOUNTER — Ambulatory Visit: Admitting: Family Medicine

## 2024-02-20 ENCOUNTER — Encounter: Payer: Self-pay | Admitting: Family Medicine

## 2024-02-20 VITALS — BP 120/84 | HR 62 | Temp 98.0°F | Ht 71.0 in | Wt 207.5 lb

## 2024-02-20 DIAGNOSIS — Z125 Encounter for screening for malignant neoplasm of prostate: Secondary | ICD-10-CM

## 2024-02-20 DIAGNOSIS — I10 Essential (primary) hypertension: Secondary | ICD-10-CM

## 2024-02-20 DIAGNOSIS — D6851 Activated protein C resistance: Secondary | ICD-10-CM

## 2024-02-20 DIAGNOSIS — F419 Anxiety disorder, unspecified: Secondary | ICD-10-CM

## 2024-02-20 DIAGNOSIS — N400 Enlarged prostate without lower urinary tract symptoms: Secondary | ICD-10-CM

## 2024-02-20 DIAGNOSIS — Z7189 Other specified counseling: Secondary | ICD-10-CM

## 2024-02-20 DIAGNOSIS — Z0000 Encounter for general adult medical examination without abnormal findings: Secondary | ICD-10-CM

## 2024-02-20 DIAGNOSIS — K219 Gastro-esophageal reflux disease without esophagitis: Secondary | ICD-10-CM | POA: Diagnosis not present

## 2024-02-20 LAB — LIPID PANEL
Cholesterol: 177 mg/dL (ref 0–200)
HDL: 49.3 mg/dL (ref >39.00)
LDL Cholesterol: 107 mg/dL — ABNORMAL HIGH (ref 0–99)
NonHDL: 127.56
Total CHOL/HDL Ratio: 4
Triglycerides: 105 mg/dL (ref 0.0–149.0)
VLDL: 21 mg/dL (ref 0.0–40.0)

## 2024-02-20 LAB — BASIC METABOLIC PANEL WITH GFR
BUN: 12 mg/dL (ref 6–23)
CO2: 28 meq/L (ref 19–32)
Calcium: 8.9 mg/dL (ref 8.4–10.5)
Chloride: 107 meq/L (ref 96–112)
Creatinine, Ser: 1.05 mg/dL (ref 0.40–1.50)
GFR: 68.82 mL/min (ref >60.00)
Glucose, Bld: 105 mg/dL — ABNORMAL HIGH (ref 70–99)
Potassium: 4.1 meq/L (ref 3.5–5.1)
Sodium: 142 meq/L (ref 135–145)

## 2024-02-20 LAB — PSA, MEDICARE: PSA: 0.55 ng/mL (ref 0.10–4.00)

## 2024-02-20 LAB — VITAMIN D 25 HYDROXY (VIT D DEFICIENCY, FRACTURES): VITD: 28.61 ng/mL — ABNORMAL LOW (ref 30.00–100.00)

## 2024-02-20 MED ORDER — ESCITALOPRAM OXALATE 10 MG PO TABS: 10.0000 mg | ORAL_TABLET | Freq: Every day | ORAL | 3 refills | Status: AC

## 2024-02-20 MED ORDER — RIVAROXABAN 20 MG PO TABS: 20.0000 mg | ORAL_TABLET | Freq: Every evening | ORAL | 3 refills | Status: AC

## 2024-02-20 MED ORDER — BUSPIRONE HCL 10 MG PO TABS: 20.0000 mg | ORAL_TABLET | Freq: Two times a day (BID) | ORAL | 3 refills | Status: AC

## 2024-02-20 MED ORDER — PANTOPRAZOLE SODIUM 40 MG PO TBEC: 40.0000 mg | DELAYED_RELEASE_TABLET | Freq: Two times a day (BID) | ORAL | 3 refills | Status: AC

## 2024-02-20 MED ORDER — LOSARTAN POTASSIUM 100 MG PO TABS: 100.0000 mg | ORAL_TABLET | Freq: Every day | ORAL | 3 refills | Status: AC

## 2024-02-20 MED ORDER — TURMERIC 500 MG PO CAPS: 500.0000 mg | ORAL_CAPSULE | Freq: Every day | ORAL | Status: DC

## 2024-02-20 MED ORDER — DOXAZOSIN MESYLATE 1 MG PO TABS: ORAL_TABLET | ORAL | 3 refills | Status: AC

## 2024-02-20 MED ORDER — CALCIUM CARB-CHOLECALCIFEROL 600-20 MG-MCG PO TABS: 1.0000 | ORAL_TABLET | Freq: Every day | ORAL | Status: AC

## 2024-02-20 MED ORDER — TAMSULOSIN HCL 0.4 MG PO CAPS: 0.4000 mg | ORAL_CAPSULE | Freq: Every day | ORAL | 3 refills | Status: AC

## 2024-02-20 MED ORDER — TURMERIC 500 MG PO CAPS: 1000.0000 mg | ORAL_CAPSULE | Freq: Two times a day (BID) | ORAL | Status: AC

## 2024-02-20 NOTE — Patient Instructions (Addendum)
Go to the lab on the way out.   If you have mychart we'll likely use that to update you.    °Take care.  Glad to see you. °Update me as needed.   °

## 2024-02-20 NOTE — Assessment & Plan Note (Signed)
 Still on xarelto .  No recent DVT.  No bleeding.   Compliant.   Continue as is.

## 2024-02-20 NOTE — Progress Notes (Signed)
 Mood d/w pt.  Taking klonopin  with buspar .  Still on lexapro .  All still help.  He is doing better with mood with current meds.  No SI/HI. Using klonopin  in the afternoons with relief.    Hypertension:               Using medication without problems or lightheadedness: rarely lightheaded with sig position change.  Cautions d/w pt.   Chest pain with exertion:no Edema:no Short of breath:no  Taking turmeric daily helped with knee and joint pain.    GERD controlled with PPI.    H/o low calcium. Taking caltrate 1 tab a day.  Recheck labs pending.    H/o DVT.  Still on xarelto .  No recent DVT.  No bleeding.   Compliant.     Urinary sx d/w pt.  Still on flomax  and doxazosin .  Stream is still okay.  No burning with urination.  Had urology f/u in 2025.     Shingrix prev done Flu shot 2025 PNA up to date.   Tetanus 2016 Covid 2025.   RSV prev done at pharmacy.  [facility] [facility].     Colonoscopy 2024 PSA pending 2025.   Advance directive- grandson Livingston Denner designated if patient were incapacitated.  AAA screening not due given prev CT abd/pelvis in 2016   Meds, vitals, and allergies reviewed.    PMH and SH reviewed   ROS: Per HPI unless specifically indicated in ROS section    GEN: nad, alert and oriented HEENT: ncat NECK: supple w/o LA CV: rrr. PULM: ctab, no inc wob ABD: soft, +bs EXT: no edema SKIN: Well-perfused.

## 2024-02-20 NOTE — Assessment & Plan Note (Signed)
Continue PPI as is. 

## 2024-02-20 NOTE — Assessment & Plan Note (Signed)
 Taking klonopin  with buspar .  Still on lexapro .  All still help. Continue as is.

## 2024-02-20 NOTE — Assessment & Plan Note (Signed)
 Advance directive- grandson Talus Scarfo designated if patient were incapacitated.

## 2024-02-20 NOTE — Assessment & Plan Note (Signed)
 Continue flomax and doxazosin

## 2024-02-20 NOTE — Assessment & Plan Note (Signed)
 Taking caltrate 1 tab a day.  Recheck labs pending.

## 2024-02-20 NOTE — Assessment & Plan Note (Signed)
 Continue doxazosin  with losartan , rarely lightheaded with sig position change.  Cautions d/w pt.

## 2024-02-20 NOTE — Assessment & Plan Note (Signed)
  Shingrix prev done Flu shot 2025 PNA up to date.   Tetanus 2016 Covid 2025.   RSV prev done at pharmacy.  [facility] [place].     Colonoscopy 2024 PSA pending 2025.   Advance directive- grandson Armonte Tortorella designated if patient were incapacitated.  AAA screening not due given prev CT abd/pelvis in 2016

## 2024-02-26 ENCOUNTER — Ambulatory Visit: Payer: Self-pay | Admitting: Family Medicine

## 2024-02-26 ENCOUNTER — Other Ambulatory Visit: Payer: Self-pay | Admitting: Family Medicine

## 2024-02-26 DIAGNOSIS — E559 Vitamin D deficiency, unspecified: Secondary | ICD-10-CM

## 2024-02-26 MED ORDER — VITAMIN D3 25 MCG (1000 UT) PO CAPS: 1000.0000 [IU] | ORAL_CAPSULE | Freq: Every day | ORAL | Status: AC

## 2024-02-27 DIAGNOSIS — Z85828 Personal history of other malignant neoplasm of skin: Secondary | ICD-10-CM | POA: Diagnosis not present

## 2024-02-27 DIAGNOSIS — D0439 Carcinoma in situ of skin of other parts of face: Secondary | ICD-10-CM | POA: Diagnosis not present

## 2024-02-27 DIAGNOSIS — L570 Actinic keratosis: Secondary | ICD-10-CM | POA: Diagnosis not present

## 2024-02-28 NOTE — Telephone Encounter (Signed)
 Copied from CRM #[id number]. Topic: Clinical - Lab/Test Results >> Feb 28, 2024 10:28 AM Dedra B wrote: Reason for CRM: Pt returning call regarding lab results. Relayed message verbatim. Pt verbalized understanding and had no further questions. He said he will get some some Vitamin D .

## 2024-03-06 DIAGNOSIS — K080 Exfoliation of teeth due to systemic causes: Secondary | ICD-10-CM | POA: Diagnosis not present

## 2025-02-21 ENCOUNTER — Ambulatory Visit

## 2025-02-21 ENCOUNTER — Encounter: Admitting: Family Medicine
